# Patient Record
Sex: Male | Born: 1935 | Race: White | Hispanic: No | Marital: Married | Smoking: Never smoker
Health system: Southern US, Community
[De-identification: ages and names within clinical notes are randomized; demographics above are authoritative.]

## PROBLEM LIST (undated history)

## (undated) DIAGNOSIS — I779 Disorder of arteries and arterioles, unspecified: Secondary | ICD-10-CM

## (undated) DIAGNOSIS — I739 Peripheral vascular disease, unspecified: Secondary | ICD-10-CM

## (undated) DIAGNOSIS — R413 Other amnesia: Secondary | ICD-10-CM

## (undated) DIAGNOSIS — G459 Transient cerebral ischemic attack, unspecified: Secondary | ICD-10-CM

## (undated) DIAGNOSIS — M1990 Unspecified osteoarthritis, unspecified site: Secondary | ICD-10-CM

## (undated) DIAGNOSIS — F0390 Unspecified dementia without behavioral disturbance: Secondary | ICD-10-CM

## (undated) DIAGNOSIS — D693 Immune thrombocytopenic purpura: Secondary | ICD-10-CM

## (undated) DIAGNOSIS — I4510 Unspecified right bundle-branch block: Secondary | ICD-10-CM

## (undated) DIAGNOSIS — I10 Essential (primary) hypertension: Secondary | ICD-10-CM

## (undated) DIAGNOSIS — K7460 Unspecified cirrhosis of liver: Secondary | ICD-10-CM

## (undated) DIAGNOSIS — I639 Cerebral infarction, unspecified: Secondary | ICD-10-CM

## (undated) DIAGNOSIS — I219 Acute myocardial infarction, unspecified: Secondary | ICD-10-CM

## (undated) DIAGNOSIS — I2510 Atherosclerotic heart disease of native coronary artery without angina pectoris: Secondary | ICD-10-CM

## (undated) DIAGNOSIS — K219 Gastro-esophageal reflux disease without esophagitis: Secondary | ICD-10-CM

## (undated) DIAGNOSIS — B1920 Unspecified viral hepatitis C without hepatic coma: Secondary | ICD-10-CM

## (undated) DIAGNOSIS — S82201A Unspecified fracture of shaft of right tibia, initial encounter for closed fracture: Secondary | ICD-10-CM

## (undated) DIAGNOSIS — D696 Thrombocytopenia, unspecified: Secondary | ICD-10-CM

## (undated) DIAGNOSIS — E785 Hyperlipidemia, unspecified: Secondary | ICD-10-CM

## (undated) HISTORY — DX: Transient cerebral ischemic attack, unspecified: G45.9

## (undated) HISTORY — PX: JOINT REPLACEMENT: SHX530

## (undated) HISTORY — DX: Thrombocytopenia, unspecified: D69.6

## (undated) HISTORY — DX: Essential (primary) hypertension: I10

## (undated) HISTORY — PX: UPPER GASTROINTESTINAL ENDOSCOPY: SHX188

## (undated) HISTORY — DX: Unspecified right bundle-branch block: I45.10

## (undated) HISTORY — PX: EYE SURGERY: SHX253

## (undated) HISTORY — DX: Atherosclerotic heart disease of native coronary artery without angina pectoris: I25.10

## (undated) HISTORY — DX: Unspecified cirrhosis of liver: K74.60

## (undated) HISTORY — DX: Other amnesia: R41.3

## (undated) HISTORY — DX: Disorder of arteries and arterioles, unspecified: I77.9

## (undated) HISTORY — DX: Gastro-esophageal reflux disease without esophagitis: K21.9

## (undated) HISTORY — DX: Peripheral vascular disease, unspecified: I73.9

## (undated) HISTORY — PX: CORONARY ARTERY BYPASS GRAFT: SHX141

## (undated) HISTORY — DX: Hyperlipidemia, unspecified: E78.5

## (undated) HISTORY — DX: Unspecified viral hepatitis C without hepatic coma: B19.20

## (undated) HISTORY — DX: Immune thrombocytopenic purpura: D69.3

## (undated) HISTORY — DX: Unspecified fracture of shaft of right tibia, initial encounter for closed fracture: S82.201A

---

## 1979-06-12 HISTORY — PX: FRACTURE SURGERY: SHX138

## 1994-06-11 DIAGNOSIS — I219 Acute myocardial infarction, unspecified: Secondary | ICD-10-CM

## 1994-06-11 HISTORY — DX: Acute myocardial infarction, unspecified: I21.9

## 1997-10-18 ENCOUNTER — Encounter: Admission: RE | Admit: 1997-10-18 | Discharge: 1998-01-16 | Payer: Self-pay | Admitting: Radiation Oncology

## 1998-07-29 ENCOUNTER — Encounter: Payer: Self-pay | Admitting: Cardiology

## 1998-07-29 ENCOUNTER — Inpatient Hospital Stay (HOSPITAL_COMMUNITY): Admission: EM | Admit: 1998-07-29 | Discharge: 1998-08-03 | Payer: Self-pay | Admitting: Emergency Medicine

## 1998-08-02 ENCOUNTER — Encounter: Payer: Self-pay | Admitting: Cardiology

## 2001-08-21 ENCOUNTER — Encounter: Admission: RE | Admit: 2001-08-21 | Discharge: 2001-08-21 | Payer: Self-pay | Admitting: Neurosurgery

## 2001-08-21 ENCOUNTER — Encounter: Payer: Self-pay | Admitting: Neurosurgery

## 2001-09-02 ENCOUNTER — Encounter: Payer: Self-pay | Admitting: Neurosurgery

## 2001-09-02 ENCOUNTER — Encounter: Admission: RE | Admit: 2001-09-02 | Discharge: 2001-09-02 | Payer: Self-pay | Admitting: Neurosurgery

## 2001-09-16 ENCOUNTER — Encounter: Admission: RE | Admit: 2001-09-16 | Discharge: 2001-09-16 | Payer: Self-pay | Admitting: Neurosurgery

## 2001-09-16 ENCOUNTER — Encounter: Payer: Self-pay | Admitting: Neurosurgery

## 2002-02-17 ENCOUNTER — Encounter: Payer: Self-pay | Admitting: Emergency Medicine

## 2002-02-17 ENCOUNTER — Inpatient Hospital Stay (HOSPITAL_COMMUNITY): Admission: EM | Admit: 2002-02-17 | Discharge: 2002-02-21 | Payer: Self-pay | Admitting: Emergency Medicine

## 2002-02-20 ENCOUNTER — Encounter: Payer: Self-pay | Admitting: Gastroenterology

## 2002-12-01 ENCOUNTER — Ambulatory Visit (HOSPITAL_COMMUNITY): Admission: RE | Admit: 2002-12-01 | Discharge: 2002-12-01 | Payer: Self-pay | Admitting: Orthopedic Surgery

## 2003-04-30 ENCOUNTER — Other Ambulatory Visit: Payer: Self-pay

## 2004-03-21 ENCOUNTER — Ambulatory Visit: Payer: Self-pay | Admitting: Chiropractor

## 2004-04-10 ENCOUNTER — Ambulatory Visit: Payer: Self-pay | Admitting: Physician Assistant

## 2004-07-04 ENCOUNTER — Inpatient Hospital Stay (HOSPITAL_COMMUNITY): Admission: EM | Admit: 2004-07-04 | Discharge: 2004-07-07 | Payer: Self-pay | Admitting: *Deleted

## 2004-07-04 ENCOUNTER — Ambulatory Visit: Payer: Self-pay | Admitting: Cardiology

## 2004-07-13 ENCOUNTER — Ambulatory Visit: Payer: Self-pay

## 2004-07-20 ENCOUNTER — Ambulatory Visit: Payer: Self-pay | Admitting: Cardiology

## 2004-11-02 ENCOUNTER — Ambulatory Visit: Payer: Self-pay | Admitting: Cardiology

## 2005-04-11 ENCOUNTER — Ambulatory Visit: Payer: Self-pay

## 2005-04-13 ENCOUNTER — Ambulatory Visit: Payer: Self-pay | Admitting: Cardiology

## 2005-05-16 ENCOUNTER — Ambulatory Visit: Payer: Self-pay | Admitting: Cardiology

## 2005-12-10 ENCOUNTER — Ambulatory Visit: Payer: Self-pay | Admitting: Unknown Physician Specialty

## 2006-05-17 ENCOUNTER — Ambulatory Visit: Payer: Self-pay | Admitting: Cardiology

## 2006-05-29 ENCOUNTER — Ambulatory Visit: Payer: Self-pay

## 2006-07-16 ENCOUNTER — Ambulatory Visit: Payer: Self-pay | Admitting: Cardiovascular Disease

## 2006-07-16 ENCOUNTER — Inpatient Hospital Stay (HOSPITAL_COMMUNITY): Admission: EM | Admit: 2006-07-16 | Discharge: 2006-07-19 | Payer: Self-pay | Admitting: Emergency Medicine

## 2006-08-22 ENCOUNTER — Ambulatory Visit: Payer: Self-pay | Admitting: Cardiology

## 2006-11-15 ENCOUNTER — Ambulatory Visit: Payer: Self-pay | Admitting: Cardiology

## 2007-02-26 ENCOUNTER — Ambulatory Visit: Payer: Self-pay | Admitting: Unknown Physician Specialty

## 2007-03-08 ENCOUNTER — Ambulatory Visit: Payer: Self-pay | Admitting: Neurology

## 2007-12-11 ENCOUNTER — Ambulatory Visit: Payer: Self-pay | Admitting: Cardiology

## 2007-12-18 ENCOUNTER — Ambulatory Visit: Payer: Self-pay

## 2008-06-29 ENCOUNTER — Ambulatory Visit: Payer: Self-pay

## 2008-11-09 ENCOUNTER — Ambulatory Visit: Payer: Medicare Other | Admitting: Oncology

## 2008-12-08 ENCOUNTER — Ambulatory Visit: Payer: Medicare Other | Admitting: Oncology

## 2008-12-09 ENCOUNTER — Ambulatory Visit: Payer: Medicare Other | Admitting: Oncology

## 2008-12-26 DIAGNOSIS — E785 Hyperlipidemia, unspecified: Secondary | ICD-10-CM | POA: Insufficient documentation

## 2008-12-26 DIAGNOSIS — I10 Essential (primary) hypertension: Secondary | ICD-10-CM

## 2008-12-26 DIAGNOSIS — I25810 Atherosclerosis of coronary artery bypass graft(s) without angina pectoris: Secondary | ICD-10-CM

## 2008-12-28 ENCOUNTER — Ambulatory Visit: Payer: Self-pay | Admitting: Cardiology

## 2009-01-05 ENCOUNTER — Ambulatory Visit: Payer: Medicare Other | Admitting: Oncology

## 2009-01-09 ENCOUNTER — Ambulatory Visit: Payer: Medicare Other | Admitting: Oncology

## 2009-03-14 ENCOUNTER — Ambulatory Visit: Payer: Self-pay | Admitting: Internal Medicine

## 2009-03-15 ENCOUNTER — Telehealth: Payer: Self-pay | Admitting: Cardiovascular Disease

## 2009-03-18 ENCOUNTER — Ambulatory Visit: Payer: Medicare Other | Admitting: Cardiovascular Disease

## 2009-03-18 ENCOUNTER — Encounter: Payer: Self-pay | Admitting: Cardiology

## 2009-03-18 ENCOUNTER — Ambulatory Visit: Payer: Self-pay | Admitting: Cardiovascular Disease

## 2009-03-18 ENCOUNTER — Telehealth: Payer: Self-pay | Admitting: Internal Medicine

## 2009-03-23 ENCOUNTER — Ambulatory Visit: Payer: Self-pay | Admitting: Cardiology

## 2009-03-23 DIAGNOSIS — R002 Palpitations: Secondary | ICD-10-CM | POA: Insufficient documentation

## 2009-04-04 ENCOUNTER — Ambulatory Visit: Payer: Medicare Other | Admitting: Gastroenterology

## 2009-05-20 ENCOUNTER — Telehealth: Payer: Self-pay | Admitting: Cardiology

## 2009-07-04 ENCOUNTER — Encounter: Payer: Self-pay | Admitting: Cardiology

## 2009-07-04 DIAGNOSIS — I6529 Occlusion and stenosis of unspecified carotid artery: Secondary | ICD-10-CM

## 2009-07-05 ENCOUNTER — Encounter: Payer: Self-pay | Admitting: Cardiology

## 2009-07-05 ENCOUNTER — Ambulatory Visit: Payer: Self-pay

## 2009-07-18 ENCOUNTER — Ambulatory Visit: Payer: Medicare Other | Admitting: Gastroenterology

## 2009-08-08 ENCOUNTER — Encounter: Payer: Self-pay | Admitting: Cardiovascular Disease

## 2009-08-08 LAB — CONVERTED CEMR LAB
ALT: 40 units/L
AST: 40 units/L
BUN: 17 mg/dL
Chloride: 105 meq/L
Cholesterol: 136 mg/dL
Direct LDL: 39.7 mg/dL
GFR calc Af Amer: >60 mL/min
Glucose, Bld: 92 mg/dL
HDL: 39.7 mg/dL
Total Bilirubin: 0.2 mg/dL
Total Protein: 6 g/dL
Triglyceride fasting, serum: 54 mg/dL

## 2009-09-14 ENCOUNTER — Ambulatory Visit: Payer: Self-pay | Admitting: Cardiovascular Disease

## 2009-09-20 ENCOUNTER — Encounter: Payer: Self-pay | Admitting: Cardiovascular Disease

## 2009-12-05 ENCOUNTER — Telehealth: Payer: Self-pay | Admitting: Cardiovascular Disease

## 2010-03-22 ENCOUNTER — Telehealth: Payer: Self-pay | Admitting: Cardiovascular Disease

## 2010-04-10 ENCOUNTER — Ambulatory Visit: Payer: Self-pay | Admitting: Cardiovascular Disease

## 2010-05-19 ENCOUNTER — Telehealth: Payer: Self-pay | Admitting: Cardiovascular Disease

## 2010-07-13 NOTE — Progress Notes (Signed)
Summary: RX   Phone Note Refill Request Call back at Home Phone [phone] Message from:  Patient on May 19, 2010 10:37 AM  Refills Requested: Medication #1:  nitroglycerin .04 mg [facility] ([facility])  Initial call taken by: Harlon Flor,  May 19, 2010 10:39 AM  Follow-up for Phone Call        nitro is not in pt's medication list. is it ok to send prescription. Follow-up by: Lysbeth Galas CMA,  May 19, 2010 10:51 AM  Additional Follow-up for Phone Call Additional follow up Details #1::        ok to prescribe NTG SL     Appended Document: RX pt prescription was faxed in.   Clinical Lists Changes  Medications: Added new medication of NITROSTAT 0.4 MG SUBL (NITROGLYCERIN) take as directed - Signed Rx of NITROSTAT 0.4 MG SUBL (NITROGLYCERIN) take as directed;  #25 x 3;  Signed;  Entered by: Lysbeth Galas CMA;  Authorized by: Dossie Arbour MD;  Method used: Electronically to [facility] [address], [place]  [address], Ph: [phone], Fax: [phone]    Prescriptions: NITROSTAT 0.4 MG SUBL (NITROGLYCERIN) take as directed  #25 x 3   Entered by:   Lysbeth Galas CMA   Authorized by:   Dossie Arbour MD   Signed by:   Lysbeth Galas CMA on 05/22/2010   Method used:   Electronically to        [facility] (mail-order)       [address]       [place], [place]  [address]       Ph: [phone]       Fax: [phone]   RxID:   [id number]

## 2010-07-13 NOTE — Miscellaneous (Signed)
Summary: Orders Update  Clinical Lists Changes  Problems: Added new problem of CAROTID ARTERY DISEASE (ICD-433.10) Orders: Added new Test order of Carotid Duplex (Carotid Duplex) - Signed 

## 2010-07-13 NOTE — Assessment & Plan Note (Signed)
Summary: F6M/AMD  Medications Added ASPIRIN 81 MG TBEC (ASPIRIN) Take 2  tablets  by mouth daily      Allergies Added:   Visit Type:  Follow-up Referring Provider:  wall Primary Provider:  Mariane Masters MD  CC:  Denies chest pain or shortness of breath. Has a lot of bruising and not sure why..  History of Present Illness: Alex [name] is a 77 year old male with coronary artery disease, bypass surgery in 1996 with recent catheterization in October 2010 showing patent grafts with 50% stenosis in the graft to be RCA, normal systolic function who presents for routine followup. he has a history of hepatitis C and liver dysfunction with cirrhosis, low platelets. Also with recent symptoms of mild dementia.  He does have a history of chest fluttering this improved with a higher dose of metoprolol. He does feel more fatigued and has excellent blood pressure at home with occasional heart rates in the low 50s. He has noticed significant bruising over the past several months. It also takes a significant time for his wounds to heal  he does not report any chest pain with exertion or significant shortness of breath.  recent carotid ultrasound study from January 2011 showing stable mild carotid arterial disease, less than 39% bilaterally.  total cholesterol in February of this year was 130, LDL 85, HDL 40  Current Medications (verified): 1)  Lipitor 10 Mg Tabs (Atorvastatin Calcium) .... Take One Tablet By Mouth Daily. 2)  Avapro 75 Mg Tabs (Irbesartan) .... Take One Tablet By Mouth Daily 3)  Fexofenadine Hcl 180 Mg Tabs (Fexofenadine Hcl) .Marland Kitchen.. 1 By Mouth Once Daily 4)  Plavix 75 Mg Tabs (Clopidogrel Bisulfate) .... Take One Tablet By Mouth Daily 5)  Aspirin 81 Mg Tbec (Aspirin) .... Take One Tablet By Mouth Daily 6)  Centrum Silver  Tabs (Multiple Vitamins-Minerals) .Marland Kitchen.. 1 By Mouth Once Daily 7)  Pantoprazole Sodium 40 Mg Tbec (Pantoprazole Sodium) .Marland Kitchen.. 1 By Mouth Once Daily As Needed 8)   Desoximetasone 0.25 % Crea (Desoximetasone) .... As Needed 9)  Fish Oil 1000 Mg Caps (Omega-3 Fatty Acids) .Marland Kitchen.. 1 By Mouth Occasionally 10)  Imodium A-D 2 Mg Tabs (Loperamide Hcl) .Marland Kitchen.. 1 By Mouth Once Daily As Needed 11)  Alprazolam 0.25 Mg Tabs (Alprazolam) .Marland Kitchen.. 1 By Mouth As Needed 12)  Metoprolol Succinate 50 Mg Xr24h-Tab (Metoprolol Succinate) .... Take One Tablet By Mouth Daily 13)  Sertraline Hcl 50 Mg Tabs (Sertraline Hcl) .Marland Kitchen.. 1 By Mouth Once Daily 14)  Flonase 50 Mcg/act Susp (Fluticasone Propionate) .... As Directed  Allergies (verified): 1)  ! Cipro 2)  ! Morphine 3)  ! * Contrast Dye 4)  ! Effexor 5)  ! Aggrenox  Past History:  Past Medical History: Last updated: 03/14/2009 CAD post CABG    --myoview 2009 negative Hyperlipidemia Hypertension RBBB Inactive Hepatitis C Thrombocytopenia GERD Mild carotid disease Memory loss  Past Surgical History: Last updated: 12/26/2008 CABG  Family History: Last updated: 12/26/2008 Negative FH of Diabetes, Hypertension, or Coronary Artery Disease  Social History: Last updated: 12/26/2008 Retired  Married  Tobacco Use - No.  Alcohol Use - yes  Risk Factors: Smoking Status: never (12/26/2008)  Review of Systems  The patient denies fever, weight loss, weight gain, vision loss, decreased hearing, hoarseness, chest pain, syncope, dyspnea on exertion, peripheral edema, prolonged cough, abdominal pain, incontinence, muscle weakness, depression, and enlarged lymph nodes.         brusing, fatigue  Vital Signs:  Patient profile:   76  year old male Height:      68.5 inches Weight:      160.25 pounds BMI:     24.10 Pulse rate:   52 / minute BP sitting:   148 / 85  (left arm) Cuff size:   regular  Vitals Entered By: Alex [name], CMA (April 10, 2010 11:42 AM)  Physical Exam  General:  Well developed, well nourished, in no acute distress. Head:  normocephalic and atraumatic Neck:  Neck supple, no JVD. No masses,  thyromegaly or abnormal cervical nodes. Lungs:  Clear bilaterally to auscultation and percussion. Heart:  Non-displaced PMI, chest non-tender; regular rate and rhythm, S1, S2 without murmurs, rubs or gallops. Carotid upstroke normal, no bruit. Pedals normal pulses. No edema, no varicosities. Abdomen:  Bowel sounds positive; abdomen soft and non-tender without masses Msk:  Back normal, normal gait. Muscle strength and tone normal. Pulses:  pulses normal in all 4 extremities Extremities:  No clubbing or cyanosis. right groin is stable. No bruit Neurologic:  Alert and oriented x 3. Skin:  Intact without lesions or rashes. Psych:  anxious and easily distracted.  anxious and easily distracted.     Impression & Recommendations:  Problem # 1:  CAD, ARTERY BYPASS GRAFT (ICD-414.04) no symptoms of angina. No further workup at this time. We'll continue aggressive medical management. We have suggested that he take 2 baby aspirins and hold his Plavix. He is bruising more, has underlying liver dysfunction. No recent PCI at mandates we continue on Plavix.  The following medications were removed from the medication list:    Plavix 75 Mg Tabs (Clopidogrel bisulfate) .Marland Kitchen... Take one tablet by mouth daily His updated medication list for this problem includes:    Aspirin 81 Mg Tbec (Aspirin) .Marland Kitchen... Take 2  tablets  by mouth daily    Metoprolol Succinate 50 Mg Xr24h-tab (Metoprolol succinate) .Marland Kitchen... Take one tablet by mouth daily  Problem # 2:  HYPERLIPIDEMIA-MIXED (ICD-272.4) Ideally I would like to increase his Lipitor to 20 mg daily and he does have underlying liver dysfunction from his cirrhosis and hepatitis C. I have asked him to discuss this with his hepatologist.  His updated medication list for this problem includes:    Lipitor 10 Mg Tabs (Atorvastatin calcium) .Marland Kitchen... Take one tablet by mouth daily.  Problem # 3:  HYPERTENSION, BENIGN (ICD-401.1) Blood pressure is well controlled on his current  medication regimen. I suggested that if cost is an issue, he could change his Avapro to losartan  His updated medication list for this problem includes:    Avapro 75 Mg Tabs (Irbesartan) .Marland Kitchen... Take one tablet by mouth daily    Aspirin 81 Mg Tbec (Aspirin) .Marland Kitchen... Take 2  tablets  by mouth daily    Metoprolol Succinate 50 Mg Xr24h-tab (Metoprolol succinate) .Marland Kitchen... Take one tablet by mouth daily  Problem # 4:  CAROTID ARTERY DISEASE (ICD-433.10) Mild carotid arterial disease. Continue aggressive lipid management.  The following medications were removed from the medication list:    Plavix 75 Mg Tabs (Clopidogrel bisulfate) .Marland Kitchen... Take one tablet by mouth daily His updated medication list for this problem includes:    Aspirin 81 Mg Tbec (Aspirin) .Marland Kitchen... Take 2  tablets  by mouth daily  Patient Instructions: 1)  Your physician has recommended you make the following change in your medication: STOP Plavix START aspirin 81mg  x2 tabs daily 2)  Your physician wants you to follow-up in: 6 months   You will receive a reminder letter in the mail  two months in advance. If you don't receive a letter, please call our office to schedule the follow-up appointment.  Appended Document: F6M/AMD EKG shows sinus bradycardia with rate 51 beats per minute, right bundle branch block with diffuse T-wave abnormalities

## 2010-07-13 NOTE — Assessment & Plan Note (Signed)
Summary: EC6  Medications Added SERTRALINE HCL 50 MG TABS (SERTRALINE HCL) 1 by mouth once daily FLONASE 50 MCG/ACT SUSP (FLUTICASONE PROPIONATE) as directed      Allergies Added:   Visit Type:  EC6 Referring Provider:  wall Primary Provider:  Mariane Masters MD  CC:  short of breath with exertion, no cp, and some edema in ankles and feet.  History of Present Illness: Alex [name] is a 77 year old male with coronary artery disease, bypass surgery in 1996 with recent catheterization in October 2010 showing patent grafts with 50% stenosis in the graft to be RCA, normal systolic function who presents for routine followup.  Alex [name] states that he used to have fluttering in his chest. His metoprolol was increased from 25-50 mg daily and this improved his fluttering and currently he does not have any symptoms. He does have mild fatigue at baseline though he is very active.  He tells me today that he has been diagnosed with cirrhosis and progression of his underlying liver disease from hepatitis C. He has this followed up at [facility] and is due to follow up with them in the near future for possible treatment.  He also states that he has some mild cognitive deficits though he is doubled to function well and take care of himself. He does report an episode where he was forgetful while driving.  he does not report any chest pain with exertion or significant shortness of breath.  recent carotid ultrasound study from January 2011 showing stable mild carotid arterial disease, less than 39% bilaterally.  Current Problems (verified): 1)  Carotid Artery Disease  (ICD-433.10) 2)  Palpitations  (ICD-785.1) 3)  Chest Tightness-pressure-other  ([id number]) 4)  Hyperlipidemia-mixed  (ICD-272.4) 5)  Hypertension, Benign  (ICD-401.1) 6)  Cad, Artery Bypass Graft  (ICD-414.04)  Current Medications (verified): 1)  Lipitor 10 Mg Tabs (Atorvastatin Calcium) .... Take One Tablet By Mouth Daily. 2)  Avapro 75  Mg Tabs (Irbesartan) .... Take One Tablet By Mouth Daily 3)  Fexofenadine Hcl 180 Mg Tabs (Fexofenadine Hcl) .Marland Kitchen.. 1 By Mouth Once Daily 4)  Plavix 75 Mg Tabs (Clopidogrel Bisulfate) .... Take One Tablet By Mouth Daily 5)  Aspirin 81 Mg Tbec (Aspirin) .... Take One Tablet By Mouth Daily 6)  Centrum Silver  Tabs (Multiple Vitamins-Minerals) .Marland Kitchen.. 1 By Mouth Once Daily 7)  Pantoprazole Sodium 40 Mg Tbec (Pantoprazole Sodium) .Marland Kitchen.. 1 By Mouth Once Daily As Needed 8)  Desoximetasone 0.25 % Crea (Desoximetasone) .... As Needed 9)  Fish Oil 1000 Mg Caps (Omega-3 Fatty Acids) .Marland Kitchen.. 1 By Mouth Occasionally 10)  Imodium A-D 2 Mg Tabs (Loperamide Hcl) .Marland Kitchen.. 1 By Mouth Once Daily As Needed 11)  Alprazolam 0.25 Mg Tabs (Alprazolam) .Marland Kitchen.. 1 By Mouth As Needed 12)  Metoprolol Succinate 50 Mg Xr24h-Tab (Metoprolol Succinate) .... Take One Tablet By Mouth Daily 13)  Sertraline Hcl 50 Mg Tabs (Sertraline Hcl) .Marland Kitchen.. 1 By Mouth Once Daily 14)  Flonase 50 Mcg/act Susp (Fluticasone Propionate) .... As Directed  Allergies (verified): 1)  ! Cipro 2)  ! Morphine 3)  ! * Contrast Dye 4)  ! Effexor 5)  ! Aggrenox  Past History:  Past Medical History: Last updated: 03/14/2009 CAD post CABG    --myoview 2009 negative Hyperlipidemia Hypertension RBBB Inactive Hepatitis C Thrombocytopenia GERD Mild carotid disease Memory loss  Past Surgical History: Last updated: 12/26/2008 CABG  Family History: Last updated: 12/26/2008 Negative FH of Diabetes, Hypertension, or Coronary Artery Disease  Social History: Last updated: 12/26/2008  Retired  Married  Tobacco Use - No.  Alcohol Use - yes  Risk Factors: Smoking Status: never (12/26/2008)  Review of Systems  The patient denies fever, weight loss, weight gain, vision loss, decreased hearing, hoarseness, chest pain, syncope, dyspnea on exertion, peripheral edema, prolonged cough, abdominal pain, incontinence, muscle weakness, depression, and enlarged lymph  nodes.    Vital Signs:  Patient profile:   77 year old male Height:      68.5 inches Weight:      168.50 pounds BMI:     25.34 Pulse rate:   50 / minute Pulse rhythm:   regular BP sitting:   120 / 70  (left arm) Cuff size:   large  Vitals Entered By: Mercer Pod (September 14, 2009 11:31 AM)  Physical Exam  General:  well-appearing elderly male in no apparent distress, HEENT exam is benign, oropharynx is clear, neck is supple with no JVP or carotid bruits, heart sounds are regular with S1-S2 and no murmurs appreciated, lungs are clear to auscultation with no wheezes or rales, abdominal exam is benign, no significant lower extremity edema, neurologic exam is nonfocal, skin is warm and dry. Normal pulses in his upper and lower extremities.    Impression & Recommendations:  Problem # 1:  CAD, ARTERY BYPASS GRAFT (ICD-414.04) history of coronary artery disease, bypass surgery in 1996, recent cardiac catheterization showing moderate disease in one of the vein grafts to the RCA. Medical management recommended.  We have recommended continued aggressive lipid management.  he is not a smoker and currently takes aspirin and Plavix.  The following medications were removed from the medication list:    Metoprolol Succinate 25 Mg Xr24h-tab (Metoprolol succinate) .Marland Kitchen... Take 2 tablet by mouth daily His updated medication list for this problem includes:    Plavix 75 Mg Tabs (Clopidogrel bisulfate) .Marland Kitchen... Take one tablet by mouth daily    Aspirin 81 Mg Tbec (Aspirin) .Marland Kitchen... Take one tablet by mouth daily    Metoprolol Succinate 50 Mg Xr24h-tab (Metoprolol succinate) .Marland Kitchen... Take one tablet by mouth daily  Problem # 2:  HYPERLIPIDEMIA-MIXED (ICD-272.4) Will try to obtain his most recent lipid panel from Dr. Lin Givens Would recommend continuing Lipitor.  His updated medication list for this problem includes:    Lipitor 10 Mg Tabs (Atorvastatin calcium) .Marland Kitchen... Take one tablet by mouth  daily.  Problem # 3:  HYPERTENSION, BENIGN (ICD-401.1) Blood pressure is well controlled on today's visit.  We did to him about his heart rate which is 50. He does have mild fatigue. His beta blocker dose was increased from 25 to 50mg  for palpitations. Have asked him to watch his heart rate at home. If he has worsening bradycardia or symptomatic fatigue, I would decrease his metoprolol to 25 mg daily or possibly 37.5 mg daily.  The following medications were removed from the medication list:    Metoprolol Succinate 25 Mg Xr24h-tab (Metoprolol succinate) .Marland Kitchen... Take 2 tablet by mouth daily His updated medication list for this problem includes:    Avapro 75 Mg Tabs (Irbesartan) .Marland Kitchen... Take one tablet by mouth daily    Aspirin 81 Mg Tbec (Aspirin) .Marland Kitchen... Take one tablet by mouth daily    Metoprolol Succinate 50 Mg Xr24h-tab (Metoprolol succinate) .Marland Kitchen... Take one tablet by mouth daily  Problem # 4:  CHEST TIGHTNESS-PRESSURE-OTHER ([id number]) No recent symptoms of chest tightness with exertion. If he does develop symptoms of angina with exertion, we could try Ranexa.  Patient Instructions: 1)  Your physician recommends  that you schedule a follow-up appointment in: 6 months 2)  Your physician recommends that you continue on your current medications as directed. Please refer to the Current Medication list given to you today.

## 2010-07-13 NOTE — Miscellaneous (Signed)
Summary: lab update  Clinical Lists Changes  Observations: Added new observation of GFRAA: >60 (08/08/2009 13:56) Added new observation of GFR: >60 (08/08/2009 13:56) Added new observation of ALBUMIN: 4.0 g/dL (78/46/9629 52:84) Added new observation of PROTEIN, TOT: 6.0 g/dL (13/24/4010 27:25) Added new observation of CALCIUM: 8.9 mg/dL (36/64/4034 74:25) Added new observation of ALK PHOS: 94 units/L (08/08/2009 13:56) Added new observation of BILI TOTAL: 0.2 mg/dL (95/63/8756 43:32) Added new observation of SGPT (ALT): 40 units/L (08/08/2009 13:56) Added new observation of SGOT (AST): 40 units/L (08/08/2009 13:56) Added new observation of CO2 PLSM/SER: 31.3 meq/L (08/08/2009 13:56) Added new observation of CL SERUM: 105 meq/L (08/08/2009 13:56) Added new observation of K SERUM: 4.6 meq/L (08/08/2009 13:56) Added new observation of NA: 140.9 meq/L (08/08/2009 13:56) Added new observation of CREATININE: 1.1 mg/dL (95/18/8416 60:63) Added new observation of BUN: 17 mg/dL (01/60/1093 23:55) Added new observation of BG RANDOM: 92 mg/dL (73/22/0254 27:06) Added new observation of TSH: 4.755 microintl units/mL (08/08/2009 13:55) Added new observation of TRIGLYCERIDE: 54 mg/dL (23/76/2831 51:76) Added new observation of HDL: 39.7 mg/dL (16/12/3708 62:69) Added new observation of LDL DIR: 39.7 mg/dL (48/54/6270 35:00) Added new observation of LDL: 85.5 mg/dL (93/81/8299 37:16) Added new observation of CHOLESTEROL: 136 mg/dL (96/78/9381 01:75)      -  Date:  08/08/2009    Cholesterol: 136    LDL: 85.5    LDL-direct: 39.7    HDL: 39.7    Triglycerides: 54    TSH: 4.755    BG Random: 92    BUN: 17    Creatinine: 1.1    Sodium: 140.9    Potassium: 4.6    Chloride: 105    CO2 Total: 31.3    SGOT (AST): 40    SGPT (ALT): 40    T. Bilirubin: 0.2    Alk Phos: 94    Calcium: 8.9    Total Protein: 6.0    Albumin: 4.0    GFR(Non African American): >60    GFR(African American)  >60

## 2010-07-13 NOTE — Progress Notes (Signed)
Summary: SAMPLES   Phone Note Call from Patient Call back at Home Phone [phone]   Caller: Patient Call For: [facility] Summary of Call: Needs plavix samples;  Initial call taken by: [place] Carbo,  December 05, 2009 11:10 AM  Follow-up for Phone Call        pt given plavix samples.  Follow-up by: Benedict Needy, RN,  December 05, 2009 2:30 PM     Appended Document: SAMPLES    Clinical Lists Changes  Medications: Changed medication from PLAVIX 75 MG TABS (CLOPIDOGREL BISULFATE) Take one tablet by mouth daily to PLAVIX 75 MG TABS (CLOPIDOGREL BISULFATE) Take one tablet by mouth daily - Signed Rx of PLAVIX 75 MG TABS (CLOPIDOGREL BISULFATE) Take one tablet by mouth daily;  #28 x 0;  Signed;  Entered by: Benedict Needy, RN;  Authorized by: Dossie Arbour MD;  Method used: Samples Given    Prescriptions: PLAVIX 75 MG TABS (CLOPIDOGREL BISULFATE) Take one tablet by mouth daily  #28 x 0   Entered by:   Benedict Needy, RN   Authorized by:   Dossie Arbour MD   Signed by:   Benedict Needy, RN on 12/05/2009   Method used:   Samples Given   RxID:   [id number]

## 2010-07-13 NOTE — Progress Notes (Signed)
Summary: SAMPLES   Phone Note Refill Request Call back at Home Phone [phone] Message from:  Patient on March 22, 2010 3:12 PM  Refills Requested: Medication #1:  PLAVIX 75 MG TABS Take one tablet by mouth daily WOULD LIKE SAMPLES  Initial call taken by: Harlon Flor,  March 22, 2010 3:12 PM  Follow-up for Phone Call        samples available of plavix. patient notified Follow-up by: Bishop Dublin, CMA,  March 22, 2010 4:42 PM

## 2010-08-02 ENCOUNTER — Ambulatory Visit: Payer: Medicare Other | Admitting: Unknown Physician Specialty

## 2010-08-09 ENCOUNTER — Telehealth: Payer: Self-pay | Admitting: Cardiovascular Disease

## 2010-08-17 NOTE — Progress Notes (Signed)
Summary: Avapro and ASA samples  Phone Note Call from Patient   Caller: Patient Call For: Dr Mariah Milling Summary of Call: Pt requesting samples of Avapro 150mg  tablets and ASA 81mg , and Metoprolol.  Advised pt we do not sample Metoprolol.  Left 4 boxes of Avapro 150mg  [id number]/09/12 #28 tablets and 2 boxes of 81mg  ASA at front for pt to pick up.  Pt aware.  Initial call taken by: Cloyde Reams RN,  August 09, 2010 9:14 AM

## 2010-10-02 ENCOUNTER — Telehealth: Payer: Self-pay | Admitting: Cardiovascular Disease

## 2010-10-02 NOTE — Telephone Encounter (Signed)
PLEASE CALL PT REGARDING MEDICATION METOPEROL AND WHAT WAS FILLED. PT SAID THAT [facility] HAS DIFFERENT MED THAN WHAT HE IS USED TO TAKING.

## 2010-10-03 ENCOUNTER — Telehealth: Payer: Self-pay

## 2010-10-03 MED ORDER — METOPROLOL TARTRATE 25 MG PO TABS: 25.0000 mg | ORAL_TABLET | Freq: Two times a day (BID) | ORAL | Status: DC

## 2010-10-03 NOTE — Telephone Encounter (Signed)
I would try the metoprolol tartrate 25 mg po BID, instead of the succinate 50 mg

## 2010-10-03 NOTE — Telephone Encounter (Signed)
Metoprolol Succinate 50 mg one tablet daily is not on his preferred drug list with [facility] but the metoprolol tartrate is.  What do you recommend him take?  Please call back [phone].

## 2010-10-03 NOTE — Telephone Encounter (Signed)
Notified patient Dr. Mariah Milling would like for him to try the Metoprolol tartrate 25 mg take one tablet twice a day. Patient would [facility].

## 2010-10-24 NOTE — Assessment & Plan Note (Signed)
[facility] OFFICE NOTE   NAME:SWAINEdrees, Alex [name]                        MRN:          [id number]  DATE:12/11/2007                            DOB:          1932/07/31    [name] returns today for further management of his cardiac issues.  These include,  1. Coronary artery disease.  He has normal left ventricular systolic      function, patent grafts by cath, July 06, 2004 with a negative      stress Myoview December 2007.  He is currently having no symptoms      of angina, but does have occasional palpitations and dyspnea on      exertion.  2. Hypertension.  3. Hyperlipidemia.  His lipids were at goal except for low HDL of 31.9      by recent checked by Dr. Francia [name], November 14, 2007.   Apparently, she has had lower his metoprolol down to 12.5 b.i.d. from 25  b.i.d. because of slow heart rate and palpitations.  We have talked  about perhaps doing that in his last visit.   He is currently on,  1. Lipitor 10 mg a day.  2. HCTZ 12.5 mg a day.  3. Avapro 75 mg a day.  4. Multivitamin daily.  5. Aspirin 81 mg a day.  6. Protonix.  7. Fexofenadine p.r.n.  8. Plavix 75 mg a day.  9. Metoprolol tartrate 12.5 b.i.d.  10.Citalopram 20 mg daily.  11.Levetiracetam 500 mg p.o. b.i.d.   PHYSICAL EXAMINATION:  Blood pressure today is 114/74, pulse is 53.  HEENT: Normocephalic and atraumatic.  PERRLA.  Extraocular movements  intact.  Sclerae are clear.  Face symmetry is normal.  Carotid upstrokes were equal bilateral without bruits, no JVD.  Thyroid  is not enlarged.  Trachea is midline.  LUNGS: Clear.  HEART: Regular rate and rhythm, slow rate and rhythm.  He has a widely  split S2, soft systolic murmur at the apex.  ABDOMINAL EXAM: Soft with good bowel sounds.  There is no midline bruit.  EXTREMITIES: No cyanosis, clubbing or edema.  Pulses are intact.  SKIN: Some irritation from the electrocardiographic  adhesive patches  over his chest and also over his abdomen.  Apparently, has been  spraining some effective size lately.   EKG shows sinus bradycardia with right bundle with T-wave inversion  across the anterior precordium extending into the lateral leads.  These  may be slightly more prominent in the last EKG.  He has had these before  however.  He weighs 173.   ASSESSMENT/PLAN:  Mr. [name] is doing well.  I am little bit concerned  about the EKG changes being more prominent and his dyspnea as well as  some of the palpitations.  He is due objective assessment of his  coronary disease with a Myoview.  We will arrange this in the next week  or so, off of metoprolol.  Assuming that it is negative for ischemia, I  will see him back again in a year.  I have made no changes  to his  medical program.     Alex Sans. Daleen Squibb, MD, [facility]  Electronically Signed    TCW/MedQ  DD: 12/11/2007  DT: 12/12/2007  Job #: [id number]   cc:   Alex [name], M.D.

## 2010-10-24 NOTE — Assessment & Plan Note (Signed)
[facility] OFFICE NOTE   NAME:Alex [name], Alex [name]                        MRN:          [id number]  DATE:11/15/2006                            DOB:          1933-01-24    Alex [name] returns today for management of the following issues:   1. Coronary artery disease. He has normal left ventricular systolic      function, patent grafts as of cath July 06, 2004, negative      stress Myoview December 2007. He is having some pain in his left      neck that goes up to the back of his head. It gets better with      rubbing it. I assured him this was not cardiac.  2. Hypertension.  3. Hyperlipidemia. Lipids were at goal during his hospitalization      February 2008.   He looks the best I have seen him in some time. His biggest complaint is  he falls asleep easily when he sits down. He seems to be sleeping well  and he seems to be much less depressed and anxious. Dr. Sandria Manly increased  his Keppra and he is wondering if that is the side effect.   Otherwise his medications are unchanged. Please refer to the maintenance  medication list.   PHYSICAL EXAMINATION:  VITAL SIGNS:  His blood pressure is 120/70, his  pulse is 50 and regular. He is in sinus brady of the right bundle which  is stable. He is on low-dose Toprol at 25 mg .  HEENT:  Normocephalic, atraumatic. PERRLA. Extraocular movements intact.  Sclera clear. Facial symmetry is normal. Carotids are full, there is no  bruits. Thyroid is not enlarged, trachea is midline.  LUNGS:  Clear.  HEART:  Reveals a nondisplaced PMI. Normal S1, S2.  ABDOMEN:  Soft with good bowel sounds.  EXTREMITIES:  Reveal no edema. Pulses are intact.  NEUROLOGIC:  Intact.   ASSESSMENT/PLAN:  Alex [name] is doing well from a cardiovascular  standpoint. He will be due a stress Myoview in December. He will need  fasting lipids about that time as well.   His fatigue is probably not from low dose  Toprol. When we stopped this  in the past he has had chest pain and he gets more anxious. I am  reluctant to stop this. If Dr. Sandria Manly feels like his Keppra is not the  problem, we may cut him back further on his Toprol in the future.     Thomas C. Daleen Squibb, MD, [facility]  Electronically Signed   TCW/MedQ  DD: 11/15/2006  DT: 11/15/2006  Job #: [id number]   cc:   Jenkins Rouge, MD

## 2010-10-27 NOTE — H&P (Signed)
NAME:  Alex [name], Alex [name] NO.:  [id number]   MEDICAL RECORD NO.:  [id number]                   PATIENT TYPE:  INP   LOCATION:  1830                                 FACILITY:  [facility]   PHYSICIAN:  Luis Abed, M.D. [facility]           DATE OF BIRTH:  [birth year]   DATE OF ADMISSION:  02/17/2002  DATE OF DISCHARGE:                                HISTORY & PHYSICAL   HISTORY OF PRESENT ILLNESS:  The patient is a pleasant 77 year old gentleman  whose cardiac status is followed by Jesse Sans. Wall, M.D.  The patient has  known coronary disease.  He underwent CABG in 1996 by Alleen Borne, M.D.,  at [facility], receiving a LIMA to the LAD, an SVG to a  marginal (I am not sure if this was a sequential graft or not), SVG to ramus  intermedius, and SVG to the right coronary artery.  The patient was  catheterized last in February of 2000 and at that time the grafts were  patent.   Recently for several weeks he has been having increasing discomfort.  At  times, his left shoulder hurts and this seems to be more compatible with a  musculoskeletal or nerve root pain.  However, he has right subscapular pain  which has now responded to nitroglycerin and the worse episode was this  morning.  Therefore, he is here in the ER.   ALLERGIES:  CIPRO and MORPHINE.   MEDICATIONS:  1. Altace 10 mg.  2. Lipitor 10 mg.  3. Toprol XL 50 mg.  4. Aspirin 81 mg.  5. Protonix 40 mg.  6. Flonase.  7. Foltx.   SOCIAL HISTORY:  [place]  He is a  retired Psychologist, occupational.  He never smoked.  He does not have significant alcohol  intake.   FAMILY HISTORY:  Neither mother or father had early coronary disease.   REVIEW OF SYMPTOMS:  He has no fever or chills.  There are no HEENT  complaints.  There are no skin lesions.  As mentioned, he is having chest  discomfort.  He does have some urinary symptoms.  He has some problems with  anxiety.  There are also some GERD symptoms.  The remainder of his review of  systems is negative.   PHYSICAL EXAMINATION:  VITAL SIGNS:  The temperature is 97.9 degrees, the  heart rate is 60 with respirations of 18, and blood pressure 135/75.  GENERAL APPEARANCE:  He appears in no distress.  HEENT:  No significant abnormalities.  NECK:  No bruits.  CARDIAC:  An S1 with an S2.  He has no significant murmurs.  LUNGS:  Clear.  SKIN:  No rashes.  ABDOMEN:  Soft.  RECTAL:  Deferred.  GENITOURINARY:  Deferred.  EXTREMITIES:  No edema.  NEUROLOGIC:  No focal abnormalities.   LABORATORY DATA:  The EKG  reveals right bundle branch block with some small  inferior Q waves.  The chest x-ray reveals no significant active disease.  The platelet count is low at 63,000 and my understanding is that this is  related to his hepatitis history.  The bilirubin is 1.4.  The creatinine is  0.9.  His first cardiac enzymes are negative.   ALLERGIES:  CIPRO and MORPHINE.   PAST MEDICAL HISTORY:  1. Coronary artery disease, status post CABG in 1996 with grafts patent in     February of 2000.  2. Hyperlipidemia.  3. GERD.  4. Hepatitis C.  5. Arthritis with neck spurs.  6. Some urinary symptoms.  7. Thrombocytopenia that I believe is related to his hepatitis.  8. Right bundle branch block.   The patient will have a urinalysis done for the urinary symptoms.  His  current chest discomfort, as mentioned above, I believe may well be ischemia  in the area of his right subscapular area.  With this in mind, we will be  checking enzymes and we will make plans for cardiac catheterization.                                               Luis Abed, M.D. [facility]    JDK/MEDQ  D:  02/17/2002  T:  02/18/2002  Job:  [id number]   cc:   Bea Graff, M.D.  [facility], [place]

## 2010-10-27 NOTE — Op Note (Signed)
NAME:  Alex [name], Alex [name]                           ACCOUNT NO.:  [id number]   MEDICAL RECORD NO.:  [id number]                   PATIENT TYPE:  AMB   LOCATION:  DAY                                  FACILITY:  [facility]   PHYSICIAN:  Ollen Gross, M.D.                 DATE OF BIRTH:  10-19-1932   DATE OF PROCEDURE:  12/01/2002  DATE OF DISCHARGE:                                 OPERATIVE REPORT   PREOPERATIVE DIAGNOSIS:  Right knee medial meniscal tear.   POSTOPERATIVE DIAGNOSES:  1. Right knee medial meniscal tear.  2. Chondral defect.   PROCEDURE:  Right knee arthroscopy with meniscal debridement and  chondroplasty.   SURGEON:  Gus Rankin. Aluisio, M.D.   ASSISTANT:  None.   ANESTHESIA:  Local with MAC.   ESTIMATED BLOOD LOSS:  Minimal.   DRAIN:  None.   COMPLICATIONS:  None.   CONDITION:  Stable to recovery.   BRIEF CLINICAL NOTE:  Alex [name] is a 77 year old male with progressively  worsening right knee pain and mechanical symptoms.  Exam and history suggest  a meniscal tear confirmed by MRI.  He presents now for arthroscopy and  debridement.   PROCEDURE IN DETAIL:  After the successful administration local with MAC  anesthetic, a tourniquet is placed high on the right thigh, right lower  extremity prepped and draped in the usual sterile fashion. Standard  superior, medial, anterolateral incisions are made with an 11 blade and  inflow cannula passed superomedial and camera passed inferolateral.  Inflow  is initiated and arthroscopic visualization proceeds.  Suprapatellar region  shows no evidence of any synovitis or loose bodies.  The undersurface of the  patella and the trochlea both look completely normal.  Medial and lateral  gutters are visualized, and there is no evidence of any loose bodies or  synovitis there.  Flexion valgus corresponds to the medial knee and the  medial compartment centered.  There was immediately evidence of exposed bone  on the medial femoral  condyle with a chondral flap present.  About half of  this was not covered with any cartilage at all, the other half covered with  the flap.  The entire size was about 2 x 3 cm.  I also visualized the medial  meniscus and a degenerative tear in the body and posterior horn.  A spinal  needle was used to localize the inferomedial portals.  A small incision is  made, dilator placed, and the meniscus is debrided back to a stable base  with a combination of basket and 4.2 shaver.  In addition, we removed the  unstable cartilage from the medial femoral condyle with the shaver and  abraded the surface of the exposed bone.  The intercondylar notch is then  visualized.  ACL appears and probes normally.  Lateral compartment centered,  and that also shows a very tiny focal pinpoint chondral defect, and  I just  debrided the edges of that to a stable cartilaginous base.  At this point,  the arthroscopic equipment is removed from the inferior portals which are  subsequently closed with interrupted 4-0 nylon.  Then 20 mL of 0.25%  Marcaine is injected through the inflow cannula which is subsequently  removed and that portal closed with interrupted 4-0 nylon.  Bulky sterile  dressing is then applied and the patient awakened and transported to  recovery in stable condition.                                               Ollen Gross, M.D.    FA/MEDQ  D:  12/01/2002  T:  12/01/2002  Job:  [id number]

## 2010-10-27 NOTE — Cardiovascular Report (Signed)
NAME:  OZAN, MACLAY NO.:  [id number]   MEDICAL RECORD NO.:  [id number]          PATIENT TYPE:  INP   LOCATION:  2901                         FACILITY:  [facility]   PHYSICIAN:  Salvadore Farber, M.D. LHCDATE OF BIRTH:  1933/04/11   DATE OF PROCEDURE:  10/04/2004  DATE OF DISCHARGE:  07/07/2004                              CARDIAC CATHETERIZATION   PROCEDURE:  Left heart catheterization, left ventriculography, coronary and  bypass graft angiography, Starclose closure of the right common femoral  arteriotomy site.   INDICATIONS:  Mr. Delcarlo is a 77 year old gentleman status post coronary  artery bypass grafting in 1996.  He presented to the emergency room on October 02, 2004 with symptoms concerning for unstable angina.  He ruled out for  myocardial infarction by serial enzymes and electrocardiograms.  Catheterization was delayed while thrombocytopenia was assessed.  It was  eventually felt that it was chronic and related to hepatitis C.  He was then  referred for diagnostic angiography.   PROCEDURAL TECHNIQUE:  Informed consent was obtained.  Under 1% lidocaine  local anesthesia, a 6 French sheath was placed in the right common femoral  artery using the modified Seldinger technique.  Diagnostic angiography and  ventriculography were performed using JL-4, JR-4 catheters for the native  coronaries, JR-4 for the graft, left internal mammary artery for the limb  graft and a pigtail catheter for the left heart catheterization and  ventriculography.  The arteriotomy was then closed using a Starclose device.  Complete hemostasis was obtained.  The patient was then transferred to the  holding room in stable condition having tolerated the procedure well.   COMPLICATIONS:  None.   FINDINGS:  1.  Ejection fraction 65% without regional wall motion abnormality.  There      is no area of stenosis or mitral regurgitation.  2.  Left main:  A 60% stenosis beginning at the ostium  and extending through      the mid portion of the stent.  3.  Left anterior descending:  Moderate sized vessel giving rise to three      relatively small diagonal branches.  The mid vessel has serial 60 and      70% stenoses.  The LIMA to LAD is widely patent.  It was difficult to      see the distal LAD so I cannot definitively exclude disease there as the      culprit for his chest pain.  4.  Circumflex:  Large vessel giving rise to two obtuse marginal's.  The      circumflex is occluded after the take off of the first marginal.  There      is a widely patent graft to the first marginal with excellent distal run      off.  There is also widely patent saphenous vein graft to the second      marginal with excellent distal run off.  5.  Right coronary artery:  Large dominant vessel.  It is occluded in its      mid section after the take off to acute  marginal branches.  There is a      sequential saphenous vein graft to the PDA and PLD which is widely      patent with excellent distal run off.   IMPRESSION/RECOMMENDATIONS:  The patient has widely patent bypass grafts  with excellent distal run off in the circumflex and right coronary artery  territories.  I am unable to see the distal left anterior descending well.  Thus,  I cannot definitively exclude native vessel disease downstream of the graft  as the culprit for his chest pain syndrome.  I therefore recommend that the  patient proceed to exercise Cardiolite test to be performed tomorrow to rule  out any significant ischemia in this territory.      WED/MEDQ  D:  10/05/2004  T:  10/05/2004  Job:  [id number]

## 2010-10-27 NOTE — Consult Note (Signed)
NAME:  HANS, RUSHER NO.:  [id number]   MEDICAL RECORD NO.:  [id number]          PATIENT TYPE:  INP   LOCATION:  2041                         FACILITY:  [facility]   PHYSICIAN:  Melvyn Novas, M.D.  DATE OF BIRTH:  10/22/1932   DATE OF CONSULTATION:  DATE OF DISCHARGE:  07/19/2006                                 CONSULTATION   HISTORY OF PRESENT ILLNESS:  Mr. Rosensteel was seen on 07/16/2006 in an  attempt to finish a neurologic consultation which was unfortunately  interrupted by a code, and was today completed.  This is a 77 year old  Caucasian right-handed gentleman.  Patient is of questionable TIA with  seizures versus panic attacks.  The patient had seen Dr. Sandria Manly in a  single-visit outpatient prior to his admission this time, and he had  presented with repeated spells that consist of dysesthesias that always  affect the left body hemisphere.  He had felt numbness and burning  sensation in his left side of the body, experienced some visual changes,  and during his visit with Dr. Sandria Manly complained about memory loss.  This time his admission took place, since he also experienced chest pain  which did not respond to nitroglycerin.  The patient had been placed  outpatient on Aggrenox, but b.i.d. taking of Aggrenox seemed to have  worsened his symptoms, and Aggrenox was therefore discontinued, and the  patient was now switched to aspirin and Plavix, and admitted for heart  catheterization which returned good results.    Tonight no objective neurologic deficits were seen, and the patient  was started on heparin IV for chest pain and in preparation for the  catheterization.  The heparin has been running continuous.   Review of Dr. Imagene Gurney medical notes from the [facility] office shows a Mini-  Mental test of 27/30, which constitutes mild dementia, migrating and  changing neurologic symptoms that could not be associated with a single  CNS focus.  Today the patient is relieved to hear  that his results of  his catheterization returned with good results, but he is tearful and  anxious, and apparently experienced hallucinations earlier today.    He was concerned about things inside and outside his room, and Dr.  Jeanie Sewer from Psychiatry was involved in the care today.  The patient  appears still to not want his spouse to leave the room to go home.  He  said it gave him an anxiety attack.    He is afraid that he might develop dementia because his mother had  suffered from this neurologic decline, but he also acknowledges that his  mother had multiple other medical problems that might have been  contributing at the time.  He is one of 3 siblings, the only one who has  memory deficits as far as he can say, and he said his mother was one of  5, and none of his uncles had suffered from dementia.  His family has  traits positive for hypertension.  His father died in a car accident.   SOCIAL HISTORY:  The patient is retired, remarried in  a second marriage  for 20 years.  Nonsmoker, nondrinker.   PHYSICAL EXAMINATION:  VITAL SIGNS:  The patient has stable vital signs.  Blood pressure is 135/80.  Pulse rate is 62 and regular.  Temperature  was 98 degrees Fahrenheit.  Respirations 12-14 per minute.  GENERAL:  There are no rales, no rhonchi.  No peripheral edema,  clubbing, cyanosis.  No reddening over the part of the extremity that he  complains of the burning sensation.  HEENT:  Head is normocephalic, atraumatic.  Anicteric.  Eyes show normal extraocular movements, bilaterally equal pupils,  reactive to light.  No facial asymmetry.  Tongue and uvula midline.  No  dysarthria or dysphagia.  No carotid bruits.  No thyromegaly.  MOTOR EXAMINATION:  Shows 5/5 strength in all 4 extremities, with good  grip strength bilaterally.  Full dorsiflexion and plantar flexion of the  feet.  Downgoing toes upon stimulation.  Symmetric deep tendon reflexes.  EXTREMITIES:  Again, the patient  complains of a stinging sensation on  the left antebrachium.  These body dysesthesias are consistent with the  findings he had prior to his admission.  He also stated that they  started 30 minutes after he took his Effexor, which had been  discontinued prior to his admission.  He shows a mild tremor on finger-  to-nose test.  Gait was deferred.   ASSESSMENT:  I do not believe this patient suffers from TIA.  He might  have panic attacks or anxiety attacks, and there seems to be a  personality change, given that his wife noted that he is now very  anxious, restless, sometimes tearful.  He might suffer some major  depression.  I do suggest, however, to stop the Effexor.  I would allow him to  continue the Xanax as an emergency medication.  Since the patient has  hepatitis C, I would not recommend Depakote or phenobarbital as a mood  stabilizing agent, but would like to suggest to use Seroquel which is,  when regularly checked  not hepatotoxic.  At night would like to start a single dose of Keppra, also known as  levetiracetam, and to see if his spells become less frequent.  I told  the patient that I think that the TIA diagnosis is an unlikely one, but  that he might have focal sensory seizures.  I also encouraged a psychiatric followup.      Melvyn Novas, M.D.  Electronically Signed     CD/MEDQ  D:  07/18/2006  T:  07/19/2006  Job:  [id number]   cc:   Genene Churn. Love, M.D.  Antonietta Breach, M.D.

## 2010-10-27 NOTE — Discharge Summary (Signed)
NAME:  Alex [name], Alex [name] NO.:  [id number]   MEDICAL RECORD NO.:  [id number]          PATIENT TYPE:  INP   LOCATION:  2901                         FACILITY:  [facility]   PHYSICIAN:  Rollene Rotunda, M.D.   DATE OF BIRTH:  1933-05-26   DATE OF ADMISSION:  07/04/2004  DATE OF DISCHARGE:  07/07/2004                                 DISCHARGE SUMMARY   DISCHARGE DIAGNOSIS:  1.  Chest pain, etiology unclear.  2.  Coronary artery disease.      1.  Status post coronary artery bypass graft in 1996.      2.  Patent grafts by cardiac catheterization this admission, EF 65%.      3.  Adenosine Myoview this admission negative for ischemia or          infarction.  3.  Transient in hospital confusion - probable sun downing.  4.  Hypertension.  5.  Hyperlipidemia.  6.  Mildly abnormal TSH.  7.  History of inactive hepatitis C.  8.  Mildly elevated liver function tests - question chronic secondary to      history of hepatitis C.  9.  Thrombocytopenia - question chronic.  10. History of right bundle branch block.  11. Osteoarthritis.  12. Gastroesophageal reflux disease.   PROCEDURE PERFORMED:  Cardiac catheterization by Dr. Randa Evens, July 06, 2004, revealing patent LIMA to LAD, vein graft to obtuse marginal, vein  graft to the circumflex, and vein graft to the distal right coronary artery.   HOSPITAL COURSE:  Please see the dictated admission history and physical for  complete details.  Briefly, this 77 year old male patient presented to [facility] emergency room  on July 04, 2004, with symptoms worrisome  for unstable angina pectoris.  His EKG showed no acute changes.  His right  bundle branch block was persistent and he was in sinus rhythm.  He was noted  to have low platelet count at 85,000 and mildly elevated LFTs.  Please see  the lab results below.  On further questioning, the patient notes a history  of chronic hepatitis C that is inactive.   Apparently, he has had elevated  LFTs in the past.  Also, upon questioning, the patient thinks that his  platelets are chronically low.  No further workup was initiated this  admission.  The patient ruled out for myocardial infarction by enzymes.  He  had continued episodes of chest discomfort and on the night of admission,  received Dilaudid for pain.  The patient went for cardiac catheterization on  January 26.  The results are as noted above.  Dr. Samule Ohm was unable to see  the distal LAD so he could not exclude native vessel disease as the culprit  for his pain.  Therefore, the patient was set up for Adenosine Myoview.  This was done on January 27.  Images returned revealing no ischemia or scar  and EF 68%.  From a cardiac standpoint, it was felt the patient was stable  enough for discharge to home.  His rhythm was stable.   Upon discharge,  the patient noted some symptoms of confusion that occurred  on the second morning of admission.  As noted previously, he received  several pain medicines including Dilaudid.  On the morning of January 25, he  noted having some confusion.  He was hallucinating about things inside and  outside his room.  He also noted some difficulty with speaking, however,  upon questioning his wife, his words did make since but it was difficult to  get them out.  The patient felt weak in his upper extremities but there were  no focal deficits.  On exam, the patient had no carotid bruits noted.  His  neurological exam was nonfocal.  It was decided that the patient's symptoms  were probably most consistent with sun downing.  The patient was concerned  about TIAs, but these symptoms were not consistent with this.  We did decide  to go ahead and send the patient home and set  him up with outpatient  carotid Dopplers.  We have asked him to increase his aspirin to 325 mg  daily.   The patient will go home on his usual medications.  His Toprol was increased  to 75 mg a  day for better blood pressure and heart rate control.   LABORATORY DATA:  On January 27, white count 10,600, hemoglobin 13.7,  hematocrit 40.1, platelet count 82,000.  D-dimer less than 0.22.  INR 1.  Sodium 135, potassium 4.3, chloride 103, CO2 28, glucose 131, BUN 23,  creatinine 1.2, total bilirubin 1.6, alkaline phos 92, AST January 24 51,  January 25 42, ALT on January 24 54, January 25 47.  Total protein 6.2,  albumin 3.8, calcium 8.7.  Cardiac enzymes negative as noted above.  Total  cholesterol 132, triglycerides 70, HDL 42, LDL 76.  TSH 5.922.  Admission  chest x-ray showed no acute disease.   DISCHARGE MEDICATIONS:  1.  Lipitor 10 mg daily.  2.  Toprol XL 25 mg 1 1/2 tablets daily.  3.  Avapro 75 mg daily.  4.  Aspirin 325 mg daily.  5.  HCTZ 12.5 mg daily.  6.  Zyrtec.  7.  Protonix 40 mg daily.  8.  Nitroglycerin p.r.n. chest pain.  9.  Flonase.  10. For pain management, Tylenol as needed.   DISCHARGE INSTRUCTIONS:  Activities:  No strenuous activities or driving for  two days, no heavy lifting for one week.  Diet low fat, low sodium.  Wound  care:  The patient should call the office for any groin swelling, bleeding,  or bruising.   FOLLOW UP:  The patient is set up for carotid Dopplers on Thursday, July 13, 2004, at 8:30 a.m. at [facility].  He will see the physicians  assistant on February 9 at 11 a.m.  The patient has been asked to follow up  with Dr. Lin Givens in [facility] for follow up on his elevated LFTs and  thrombocytopenia.  Also, as noted above, his TSH was mildly abnormal.  He  has been asked to follow up with Dr. Lin Givens for this, as well.      SW/MEDQ  D:  07/07/2004  T:  07/07/2004  Job:  [id number]   cc:   Thomas C. Wall, M.D.   Sherryl Barters, M.D.

## 2010-10-27 NOTE — H&P (Signed)
NAME:  Alex [name], Alex [name] NO.:  [id number]   MEDICAL RECORD NO.:  [id number]          PATIENT TYPE:  INP   LOCATION:  1823                         FACILITY:  [facility]   PHYSICIAN:  Noralyn Pick. Eden Emms, MD, FACCDATE OF BIRTH:  11-02-1932   DATE OF ADMISSION:  07/16/2006  DATE OF DISCHARGE:                              HISTORY & PHYSICAL   CARDIOLOGIST:  Dr. Valera Castle.   PRIMARY CARE PHYSICIAN:  Dr. Francia Greaves at the [facility] in  [place].   CHIEF COMPLAINT:  Chest pain.   HISTORY OF PRESENT ILLNESS:  Alex [name] is a 77 year old male patient  followed by Dr. Daleen Squibb with a history of coronary artery disease status  post CABG who was recently evaluated by Neurology for spells that were  suspicious for transient ischemic attacks versus seizure disorder.  Dr.  Sandria Manly saw the patient and placed him on Aggrenox.  After starting the  Aggrenox, the patient developed a burning and stinging sensation that  seemed to coincide with taking the medication that would radiate from  his stomach all the way up to his chest and face and into his arms.  The  patient was supposed to take Aggrenox daily for a couple of weeks and  then go up to twice daily dosing.  He presented to his primary care  physician with these complaints who placed him on Effexor.  He took the  Effexor times 1 dose and had a worsening of his symptoms.  He stopped  the Effexor after this.  He increased his Aggrenox to twice daily  dosing.  His symptoms got worse, and he called Dr. Sandria Manly.  Dr. Sandria Manly told  him to go back to once daily dosing.  In addition to this stinging and  burning sensation, he began to develop chest tightness over the last  couple of weeks.  This would be present at rest.  Exertion would not  make it any worse or bring it on.  He denied any associated shortness of  breath.  It has progressively gotten worse over the last couple of  weeks.  It woke him from sleep today around 6:00 a.m.  It  was the worst  it had been.  He noted some heart fluttering last evening as well.  He  has had this off and on for years now.  He has had about 3 episode in  the last several weeks.  He did note associated nausea and diaphoresis  this morning.  He took nitroglycerin x1.  It gave him some relief for  about 30 minutes and then his pain returned.  He called his primary care  physician who told him to come to the Emergency Room.  Shortly after  arriving in the Emergency Room, his tightness subsided.  He is currently  pain free.   PAST MEDICAL HISTORY:  1. Coronary artery disease status post CABG in 1996.  Cardiac      catheterization in January 2006 revealed an EF of 65% with a patent      LIMA to the LAD, patent vein graft to the  first marginal, patent      vein graft to the second marginal, patent vein graft to the PDA and      PLD.  Medical therapy was continued.  The patient also underwent a      Myoview scan in our office in December 2007.  He says that this was      normal per his report.  2. Good LV function with an EF of 65%.  3. Hypertension.  4. Hyperlipidemia.  5. History of inactive hepatitis C.  6. History of elevated LFTs.  7. History of thrombocytopenia.  8. Right bundle branch block, chronic.  9. Osteoarthritis.  10.Gastroesophageal reflux disease.  11.Carotid Dopplers January 2006 revealing 0 to 39% bilateral ICA      stenosis.  12.Status post right femoral fracture status post ORIF.  13.Status post right knee arthroscopy.   MEDICATIONS:  1. Avapro 75 mg daily.  2. HCTZ 25 mg half a tablet daily.  3. Lipitor 10 mg q.h.s.  4. Toprol XL 25 mg daily.  5. Protonix 40 mg daily.  6. Aggrenox 25/200 mg daily.  7. Multivitamin daily.  8. Fexofenadine 180 mg daily.  9. Aspirin 81 mg daily.   ALLERGIES:  IV DYE, CIPRO, MORPHINE.   SOCIAL HISTORY:  The patient lives in [place] with his wife.  He is a  retired Engineer, production.  He denies any tobacco or alcohol abuse.    FAMILY HISTORY:  Insignificant for coronary artery disease.   REVIEW OF SYSTEMS:  Please see HPI.  Denies any fevers, chills, melena,  hematochezia, hematuria, dysuria.  He denies any cough or hemoptysis.  He has had increased belching as well as waterbrash symptoms since  started on the Aggrenox.  He denies any vomiting or diarrhea.  He does  note an episode of transient right arm pain at bedtime that was  positionally related.  The rest of review of systems are negative.   PHYSICAL EXAMINATION:  GENERAL:  He is a well-nourished, well-developed  male.  VITAL SIGNS:  Blood pressure 143/85, pulse 60, temperature 98.7,  respirations 12.  HEENT:  Head normocephalic atraumatic.  Eyes PERRLA.  EOMI.  Sclerae are  clear.  NECK:  Without JVD or lymphadenopathy.  ENDOCRINE:  Without thyromegaly.  VASCULAR:  Without carotid bruits bilaterally.  Dorsalis pedis and  posterior tibialis pulses 2+ bilaterally.  CARDIAC:  Normal S1, S2.  Regular rate and rhythm without murmurs.  LUNGS:  Clear to auscultation bilaterally without wheezing, rhonchi, or  rales.  SKIN:  Warm and dry.  ABDOMEN:  Soft and nontender with normoactive bowel sounds.  No  organomegaly.  No rebound.  No guarding.  EXTREMITIES:  Without clubbing, cyanosis, or edema.  MUSCULOSKELETAL:  Without joint deformity.  NEUROLOGIC:  He is alert and oriented x3.  Cranial nerves II-XII grossly  intact.   Chest x-ray is currently pending.  EKG reveals sinus rhythm with a heart  rate of 65.  Normal axis.  Right bundle branch block.  T-wave inversions  in 1 and AVL which are new since his previous tracing.  T wave inversion  in V1 through V6 which are more prominent since his previous tracings.   LABORATORY DATA:  White count 6500, hemoglobin 16.1, hematocrit 46.1,  platelet count 88,000, INR 1.1, CK-MB 2.1, troponin I 0.02, sodium 135,  potassium 3.9, glucose 86, BUN 14, creatinine 1, total protein 5.7, albumin 3.7, AST 43, ALT 59.    IMPRESSION:  1. Chest pain.  2. Coronary artery disease,  status post coronary artery bypass      grafting.      a.     Patent grafts January 2006 by catheterization.      b.     Negative Myoview scan December 2007 per patient report.  3. Good left ventricular function.  4. History of recent spells which were concerning for transient      ischemic attacks versus seizures.  Aggrenox started 2-3 weeks ago.  5. Paresthesias.  Question if these are side effects from the      Aggrenox.  6. Hypertension.  7. Hyperlipidemia.  8. Thrombocytopenia.  9. History of elevated liver function tests.  10.Inactive hepatitis C.  11.Osteoarthritis.  12.IV dye allergy.   PLAN:  The patient was also interviewed and examined by Dr. Eden Emms.  Plan to admit him to the hospital.  We will place him on heparin and  nitroglycerin.  We will continue him on his aspirin.  We will hold his  Aggrenox given the suspected side effects as noted above.  We will place  him on Plavix.  We will check serial enzymes.  We have recommended  cardiac catheterization to further define his symptoms.  Risks and  benefits have been discussed with the patient, and he agrees to proceed.  We will proceed with IV dye allergy prophylaxis this evening and  tomorrow morning and proceed with cardiac catheterization tomorrow.  We  will also get Neurology to see the patient regarding possible side  effects from Aggrenox and further recommendations.      Tereso Newcomer, PA-C      Peter C. Eden Emms, MD, [facility]  Electronically Signed    SW/MEDQ  D:  07/16/2006  T:  07/16/2006  Job:  [id number]   cc:   Francia Greaves, M.D.

## 2010-10-27 NOTE — Consult Note (Signed)
NAME:  Alex [name], FODOR NO.:  [id number]   MEDICAL RECORD NO.:  [id number]          PATIENT TYPE:  INP   LOCATION:  2041                         FACILITY:  [facility]   PHYSICIAN:  Antonietta Breach, M.D.  DATE OF BIRTH:  February 02, 1933   DATE OF CONSULTATION:  07/19/2006  DATE OF DISCHARGE:  07/19/2006                                 CONSULTATION   CONTINUATION:   REFERRING PHYSICIAN:  Noralyn Pick. Eden Emms, MD, [facility]   The patient understands the information and would like to proceed with  Celexa with the goal of eliminating the need for Xanax.   The undersigned also informed the patient of the optimal synergism of  combining psychotherapy with medication.  However, he declined  psychotherapy at this time. The undersigned did introduce him to the  common sense nature of cognitive behavioral therapy.   RECOMMENDATIONS:  1. Would start Celexa at 5 mg q.a.m. for 4 days and then would      increase by 5 mg every 4 days as tolerated to 20 mg q.a.m. If the      patient experiences any sedation from the Celexa, would move it to      the night time.  2. Xanax 0.25-0.5 mg b.i.d. p.r.n. acute anxiety. No driving if      drowsy. The goal will be to eliminate the need of Xanax as the      Celexa takes effect.  Of note, maximum benefit from SSRIs will      typically require 75% of the maximum daily dosage, sometimes 100%      of the maximum daily dosage, for optimal anxiety treatment      benefits.  3. If the patient changes his mind about psychotherapy and seeing a      psychiatrist, would call one of the intake departments at one of      the local hospital clinics: [facility], [facility],      or [facility] to enroll the patient in the outpatient psychiatric clinic.   At this point, the patient wants his primary care physician to manage  his psychotropic medications.      Antonietta Breach, M.D.  Electronically Signed     JW/MEDQ  D:  07/20/2006  T:  07/20/2006  Job:   [id number]

## 2010-10-27 NOTE — Consult Note (Signed)
NAME:  DUY, LEMMING NO.:  [id number]   MEDICAL RECORD NO.:  [id number]          PATIENT TYPE:  INP   LOCATION:  2041                         FACILITY:  [facility]   PHYSICIAN:  Antonietta Breach, M.D.  DATE OF BIRTH:  [birth year]   DATE OF CONSULTATION:  07/19/2006  DATE OF DISCHARGE:  07/19/2006                                 CONSULTATION   REQUESTING PHYSICIAN:  Theron Arista C. Eden Emms, MD, [facility].   REASON FOR CONSULTATION:  Severe anxiety.   HISTORY OF PRESENT ILLNESS:  Mr. Bring is a 77 year old male admitted to  the [facility] on July 16, 2006, due to chest pain.   Mr. Humphrey has been having several years of excessive worry, the  associated muscle tension, feeling on edge, and difficulty  concentrating, have worsened in recent weeks.  He has not had any  hallucinations or delusions.  He does maintain a significant level of  interest in activities including church activities.  However, the  anxiety has gotten in the way of him being able to fulfill those  interests. Although he has had some figures of speech, he denies any  suicidal ideation.  He denies any thoughts of harming others.  He has a  normal appetite, when he is not too much on edge.   Over the past several weeks, he has had a number of episodes where he  has become exceptionally anxious and has experienced  derealization.  He  has not experienced any classic full-blown panic attacks.  However, at  times he does feel closed in, as if he needs to get out into an open  space.  Efforts to reinforce his common sense and reason have not helped  him avoid the severe anxiety symptoms.  He has benefited from Xanax  p.r.n. in reducing some of the anxiety symptoms to a tolerable level,  allowing him to engage in some of his interests.   PAST PSYCHIATRIC HISTORY:  No history of suicide attempts, no history of  major depression, no history of mania.  The patient did have some slight  confusion last year  during a general medical admission, which was  assessed as delirium.   The patient describes anxiety symptoms over his adult life span,  including feeling on edge, excessive worry, muscle tension, insomnia,  difficulty concentrating.   The patient recalls a period when he was 77 years old and was working a  very stressful day at work.  He experienced some  derealization and had  to go home, lie down, in order to make it go away.  He states that he  has never had any psychotherapy.   FAMILY PSYCHIATRIC HISTORY:  NONE KNOWN.   SOCIAL HISTORY:  Mr. Staffieri is married to a very supportive wife.  Mr.  Gertsch resides in [place], occupation retired.  This is his second  marriage.  They have had a very supportive marriage for 20 years.  Mr.  Priola does not drink.  He does not smoke.  He does not do any illegal  drugs.   The patient has two siblings.  GENERAL MEDICAL PROBLEMS:  Coronary artery disease, hypertension,  hyperlipidemia, history of inactive Hepatitis C, right bundle branch  block, thrombocytopenia, elevated liver function tests, gastroesophageal  reflux disease, osteoarthritis.  The patient has a history of a right  knee arthroscopy, right femoral fracture, open reduction internal  fixation.   MEDICATIONS:  The MAR is reviewed.  The patient is not on any current  psychotropic medication.   ALLERGIES:  CIPROFLOXACIN, MORPHINE SULFATE, CONTRAST MEDIA.   LABORATORY DATA:  White blood cell count 12.0, hemoglobin 13.4, platelet  count is 81.  His platelets have remained in the 80s, on three complete  blood count over the past week.   INR was 1.1.  Complete metabolic panel shows the SGOT to be 43, SGPT 59,  BUN 14, creatinine 1.0.  His amylase was 55, calcium 9.3, lipase 24,  thyroid stimulating hormone 3.2.   Head CT without contrast on February 5 showed probable small right  external capsule lacunar infarct, mild atrophy, no acute abnormality.   REVIEW OF SYSTEMS:   CONSTITUTIONAL:  Afebrile.  HEAD:  No trauma.  EYES:  No visual changes.  EARS:  No hearing impairment.  NOSE:  No rhinorrhea.  MOUTH/THROAT:  No sore throat.  NEUROLOGIC:  As above.  PSYCHIATRIC:  As  above.  CARDIOVASCULAR:  No chest pain, palpitations or edema.  RESPIRATORY:  No coughing or wheezing.  GASTROINTESTINAL:  No nausea,  vomiting, diarrhea.  GENITOURINARY:  No dysuria.  SKIN:  Unremarkable.  HEMATOLOGIC/LYMPHATIC:  Thrombocytopenia as described above.  Endocrine  metabolic unremarkable.  MUSCULOSKELETAL:  As above.   EXAMINATION:  VITAL SIGNS:  Temperature temperature 98.2, pulse 59,  respirations 18, blood pressure 117/71, O2 saturation on room air 96%.   MENTAL STATUS EXAM:  Mr. Bergen is an elderly male, appearing his  chronologic age, sitting up in his hospital bed in a partially reclined  supine position.  He is oriented completely to all spheres.  His  psychomotor tone is within normal limits.  His affect is mildly anxious  at baseline with a broad appropriate response.  His mood is mildly  anxious.  His memory is intact to immediate recent and remote grossly.  He names 3/3 objects immediately and recalls 2/3 at 5 minutes without  prompting.  His speech involves normal rate and prosody.   REVIEW OF SYSTEMS:  His fund of knowledge and intelligence are within  normal limits.  Thought process logical, coherent, goal-directed.  No  looseness of associations.  Thought content:  No thoughts of harming  himself, no thoughts of harming others, no delusions, no hallucinations.  His concentration is within normal limits.  His insight is good.  His  judgment is intact.  He is well groomed and socially appropriate.   ASSESSMENT:  Axis I:  293.84, anxiety disorder not otherwise specified.  Generalized anxiety disorder.  Axis II:  Deferred.  Axis III:  See general medical problems.  Axis IV:  General medical. Axis V:  55.   Mr. Pellow is not at risk to harm himself or others.   He agrees to call  emergency services immediately for any emergency psychiatric symptoms.   The indications, alternatives and adverse effects of Celexa and Xanax  were discussed with the patient, including the risk of Xanax dependence  and driving while drowsy.  The patient understands the above information  and would like to start Celexa, with the goal of eliminating the need  for Xanax as his Celexa   Dictation ended at  this point.      Antonietta Breach, M.D.  Electronically Signed     JW/MEDQ  D:  07/20/2006  T:  07/20/2006  Job:  [id number]

## 2010-10-27 NOTE — Cardiovascular Report (Signed)
   NAME:  Alex [name], Alex [name]                           ACCOUNT NO.:  [id number]   MEDICAL RECORD NO.:  [id number]                   PATIENT TYPE:  INP   LOCATION:  2041                                 FACILITY:  [facility]   PHYSICIAN:  Noralyn Pick. Eden Emms, M.D. [facility]           DATE OF BIRTH:  Feb 25, 1933   DATE OF PROCEDURE:  DATE OF DISCHARGE:                              CARDIAC CATHETERIZATION   PROCEDURE:  Coronary arteriography.   INDICATION:  CABG in 1996 with recurrent chest pain.   DESCRIPTION OF PROCEDURE:  Standard catheterization was done from the right  femoral artery.  The patient's platelet count was 59,000 at the time of this  study.   RESULTS:  Left main coronary artery had an 80% eccentric stenosis.   Left anterior descending artery was 100% occluded proximally.  The first  diagonal branch had a 50% discrete lesion.   The circumflex coronary artery was 100% occluded proximally.   The right coronary artery was 100% occluded in the mid vessel.   The saphenous vein graft to obtuse marginal branch #1 and 2 is widely  patent.   Saphenous vein graft to the intermediate was widely patent.   Saphenous vein graft to the right coronary artery had a 40% tubular stenosis  proximally.   Left internal mammary artery was not selectively engaged. This is previously  described by Dr. Antoine Poche  in his catheterization in 2000.   However, we did three aortic root injections with a blood pressure cuff  inflated in the left arm. There was clear visualization of the takeoff of  the internal mammary artery down to the mid and distal LAD with good flow  and wide patency.   IMPRESSION:  The patient's chest pain would appear to be noncardiac in  etiology.   PLAN:  He may have an in-hospital upper endoscopy and EGD since he has  symptoms of GERD. He would appear to be at low risk for further cardiac  events.                                                Noralyn Pick. Eden Emms, M.D. [facility]    PCN/MEDQ  D:  02/19/2002  T:  02/20/2002  Job:  [id number]

## 2010-10-27 NOTE — Discharge Summary (Signed)
NAME:  Alex, [name] NO.:  [id number]   MEDICAL RECORD NO.:  [id number]          PATIENT TYPE:  INP   LOCATION:  2041                         FACILITY:  [facility]   PHYSICIAN:  Alex Sans. Wall, MD, FACCDATE OF BIRTH:  April 19, 1933   DATE OF ADMISSION:  07/16/2006  DATE OF DISCHARGE:  07/19/2006                               DISCHARGE SUMMARY   PRIMARY CARDIOLOGIST:  Dr. Valera [name].   PRIMARY CARE PHYSICIAN:  Dr. Francia [name] at the [facility] in  [place].   PRINCIPAL DIAGNOSIS:  Chest pain.   SECONDARY DIAGNOSES:  1. Coronary artery disease status post coronary artery bypass grafting      in 1996.  2. Hypertension.  3. Hyperlipidemia.  4. Anxiety and depression with questionable suicidal ideation.  5. History of inactive hepatitis C.  6. History of elevated liver function tests.  7. History of thrombocytopenia.  8. Chronic right bundle branch block.  9. Osteoarthritis.  10.Gastroesophageal reflux disease.  11.Minimal bilateral internal carotid artery stenosis.  12.Status post right femoral fracture with open reduction internal      fixation.  13.Status post right knee arthroscopy.   ALLERGIES:  IV DYE, CIPRO, AND MORPHINE.   PROCEDURES:  None.   HISTORY OF PRESENT ILLNESS:  A 77 year old male with history of CAD  status post CABG with recent evaluation by neurology for TIAs versus  seizures by Dr. Sandria [name].  He was subsequently placed on Aggrenox about two  weeks ago and shortly thereafter began to experience burning and  sensation extending through his abdomen, chest, and face that worsened  when his Aggrenox dosing was increased to b.i.d.  After discussing it  with Dr. Sandria [name], his dose was then decreased back to daily.  On the day of  admission, the patient woke up with chest pain that was worse than usual  and partially relieved with sublingual nitroglycerin after about 30  minutes.  This prompted him to present to the [facility] ED on  February  5th where he ruled out for MI, but was noted to have new T-wave  inversions in 1 and AVL.  He was admitted for further evaluation.   HOSPITAL COURSE:  The patient was ruled out for MI; however, given ECG  changes consideration was given towards catheterization.  His chest pain  was felt to be noncardiac in nature, however, and we opted not to  perform catheterization.  Shortly after admission, the patient  complained of paresthesias and a head CT was performed showing no acute  abnormalities with a probable small right external capsule lacunar  infarct with mild atrophy.  Neurology was consulted and recommended  discontinuation of Aggrenox in favor of aspirin and Plavix given his  symptoms that he had as an outpatient.  They also recommended the use of  Keppra for possible seizure like activity and this was started.  On  further questioning of the patient and his wife, it was found that he  was having some depressive episodes and possibly even suicidal ideation.  For that reason, psychiatry was consulted, and Dr. Jeanie [name] recommended  initiation  and up titration of Celexa to 20 mg daily after approximately  12 days of titration.  He also recommended p.r.n. Xanax which apparently  has helped the patient with these burning and tingling sensations in the  past.  Alex [name] did not want any additional assistance through Dr.  Jeanie [name], and instead preferred that his outpatient primary care  Alex [name] managed his psychiatric medication.  The patient was therefore  cleared for discharge by psychiatry and will be discharged home today.   DISCHARGE LABORATORIES:  Hemoglobin 13.4, hematocrit 38.1, WBC 12,  platelets 81, MCV 94.9.  Sodium 135, potassium 3.9, chloride 100, CO2  27, BUN 14, creatinine 1, glucose 86.  PT 14.3, INR 1.1, PTT 31.  Total  bilirubin 1.1, alkaline phosphatase 78, AST 43, ALT 59, amylase 55,  lipase 24, albumin 3.7.  Cardiac enzymes negative x3.  Total  cholesterol  124, triglycerides 37, HDL 42, LDL 75.  Calcium 9.3.  D-dimer less than  0.22.  TSH 3.23.   DISPOSITION:  The patient is being discharged home today in good  condition.   FOLLOWUP PLANS AND APPOINTMENTS:  He was asked to follow up with Dr.  Sandria [name] as previously scheduled.  He is asked to follow up with Dr.  Lin [name] as previously scheduled.  He is to follow up with Dr. Valera [name] in our [facility] on March 13th at 10 a.m.   DISCHARGE MEDICATIONS:  Toprol XL 25 mg daily, Avapro 75 mg daily,  aspirin 81 mg daily, Lipitor 10 mg daily, HCTZ 12.5 mg daily, Protonix  40 mg daily, Allegra 180 mg daily, Keppra 250 mg q. p.m., Plavix 75 mg  daily, Celexa 5 mg daily x4 days and 10 mg x4 days then 15 mg x4 days  and then 20 mg as tolerated.  Xanax 0.25 mg b.i.d. p.r.n. anxiety.   OUTSTANDING LABS AND STUDIES:  None.   DURATION OF DISCHARGE ENCOUNTER:  60 minutes including physician time.      Alex [name], ANP      Alex Sans. Daleen Squibb, MD, [facility]  Electronically Signed    CB/MEDQ  D:  07/19/2006  T:  07/20/2006  Job:  [id number]   cc:   Alex Greaves, MD  Genene Churn. Love, M.D.

## 2010-10-27 NOTE — Assessment & Plan Note (Signed)
[facility] OFFICE NOTE   NAME:SWAINRosalie, Gelpi                        MRN:          [id number]  DATE:08/22/2006                            DOB:          10-16-1932    Mr. Rieth returns after being discharged from the hospital on July 19, 2006.  He presented with chest pain and ruled out for MI.  He had  some minor EKG changes.  However, it became apparent that most of his  symptoms were due to depression and anxiety.  He had had a recent  Myoview May 29, 2006 that showed good exercise tolerance, EF of  70%, no sign of scar or ischemia.   He was seen by neurology.  CT of the head showed a probable small right  external capsule lacunar infarct, but no acute stroke.  His Aggrenox was  switched to Plavix and aspirin 325 a day.   He has subsequently been put on Celexa.  He is progressively improving.  He is due a followup with neurology in a couple of weeks.  Hopefully, he  can come off of his Keppra.   1. He is on Lipitor 10 mg with lipids that were at goal during his      hospitalization.  2. Hydrochlorothiazide 12.5 daily.  3. Avapro 75 mg a day.  4. Multivitamin daily.  5. Aspirin 81 mg a day.  6. Protonix 40 mg a day.  7. Fexofenadine 180 mg a day.  8. Toprol XL 25 mg a day.  9. Plavix 75 mg a day.  10.Celexa unknown dose.   He looks remarkably improved.  He is bright and cheerful.  He is alert  and oriented x3.  Blood pressure 122/78, pulse 60 and regular.  His weight is 168.  HEENT:  Normocephalic, atraumatic.  PERRLA.  Extraocular muscles are  intact.  Sclerae clear.  Facial symmetry is normal.  Carotid upstrokes are equal bilaterally without bruits.  There is no  JVD.  Thyroid is not enlarged.  Trachea midline.  LUNGS:  Clear.  HEART:  Regular rate and rhythm without murmur, rub, or gallop.  ABDOMEN:  Soft.  Good bowel sounds.  EXTREMITIES:  No edema.  Pulses were intact.   ASSESSMENT AND  PLAN:  Mr. Esco is doing remarkably better.  I think  most of this was depression.  I will see him back in June.  He will  follow along with neurology as outlined above.     Thomas C. Daleen Squibb, MD, [facility]  Electronically Signed    TCW/MedQ  DD: 08/22/2006  DT: 08/23/2006  Job #: [id number]   cc:   Dr. Francia Greaves

## 2010-10-27 NOTE — Assessment & Plan Note (Signed)
[facility]                            CARDIOLOGY OFFICE NOTE   NAME:SWAINMilbert, Bixler                        MRN:          [id number]  DATE:05/17/2006                            DOB:          March 10, 1933    Mr. Boule comes in today for further management of coronary artery  disease.   He is having no angina or ischemic symptoms.  It has been nearly two  years since his catheterization, which showed patent grafts.   His biggest complaint is that of some visual disturbances in his right  eye.  He also relates an event of getting dizzy and light-headed and  numb on his right side back in April of this year.  He had an evaluation  with a carotid Doppler and CT scan of the head at [facility].  This was apparently negative.  Because of his dialysis, they could not  give him contrast.   The rest of his problems are listed on April 13, 2005.   His medications are unchanged.  Please refer to the medication list.   His blood pressure today is 126/84, his pulse 58 and regular, his weight  is 167.  He is in no acute distress.  SKIN:  Skin is ruddy and warm and dry.  HEENT:  Normocephalic, atraumatic.  PERRLA.  Extraocular movements  intact.  Sclerae are clear.  He wears glasses.  Dentition is  satisfactory.  Facial symmetry is normal.  NECK:  Carotid upstrokes were equal bilaterally, with very soft systolic  sounds.  Thyroid is not enlarged.  Trachea is midline.  LUNGS:  Clear.  HEART:  Reveals a regular rate and rhythm, nondisplaced PMI.  There is a  soft systolic murmur along the left sternal border.  ABDOMINAL EXAM:  Soft, good bowel sounds.  EXTREMITIES:  No cyanosis, clubbing or edema.  Pulses are intact.   EKG is essentially normal, except for his right bundle, which is old,  and lateral T-wave changes, which are old.   I had a long talk with Mr. Quillin and his wife.  I have strongly  recommended a neurology consultation and consideration of  some sort of  enhanced study to look at his arterial cerebral circulation.   I will arrange for him to see Dr. Melbourne Abts, per his request, here in  [place].  In addition, we will do an exercise rest stress Myoview,  off of beta blockers.   He also relates getting problems with dizziness and light-headedness  after he takes his Cialis.  His blood pressure has been low after this.  I have asked him to hold his Avapro on those days.     Thomas C. Daleen Squibb, MD, [facility]  Electronically Signed    TCW/MedQ  DD: 05/17/2006  DT: 05/17/2006  Job #: [id number]   cc:   [place] Francia Greaves, [facility],

## 2010-10-27 NOTE — H&P (Signed)
NAME:  Alex [name], Alex [name] NO.:  [id number]   MEDICAL RECORD NO.:  [id number]          PATIENT TYPE:  INP   LOCATION:  1829                         FACILITY:  [facility]   PHYSICIAN:  Rollene Rotunda, M.D.   DATE OF BIRTH:  1932/11/22   DATE OF ADMISSION:  07/04/2004  DATE OF DISCHARGE:                                HISTORY & PHYSICAL   PRIMARY CARE PHYSICIAN:  Dr. Francia Greaves, [facility].   REASON FOR PRESENTATION:  Evaluate patient with chest pain.   HISTORY OF PRESENT ILLNESS:  The patient is a 77 year old gentleman with a  history of coronary artery disease, status post CABG. His last  catheterization was approximately three years ago. He reports a Cardiolite  in November 2004. He does not report any further cardiovascular testing or  symptoms. He remains relatively active, though in the last few months he has  been limited by orthopedic problem with back pain and status post an  arthroscopic knee surgery. He has reported some fatigue over the last three  months. For the last one month he has had chest tightness. This has been  sporadic. It comes on at rest. He is not sure whether it is similar to  previous angina. On Sunday the patient had a strange feeling in his chest.  It may have been palpitations. It lasted for three to four minutes. It  occurred in Sunday school. He has also noticed over the last few days more  shortness of breath while walking up stairs. He has recently had a cough and  was treated with a Z-Pak. This morning he woke up because of chest  discomfort. This was in his left arm, elbow, and neck. It was approximately  2:30 a.m. He took sublingual nitroglycerin with some improvement. He  presented to the emergency room with 2/10 chest discomfort. He described it  as an aching pressure. He had some discomfort in his left arm. He also had  into his left shoulder. He has not had nausea, vomiting, or diaphoresis. In  the emergency  room the pain has now been relieved. There were diagnostic EKG  changes.   PAST MEDICAL HISTORY:  Coronary artery disease, status post CABG in 1996  (last catheterization in 2003; the left main had 80% stenosis, the LAD was  occluded proximally, the first diagonal had 50% discrete stenosis, the  circumflex was occluded proximally, the right coronary artery was occluded  in the mid vessel, saphenous vein graft to the obtuse marginal sequential to  #1 and #2 was widely patent, saphenous vein graft to the intermediate was  widely patent, saphenous vein graft to the right coronary artery had 40%  tubular stenosis proximally.  The left internal mammary artery was not  selectively engaged, though root injection demonstrated patent flow),  hypertension, hyperlipidemia, right bundle branch block, hepatitis C  inactive, osteoarthritis, and gastroesophageal reflux disease.   PAST SURGICAL HISTORY:  Right femoral bone surgery, right knee surgery.   ALLERGIES:  IV CONTRAST DYE, CIPRO, SULFA, MORPHINE.   MEDICATIONS:  Aspirin 81 mg daily, toprol XL 50 mg daily,  Lipitor 10 mg  daily, Avapro 75 mg daily, Protonix 40 mg daily, hydrochlorothiazide 12.5 mg  daily.   SOCIAL HISTORY:  The patient lives in [place] with his wife. He is a  retired Psychologist, occupational. He never smoked cigarettes. He does not drink alcohol.   FAMILY HISTORY:  Noncontributory for early coronary artery disease.   REVIEW OF SYSTEMS:  As stated in the HPI and positive for upper respiratory  tract infection, back pain, joint pain, reflux, cold intolerance. Negative  for other systems.   PHYSICAL EXAMINATION:  GENERAL: The patient is in no acute distress.  VITAL SIGNS: Blood pressure is 147/85, heart rate 66 and regular, afebrile.  HEENT: Eyes unremarkable. Pupils equal, round, and reactive to light. Fundi  not visualized.  NECK: No jugular venous distention. Waveform within normal limits. Carotid  upstrokes briskly and symmetrical. No  bruits or thyromegaly.  LYMPHATICS: No adenopathy.  LUNGS: Clear to auscultation bilaterally.  BACK: No costovertebral angle tenderness.  CHEST: A well-healed sternotomy scar.  HEART: PMI not displaced or sustained. S1 and S2 within normal limits. No  S3, S4, or murmurs.  ABDOMEN: Flat, positive bowel sounds normal in frequency and pitch. No  bruits, rebound, guarding. No midline pulsatile masses. No organomegaly.  SKIN: No rashes, no nodules.  EXTREMITIES: 2+ pulses. No edema. No cyanosis or clubbing.  NEUROLOGIC: Oriented to person, place, and time. Cranial nerves II-XII  grossly intact. Motor grossly intact.   EKG shows sinus rhythm, rate 70, axis within normal limits, intervals within  normal limits, right bundle branch block.   ASSESSMENT/PLAN:  1.  Chest pain. The patient's chest pain is very worrisome for unstable      angina. He is currently pain free. We will treat him with heparin,      nitrites, aspirin, and continue his beta blocker. He needs cardiac      catheterization. The risks and benefits have been described. I will do      this electively and will pre-treat him for his contrast allergy.  2.  Risk reduction. Will check a lipid profile. Will continue his statins.  3.  Hypertension. Blood pressure will be controlled and the management of      his coronary artery disease.      JH/MEDQ  D:  07/04/2004  T:  07/04/2004  Job:  [id number]   cc:   Francia Greaves, M.D.  [facility]

## 2010-10-27 NOTE — Discharge Summary (Signed)
NAME:  Alex [name], Alex NO.:  [id number]   MEDICAL RECORD NO.:  [id number]                   PATIENT TYPE:  INP   LOCATION:  2041                                 FACILITY:  [facility]   PHYSICIAN:  Alex [name], M.D. [facility]           DATE OF BIRTH:  1932/07/18   DATE OF ADMISSION:  02/17/2002  DATE OF DISCHARGE:  02/21/2002                           DISCHARGE SUMMARY - REFERRING   HISTORY OF PRESENT ILLNESS:  This is a 77 year old male followed by Alex [name]. [name], M.D., for a history of coronary artery disease.  The patient  underwent coronary artery bypass graft surgery in 1996 performed by Alex [name], M.D., at which time he had a LIMA to the LAD, saphenous vein graft  to the marginal, saphenous vein graft to the ramus intermedius, and  saphenous vein graft to the right coronary artery.  The patient underwent  cardiac catheterization in February of 2000, at which time his grafts were  patent.   He presented to [employer] [facility] for evaluation of chest  pain.  He had also been having some left shoulder pain, which Alex [name], M.D., felt was consistent with musculoskeletal or nerve root pain at  the time of admission, although he was having some right subscapular pain  which did respond to nitroglycerin.  He presented to the emergency room at  [facility] and was admitted.   PAST MEDICAL HISTORY:  1. Please see the cardiac history as noted above.  2. History of elevated lipids.  3. History of gastrointestinal reflux disease.  4. History of hepatitis C.  5. History of arthritis.  6. History of renal calculi.  7. History of prostatitis.  8. History of a right femur rod placement.  9. History of normal LV function with an ejection fraction of 60% in the     past.   ALLERGIES:  CIPRO and MORPHINE.   SOCIAL HISTORY:  [place]  He is a  retired Psychologist, occupational.  He never smoked.   He does not have any significant alcohol  intake.  He is married.   HOSPITAL COURSE:  As noted, this patient was admitted to [facility] through the emergency room on February 17, 2002, with  known coronary artery disease and recurrent chest and shoulder pain.  Alex [name], M.D., also felt that the patient was having some symptoms  of anxiety as well.   The patient was noted to have a low platelet count at the time of admission.  His platelet count was 60,000.  Apparently this is an ongoing problem.   The patient underwent cardiac catheterization on February 19, 2002,  performed by Alex [name], M.D.  The patient had an 80% left main  stenosis, 100% LAD, 50% first diagonal, 100% circumflex, and 100% RCA.  The  saphenous vein grafts were all patent, except for the saphenous vein graft  to the RCA which had a 40% proximal lesion.  Please see grafts as noted  above.   A GI consult was obtained secondary to the patient's ongoing symptoms.  The  patient underwent upper endoscopy performed on February 20, 2002, which was  interpreted as a normal procedure.   During his stay, he also developed some testicular pain.  He does have a  history of prostatitis.  He was treated with Septra DS as he is allergic to  Cipro.  He also noted some abdominal fullness and had not had a good bowel  movement in five or six days.  He was given a laxative prior to discharge.  His course was further complicated by hypokalemia, which was supplemented  and improved.  Arrangements were made to discharge the patient on February 21, 2002, in improved condition.   LABORATORY DATA:  On February 20, 2002, a CBC revealed hemoglobin 15.1,  hematocrit 42.8, WBC 5900, and platelets 63,000.  The chemistry profile on  the day of discharge revealed a BUN of 14, creatinine 1.1, potassium 3.6,  and glucose 118.  The patient had mildly elevated liver enzymes.  The AST  was 54, ALT 60, and ALP  normal at 97.  Total bilirubin mildly elevated at  1.4.  Cardiac enzymes were negative x 2.  A lipid profile revealed  cholesterol 141, triglycerides 165, HDL 38, and LDL 70.  An abdominal  ultrasound was unremarkable.  A chest x-ray showed no acute process.  An EKG  showed sinus bradycardia, rate 58 beats per minute with a right bundle  branch block and an old inferior infarct.   DISCHARGE MEDICATIONS:  The patient was discharged on the same medications  he was on prior to admission, which included:  1. Altace 10 mg daily.  2. Lipitor 10 mg daily.  3. Toprol XL 50 mg daily.  4. Aspirin 81 mg daily.  5. Protonix 40 mg daily.  6. Flonase as previously taken.  7. Foltx as previously taken  8. He was also given a prescription for Septra DS one b.i.d. x 14 days.  9. He was told to take Tylenol as needed for pain.   ACTIVITY:  The patient was told to avoid any strenuous activity or driving  for two days.   DIET:  He was to begin a low-salt, low-salt diet.   WOUND CARE:  He was told to call the office if he had any increased pain,  swelling, or bleeding from his groin.   FOLLOW UP:  He was to follow up with Alex [name], M.D., in approximately  two weeks.  He was told to follow up with his primary care physician, Alex [name], M.D., within one week.  Alex [name]. Alex [name], M.D., the  gastroenterologist, would see him on a p.r.n. basis.   PROBLEM LIST AT THE TIME OF DISCHARGE:  1. Chest pain with negative cardiac enzymes.  2. Cardiac catheterization performed on February 19, 2002, revealing     significant native disease, but patent grafts, except for a 40% occlusion     of the saphenous vein graft to the right coronary artery.  3. History of coronary artery bypass graft surgery performed in 1996.  4. History of hyperlipidemia.  5. Gastrointestinal reflux disease.  6. Hepatitis C.  7. Arthritis with neck spurs.  8. History of prostatitis. 9. Testicular pain and possible prostatitis  this admission, treated with  Septra.  10.      Thrombocytopenia.  11.     History of right bundle branch block.  12.      Upper endoscopy performed this admission, found to be normal.  13.      Hypokalemia, supplemented and improved.     Delton See, P.A. [facility]                  Alex [name], M.D. [facility]    DR/MEDQ  D:  02/21/2002  T:  02/23/2002  Job:  [id number]   cc:   Alex [name], M.D.  [facility], [place]

## 2010-11-24 ENCOUNTER — Encounter: Payer: Self-pay | Admitting: Cardiovascular Disease

## 2010-12-15 ENCOUNTER — Ambulatory Visit (INDEPENDENT_AMBULATORY_CARE_PROVIDER_SITE_OTHER): Payer: Medicare Other | Admitting: Cardiovascular Disease

## 2010-12-15 ENCOUNTER — Encounter: Payer: Self-pay | Admitting: Cardiovascular Disease

## 2010-12-15 DIAGNOSIS — I10 Essential (primary) hypertension: Secondary | ICD-10-CM

## 2010-12-15 DIAGNOSIS — I25810 Atherosclerosis of coronary artery bypass graft(s) without angina pectoris: Secondary | ICD-10-CM

## 2010-12-15 DIAGNOSIS — E785 Hyperlipidemia, unspecified: Secondary | ICD-10-CM

## 2010-12-15 DIAGNOSIS — R42 Dizziness and giddiness: Secondary | ICD-10-CM

## 2010-12-15 DIAGNOSIS — I6529 Occlusion and stenosis of unspecified carotid artery: Secondary | ICD-10-CM

## 2010-12-15 DIAGNOSIS — R002 Palpitations: Secondary | ICD-10-CM

## 2010-12-15 MED ORDER — METOPROLOL TARTRATE 25 MG PO TABS: 25.0000 mg | ORAL_TABLET | Freq: Two times a day (BID) | ORAL | Status: DC

## 2010-12-15 NOTE — Assessment & Plan Note (Signed)
Currently with no symptoms of angina. No further workup at this time. Continue current medication regimen. 

## 2010-12-15 NOTE — Assessment & Plan Note (Signed)
Mild carotid arterial disease seen previously. No further ultrasound dated

## 2010-12-15 NOTE — Assessment & Plan Note (Signed)
We will change his Toprol to metoprolol tartrate. We will start 12.5 mg in the evening. We will increase this to 25 mg in the p.m. If he has palpitations.

## 2010-12-15 NOTE — Progress Notes (Signed)
   Patient ID: Alex [name], male    DOB: 10-Jun-1933, 77 y.o.   MRN: [id number]  HPI Comments: Mr Barbier is a 77 year old male with coronary artery disease, bypass surgery in 1996 with recent catheterization in October 2010 showing patent grafts with 50% stenosis in the graft to be RCA, normal systolic function who presents for routine followup. he has a history of hepatitis C and liver dysfunction with cirrhosis, low platelets. Also with recent symptoms of mild dementia. He presents for routine followup   He denies any recent chest fluttering. He was on metoprolol for palpitations that typically occurred at nighttime. On his last clinic visit, we have prescribed metoprolol tartrate. He has still not started this medication one year later. His wife reports that he is more sleepy, he reports having episodes of dizziness. Is not unusual for him to have heart rates in the 40s on his current dose of Toprol.   he does not report any chest pain with exertion or significant shortness of breath.   carotid ultrasound study from January 2011 showing stable mild carotid arterial disease, less than 39% bilaterally.   total cholesterol in February was 130, LDL 85, HDL 40  EKG shows sinus bradycardia with rate 47 beats per minute, right bundle branch block, marked T-wave abnormality in V3 to V6, I and AVL     Review of Systems  Constitutional: Positive for fatigue.  HENT: Negative.   Eyes: Negative.   Respiratory: Negative.   Cardiovascular: Negative.   Gastrointestinal: Negative.   Musculoskeletal: Negative.   Skin: Negative.   Neurological: Negative.   Hematological: Negative.   Psychiatric/Behavioral: Negative.   All other systems reviewed and are negative.     BP 159/84  Pulse 47  Ht 5\' 8"  (1.727 m)  Wt 160 lb 12.8 oz (72.938 kg)  BMI 24.45 kg/m2 Repeat blood pressure was still elevated with systolic pressures in the 150s Physical Exam  Nursing note and vitals reviewed. Constitutional:  He is oriented to person, place, and time. He appears well-developed and well-nourished.  HENT:  Head: Normocephalic.  Nose: Nose normal.  Mouth/Throat: Oropharynx is clear and moist.  Eyes: Conjunctivae are normal. Pupils are equal, round, and reactive to light.  Neck: Normal range of motion. Neck supple. No JVD present.  Cardiovascular: Normal rate, regular rhythm, S1 normal, S2 normal, normal heart sounds and intact distal pulses.  Exam reveals no gallop and no friction rub.   No murmur heard. Pulmonary/Chest: Effort normal and breath sounds normal. No respiratory distress. He has no wheezes. He has no rales. He exhibits no tenderness.  Abdominal: Soft. Bowel sounds are normal. He exhibits no distension. There is no tenderness.  Musculoskeletal: Normal range of motion. He exhibits no edema and no tenderness.  Lymphadenopathy:    He has no cervical adenopathy.  Neurological: He is alert and oriented to person, place, and time. Coordination normal.  Skin: Skin is warm and dry. No rash noted. No erythema.  Psychiatric: He has a normal mood and affect. His behavior is normal. Judgment and thought content normal.           Assessment and Plan

## 2010-12-15 NOTE — Assessment & Plan Note (Signed)
As suggested he stay on his current cholesterol medication

## 2010-12-15 NOTE — Assessment & Plan Note (Signed)
Etiology of his episodes of dizziness is concerning for bradycardia. We will change his metoprolol dose as detailed above. Rest and to monitor his heart rate and blood pressure at home.

## 2010-12-15 NOTE — Assessment & Plan Note (Signed)
Blood pressure continues to be elevated. We've asked him to monitor his blood pressure at home. He reports his blood pressure is typically very well controlled with systolics in the 120 to 130 . We have asked him to either purchased a new blood pressure cuff will bring his own blood pressure cuff to the office for [place] to check this. I've asked him to contact me in the office if his blood pressure continues to be elevated

## 2010-12-15 NOTE — Patient Instructions (Signed)
You are doing well. Please start metoprolol tartrate 1/2 a pill at dinner time If you start to have fluttering, take a full pill. Please call us if you have new issues that need to be addressed before your next appt.  We will call you for a follow up Appt. In 6 months

## 2011-02-16 ENCOUNTER — Telehealth: Payer: Self-pay

## 2011-02-16 NOTE — Telephone Encounter (Signed)
Patient requested samples of avapro 75 mg & aspirin 81 mg.  Gave the patient avapro 150 mg told to cut in half to get the 75 mg tablet. Also gave him samples of aspirin 81 mg tablets.

## 2011-05-01 ENCOUNTER — Telehealth: Payer: Self-pay

## 2011-05-01 MED ORDER — IRBESARTAN 75 MG PO TABS: 75.0000 mg | ORAL_TABLET | Freq: Every day | ORAL | Status: DC

## 2011-05-01 NOTE — Telephone Encounter (Signed)
Refill sent for avapro 75 mg take one tablet daily for a 90 day supply sent to [facility].

## 2011-06-22 ENCOUNTER — Ambulatory Visit: Payer: PRIVATE HEALTH INSURANCE | Admitting: Cardiovascular Disease

## 2011-06-27 ENCOUNTER — Encounter: Payer: Self-pay | Admitting: Cardiovascular Disease

## 2011-06-27 ENCOUNTER — Ambulatory Visit (INDEPENDENT_AMBULATORY_CARE_PROVIDER_SITE_OTHER): Payer: Medicare Other | Admitting: Cardiovascular Disease

## 2011-06-27 DIAGNOSIS — R42 Dizziness and giddiness: Secondary | ICD-10-CM

## 2011-06-27 DIAGNOSIS — I10 Essential (primary) hypertension: Secondary | ICD-10-CM

## 2011-06-27 DIAGNOSIS — I25810 Atherosclerosis of coronary artery bypass graft(s) without angina pectoris: Secondary | ICD-10-CM

## 2011-06-27 DIAGNOSIS — E785 Hyperlipidemia, unspecified: Secondary | ICD-10-CM

## 2011-06-27 DIAGNOSIS — I6529 Occlusion and stenosis of unspecified carotid artery: Secondary | ICD-10-CM

## 2011-06-27 NOTE — Assessment & Plan Note (Signed)
If his cholesterol is not at goal on simvastatin 10 mg daily, we have suggested he could add zetia 10 mg daily.

## 2011-06-27 NOTE — Assessment & Plan Note (Signed)
Currently with no symptoms of angina. No further workup at this time. Continue current medication regimen. Left arm pain is likely atypical in nature. We have suggested we monitor this for now.

## 2011-06-27 NOTE — Patient Instructions (Addendum)
You are doing well. No medication changes were made.  For high blood pressure, double the avapro For dizziness, decrease the metoprolol to 1/2 twice day  Please call us if you have new issues that need to be addressed before your next appt.  Your physician wants you to follow-up in: 6 months.  You will receive a reminder letter in the mail two months in advance. If you don't receive a letter, please call our office to schedule the follow-up appointment.  Consider ZETIA if cholesterol is still too high Goal cholesterol is less than 150

## 2011-06-27 NOTE — Assessment & Plan Note (Addendum)
Blood pressure borderline elevated. We have suggested he monitor his blood pressure at home. No medication changes made. If his blood pressure continues to be elevated, we have suggested he double his irbesartan.

## 2011-06-27 NOTE — Progress Notes (Signed)
Patient ID: Alex [name], male    DOB: 05-18-1933, 78 y.o.   MRN: [id number]  HPI Comments: Alex [name] is a 78 year old male with coronary artery disease, bypass surgery in 1996 with recent catheterization in October 2010 showing patent grafts with 50% stenosis in the graft to the RCA, normal systolic function who presents for routine followup.  history of hepatitis C and liver dysfunction with cirrhosis, low platelets. Also with recent symptoms of mild dementia. He presents for routine followup   He reports rare episodes of dizziness. He continues on metoprolol tartrate 0.5 mg in the morning and 12.5 mg in the evening. He has had episodes of left arm discomfort, typically at rest. He is uncertain if he lifted something heavy, exerted himself or what the reason is for his discomfort. Also with some numbness radiating from the shoulder. Recently it has felt better.   He is also had pain in his right temporalArea that lasts for several minutes at a time, sharp pain.  Lipitor 10 mg was recently changed to simvastatin 10 mg. He was concerned about the liver effects of Lipitor he does not report any chest pain with exertion or significant shortness of breath.   carotid ultrasound study from January 2011 showing stable mild carotid arterial disease, less than 39% bilaterally.   total cholesterol in February was 130, LDL 85, HDL 40 on lipitor 10  EKG shows sinus bradycardia with rate 50 beats per minute, right bundle branch block, marked T-wave abnormality in V3 to V6, I and AVL   Outpatient Encounter Prescriptions as of 06/27/2011  Medication Sig Dispense Refill  . ALPRAZolam (XANAX) 0.25 MG tablet Take 0.25 mg by mouth as needed.        Marland Kitchen aspirin 81 MG EC tablet Take 162 mg by mouth daily.       . fexofenadine (ALLEGRA) 180 MG tablet Take 180 mg by mouth daily.        . fish oil-omega-3 fatty acids 1000 MG capsule Take 1 capsule by mouth.        . fluticasone (FLONASE) 50 MCG/ACT nasal spray  Place 2 sprays into the nose as directed.        . irbesartan (AVAPRO) 75 MG tablet Take 1 tablet (75 mg total) by mouth daily.  90 tablet  3  . memantine (NAMENDA) 10 MG tablet Take 10 mg by mouth 2 (two) times daily.        . metoprolol tartrate (LOPRESSOR) 25 MG tablet Take 25 mg tablet in the am with 12.5 mg in the pm.      . Multiple Vitamins-Minerals (CENTRUM SILVER) tablet Take 1 tablet by mouth daily.        . nitroGLYCERIN (NITROSTAT) 0.4 MG SL tablet Place 0.4 mg under the tongue as directed.        . pantoprazole (PROTONIX) 40 MG tablet Take 40 mg by mouth daily as needed.        . Probiotic Product (ALIGN PO) Take by mouth daily.        . sertraline (ZOLOFT) 50 MG tablet Take 50 mg by mouth daily.        . simvastatin (ZOCOR) 20 MG tablet Take 20 mg by mouth every evening.         Review of Systems  Constitutional: Positive for fatigue.       Rare episodes of pain in his right temporal region  HENT: Negative.   Eyes: Negative.   Respiratory: Negative.  Cardiovascular: Negative.   Gastrointestinal: Negative.   Musculoskeletal: Positive for myalgias.  Skin: Negative.   Neurological: Positive for dizziness.  Hematological: Negative.   Psychiatric/Behavioral: Negative.   All other systems reviewed and are negative.     BP 144/74  Pulse 50  Ht 5\' 9"  (1.753 m)  Wt 162 lb 8 oz (73.71 kg)  BMI 24.00 kg/m2  Physical Exam  Nursing note and vitals reviewed. Constitutional: He is oriented to person, place, and time. He appears well-developed and well-nourished.  HENT:  Head: Normocephalic.  Nose: Nose normal.  Mouth/Throat: Oropharynx is clear and moist.  Eyes: Conjunctivae are normal. Pupils are equal, round, and reactive to light.  Neck: Normal range of motion. Neck supple. No JVD present.  Cardiovascular: Normal rate, regular rhythm, S1 normal, S2 normal, normal heart sounds and intact distal pulses.  Exam reveals no gallop and no friction rub.   No murmur  heard. Pulmonary/Chest: Effort normal and breath sounds normal. No respiratory distress. He has no wheezes. He has no rales. He exhibits no tenderness.  Abdominal: Soft. Bowel sounds are normal. He exhibits no distension. There is no tenderness.  Musculoskeletal: Normal range of motion. He exhibits no edema and no tenderness.  Lymphadenopathy:    He has no cervical adenopathy.  Neurological: He is alert and oriented to person, place, and time. Coordination normal.  Skin: Skin is warm and dry. No rash noted. No erythema.  Psychiatric: He has a normal mood and affect. His behavior is normal. Judgment and thought content normal.           Assessment and Plan

## 2011-06-27 NOTE — Assessment & Plan Note (Signed)
Rare episodes of dizziness. We have suggested he decrease his metoprolol to 12.5 mg b.i.d.

## 2011-07-23 ENCOUNTER — Other Ambulatory Visit: Payer: Self-pay | Admitting: Cardiovascular Disease

## 2011-07-23 MED ORDER — IRBESARTAN 150 MG PO TABS: 150.0000 mg | ORAL_TABLET | Freq: Every day | ORAL | Status: DC

## 2011-07-23 NOTE — Telephone Encounter (Signed)
Pt needs script for 150 mg of the [facility] sent into [facility].

## 2011-07-23 NOTE — Telephone Encounter (Signed)
Refill sent for avapro.

## 2011-07-24 ENCOUNTER — Other Ambulatory Visit: Payer: Self-pay | Admitting: Cardiology

## 2011-07-24 DIAGNOSIS — I6529 Occlusion and stenosis of unspecified carotid artery: Secondary | ICD-10-CM

## 2011-07-26 ENCOUNTER — Encounter (INDEPENDENT_AMBULATORY_CARE_PROVIDER_SITE_OTHER): Payer: Medicare Other | Admitting: *Deleted

## 2011-07-26 DIAGNOSIS — I6529 Occlusion and stenosis of unspecified carotid artery: Secondary | ICD-10-CM

## 2011-07-26 DIAGNOSIS — R42 Dizziness and giddiness: Secondary | ICD-10-CM

## 2011-08-27 ENCOUNTER — Observation Stay: Payer: Self-pay | Admitting: Internal Medicine

## 2011-08-27 DIAGNOSIS — I369 Nonrheumatic tricuspid valve disorder, unspecified: Secondary | ICD-10-CM

## 2011-08-27 LAB — COMPREHENSIVE METABOLIC PANEL
Albumin: 3.8 g/dL (ref 3.4–5.0)
Alkaline Phosphatase: 114 U/L (ref 50–136)
BUN: 16 mg/dL (ref 7–18)
Bilirubin,Total: 0.7 mg/dL (ref 0.2–1.0)
Creatinine: 0.97 mg/dL (ref 0.60–1.30)
EGFR (Non-African Amer.): >60
Glucose: 109 mg/dL — ABNORMAL HIGH (ref 65–99)
SGPT (ALT): 53 U/L
Total Protein: 6.7 g/dL (ref 6.4–8.2)

## 2011-08-27 LAB — CBC
HCT: 43.8 % (ref 40.0–52.0)
MCH: 33.1 pg (ref 26.0–34.0)
Platelet: 61 10*3/uL — ABNORMAL LOW (ref 150–440)
RDW: 13.5 % (ref 11.5–14.5)
WBC: 4.4 10*3/uL (ref 3.8–10.6)

## 2011-08-27 LAB — URINALYSIS, COMPLETE
Bacteria: NONE SEEN
Blood: NEGATIVE
Ketone: NEGATIVE
Nitrite: NEGATIVE
Ph: 7 (ref 4.5–8.0)
RBC,UR: NONE SEEN /HPF (ref 0–5)
Specific Gravity: 1.008 (ref 1.003–1.030)
Squamous Epithelial: NONE SEEN
WBC UR: <1 /HPF (ref 0–5)

## 2011-08-27 LAB — CK TOTAL AND CKMB (NOT AT ARMC)
CK, Total: 82 U/L (ref 35–232)
CK, Total: 71 U/L (ref 35–232)

## 2011-08-27 LAB — TROPONIN I: Troponin-I: <0.02 ng/mL

## 2011-08-28 DIAGNOSIS — R079 Chest pain, unspecified: Secondary | ICD-10-CM

## 2011-08-28 LAB — CBC WITH DIFFERENTIAL/PLATELET
Basophil %: 0.4 %
Eosinophil #: 0.4 10*3/uL (ref 0.0–0.7)
Eosinophil %: 6.5 %
HCT: 40.1 % (ref 40.0–52.0)
Lymphocyte #: 1.6 10*3/uL (ref 1.0–3.6)
Lymphocyte %: 28.8 %
MCH: 33.1 pg (ref 26.0–34.0)
MCHC: 33.9 g/dL (ref 32.0–36.0)
MCV: 98 fL (ref 80–100)
Monocyte #: 0.4 10*3/uL (ref 0.0–0.7)
Monocyte %: 7.7 %
Neutrophil #: 3.2 10*3/uL (ref 1.4–6.5)
Neutrophil %: 56.6 %
Platelet: 59 10*3/uL — ABNORMAL LOW (ref 150–440)
RBC: 4.1 10*6/uL — ABNORMAL LOW (ref 4.40–5.90)
WBC: 5.6 10*3/uL (ref 3.8–10.6)

## 2011-08-28 LAB — BASIC METABOLIC PANEL
Anion Gap: 12 (ref 7–16)
BUN: 18 mg/dL (ref 7–18)
Calcium, Total: 8.5 mg/dL (ref 8.5–10.1)
Chloride: 105 mmol/L (ref 98–107)
EGFR (African American): >60
Glucose: 102 mg/dL — ABNORMAL HIGH (ref 65–99)
Osmolality: 291 (ref 275–301)

## 2011-08-28 LAB — MAGNESIUM: Magnesium: 1.7 mg/dL — ABNORMAL LOW

## 2011-10-03 ENCOUNTER — Emergency Department: Payer: Self-pay | Admitting: Emergency Medicine

## 2011-10-06 ENCOUNTER — Emergency Department: Payer: Self-pay | Admitting: *Deleted

## 2011-10-15 ENCOUNTER — Emergency Department: Payer: Self-pay | Admitting: *Deleted

## 2012-01-07 ENCOUNTER — Encounter: Payer: Self-pay | Admitting: Cardiovascular Disease

## 2012-01-07 ENCOUNTER — Ambulatory Visit (INDEPENDENT_AMBULATORY_CARE_PROVIDER_SITE_OTHER): Payer: Medicare Other | Admitting: Cardiovascular Disease

## 2012-01-07 VITALS — BP 140/80 | HR 47 | Ht 66.0 in | Wt 169.8 lb

## 2012-01-07 DIAGNOSIS — R5381 Other malaise: Secondary | ICD-10-CM

## 2012-01-07 DIAGNOSIS — I10 Essential (primary) hypertension: Secondary | ICD-10-CM

## 2012-01-07 DIAGNOSIS — E785 Hyperlipidemia, unspecified: Secondary | ICD-10-CM

## 2012-01-07 DIAGNOSIS — I25810 Atherosclerosis of coronary artery bypass graft(s) without angina pectoris: Secondary | ICD-10-CM

## 2012-01-07 DIAGNOSIS — R42 Dizziness and giddiness: Secondary | ICD-10-CM

## 2012-01-07 NOTE — Assessment & Plan Note (Signed)
We will hold his beta blocker given his bradycardia to ensure this is not contributing to his symptoms of dizziness, blurry vision, "fogginess".

## 2012-01-07 NOTE — Progress Notes (Signed)
Patient ID: Alex [name], male    DOB: 09/29/1932, 78 y.o.   MRN: [id number]  HPI Comments: Alex [name] is a 78 year old male with coronary artery disease, bypass surgery in 1996 with recent catheterization in October 2010 showing patent grafts with 50% stenosis in the graft to the RCA, normal systolic function who presents for routine followup.  history of hepatitis C and liver dysfunction with cirrhosis, low platelets. Also with recent symptoms of mild dementia. He presents for routine followup   He reports that he has been very reactive but continues to have problems with occasional dizziness, blurry vision, mild shortness of breath with exertion when he pushes himself hard. His wife reports that he has been working very hard in the heat, and she tries to keep him hydrated. His sleep is good.  He reports recent EGD showing mild proximal esophageal varices. Also a diagnosis of cirrhosis from years ago He was started on a medication for hepatic encephalopathy given recent confusion and "fogginess",.  He continues to have no lower edema. Heart rate continues to run very low.  tolerating simvastatin 10 mg. Continued problems with memory.  he does not report any chest pain with exertion    carotid ultrasound study from January 2011 showing stable mild carotid arterial disease, less than 39% bilaterally.   total cholesterol in February was 130, LDL 85, HDL 40 on lipitor 10  EKG shows sinus bradycardia with rate 47 beats per minute, right bundle branch block, marked T-wave abnormality in V3 to V6, I and AVL, II and AVF  Outpatient Encounter Prescriptions as of 01/07/2012  Medication Sig Dispense Refill  . aspirin 81 MG EC tablet Take 162 mg by mouth daily.       . irbesartan (AVAPRO) 150 MG tablet Take 1 tablet (150 mg total) by mouth daily.  90 tablet  3  . isosorbide mononitrate (IMDUR) 120 MG 24 hr tablet Take 120 mg by mouth daily. He is taking 90 mg pill (1 1/2 daily)      . memantine  (NAMENDA) 10 MG tablet Take 10 mg by mouth 2 (two) times daily.        . metoprolol tartrate (LOPRESSOR) 25 MG tablet 12.5 mg 2 (two) times daily.       . Multiple Vitamins-Minerals (CENTRUM SILVER) tablet Take 1 tablet by mouth daily.        . nitroGLYCERIN (NITROSTAT) 0.4 MG SL tablet Place 0.4 mg under the tongue as directed.        . pantoprazole (PROTONIX) 40 MG tablet Take 40 mg by mouth daily.       . rifaximin (XIFAXAN) 550 MG TABS Take 550 mg by mouth 2 (two) times daily.      . sertraline (ZOLOFT) 25 MG tablet Take 25 mg by mouth daily.      . simvastatin (ZOCOR) 10 MG tablet Take 10 mg by mouth daily.        Review of Systems  Constitutional: Positive for fatigue.       Rare episodes of pain in his right temporal region  HENT: Negative.   Eyes: Negative.   Respiratory: Negative.   Cardiovascular: Negative.   Gastrointestinal: Negative.   Musculoskeletal: Positive for myalgias.  Skin: Negative.   Neurological: Positive for dizziness.  Hematological: Negative.   Psychiatric/Behavioral: Positive for confusion and decreased concentration.        Poor memory  All other systems reviewed and are negative.     BP 140/80  Pulse  47  Ht 5\' 6"  (1.676 m)  Wt 169 lb 12 oz (76.998 kg)  BMI 27.40 kg/m2  Physical Exam  Nursing note and vitals reviewed. Constitutional: He is oriented to person, place, and time. He appears well-developed and well-nourished.  HENT:  Head: Normocephalic.  Nose: Nose normal.  Mouth/Throat: Oropharynx is clear and moist.  Eyes: Conjunctivae are normal. Pupils are equal, round, and reactive to light.  Neck: Normal range of motion. Neck supple. No JVD present.  Cardiovascular: Normal rate, regular rhythm, S1 normal, S2 normal, normal heart sounds and intact distal pulses.  Exam reveals no gallop and no friction rub.   No murmur heard. Pulmonary/Chest: Effort normal and breath sounds normal. No respiratory distress. He has no wheezes. He has no rales.  He exhibits no tenderness.  Abdominal: Soft. Bowel sounds are normal. He exhibits no distension. There is no tenderness.  Musculoskeletal: Normal range of motion. He exhibits no edema and no tenderness.  Lymphadenopathy:    He has no cervical adenopathy.  Neurological: He is alert and oriented to person, place, and time. Coordination normal.  Skin: Skin is warm and dry. No rash noted. No erythema.  Psychiatric: He has a normal mood and affect. His behavior is normal. Judgment and thought content normal.           Assessment and Plan

## 2012-01-07 NOTE — Assessment & Plan Note (Signed)
Currently with no symptoms of angina. No further workup at this time. Continue current medication regimen. 

## 2012-01-07 NOTE — Assessment & Plan Note (Signed)
He has suggested he closely monitor his blood pressure when holding metoprolol. If he feels better without metoprolol, alternate medication could be used if needed for systolic blood pressure greater than 140.

## 2012-01-07 NOTE — Addendum Note (Signed)
Addended by: Festus Aloe on: 01/07/2012 03:55 PM   Modules accepted: Orders

## 2012-01-07 NOTE — Assessment & Plan Note (Signed)
Cholesterol is at goal on the current lipid regimen. No changes to the medications were made.  

## 2012-01-07 NOTE — Assessment & Plan Note (Signed)
Will hold metoprolol for a few week trial period.

## 2012-01-07 NOTE — Patient Instructions (Addendum)
You are doing well. Please hold the metoprolol for now for slow heart rate  If no improvement in symptoms, hold simvastatin for two weeks to see if symptoms improve  Please call us if you have new issues that need to be addressed before your next appt.  Your physician wants you to follow-up in: 3 months.  You will receive a reminder letter in the mail two months in advance. If you don't receive a letter, please call our office to schedule the follow-up appointment.

## 2012-01-24 ENCOUNTER — Telehealth: Payer: Self-pay | Admitting: Cardiovascular Disease

## 2012-01-24 NOTE — Telephone Encounter (Signed)
Pt dropped of bp readings and was wondering if there needed to be any changes to [employer]

## 2012-01-25 NOTE — Telephone Encounter (Signed)
Notified patient's wife per Dr. Mariah Milling the blood pressure numbers look adequate for now and to continue on current medications with managing blood pressure for two weeks and bring back the readings to view again.

## 2012-01-25 NOTE — Telephone Encounter (Signed)
Patient was told to bring in blood pressure readings. The blood pressure range from 135/87 HR 47 to 152/83 HR 83 in the am with pm blood pressure range from 145/55 HR 64, 166/62 HR of 63. Blood pressure readings in Dr. Windell Hummingbird folder to review. Please advise.

## 2012-01-25 NOTE — Telephone Encounter (Signed)
Numbers look adequate for now Would continue his current medications Continue to monitor blood pressure They are in a reasonable range

## 2012-04-11 ENCOUNTER — Ambulatory Visit: Payer: Medicare Other | Admitting: Cardiovascular Disease

## 2012-04-14 ENCOUNTER — Encounter: Payer: Self-pay | Admitting: Cardiovascular Disease

## 2012-04-14 ENCOUNTER — Ambulatory Visit (INDEPENDENT_AMBULATORY_CARE_PROVIDER_SITE_OTHER): Payer: Medicare Other | Admitting: Cardiovascular Disease

## 2012-04-14 VITALS — BP 142/70 | HR 60 | Ht 69.0 in | Wt 159.5 lb

## 2012-04-14 DIAGNOSIS — R42 Dizziness and giddiness: Secondary | ICD-10-CM

## 2012-04-14 DIAGNOSIS — I25810 Atherosclerosis of coronary artery bypass graft(s) without angina pectoris: Secondary | ICD-10-CM

## 2012-04-14 DIAGNOSIS — E785 Hyperlipidemia, unspecified: Secondary | ICD-10-CM

## 2012-04-14 DIAGNOSIS — I4902 Ventricular flutter: Secondary | ICD-10-CM

## 2012-04-14 DIAGNOSIS — I10 Essential (primary) hypertension: Secondary | ICD-10-CM

## 2012-04-14 DIAGNOSIS — I498 Other specified cardiac arrhythmias: Secondary | ICD-10-CM

## 2012-04-14 NOTE — Patient Instructions (Addendum)
You are doing well. No medication changes were made.  Please call us if you have new issues that need to be addressed before your next appt.  Your physician wants you to follow-up in: 12 months.  You will receive a reminder letter in the mail two months in advance. If you don't receive a letter, please call our office to schedule the follow-up appointment. 

## 2012-04-14 NOTE — Progress Notes (Signed)
Patient ID: Alex [name], male    DOB: [birth year], 78 y.o.   MRN: [id number]  HPI Comments: [name] is a 78 year old male with coronary artery disease, bypass surgery in 1996 with recent catheterization in October 2010 showing patent grafts with 50% stenosis in the graft to the RCA, normal systolic function,  history of hepatitis C and liver dysfunction with cirrhosis, low platelets. Also with recent symptoms of mild dementia. He presents for routine followup   He reports that he has been very active but continues to have problems with occasional dizziness, blurry vision.  His sleep is good. He is most concerned about his memory. He does not do regular exercise, does not to mental stimulation games.he does not report any chest pain with exertion. He does have occasional low blood pressure, not recently. In the exam room today, he reports being dizzy but blood pressures in the 130 range systolic. He no longer takes metoprolol. Heart rate in the 60-80 range.  Previous EGD showing mild proximal esophageal varices. Also a diagnosis of cirrhosis from years ago He was started on a medication for hepatic encephalopathy given recent confusion and "fogginess",.  He continues to have no lower edema.   tolerating simvastatin 10 mg.  carotid ultrasound study from January 2011 showing stable mild carotid arterial disease, less than 39% bilaterally.   EKG shows sinus bradycardia with rate 60 beats per minute, right bundle branch block, marked T-wave abnormality in V1 to V6, I and AVL  Outpatient Encounter Prescriptions as of 04/14/2012  Medication Sig Dispense Refill  . aspirin 81 MG EC tablet Take 162 mg by mouth daily.       . irbesartan (AVAPRO) 150 MG tablet Take 1 tablet (150 mg total) by mouth daily.  90 tablet  3  . memantine (NAMENDA) 10 MG tablet Take 10 mg by mouth 2 (two) times daily.        . Multiple Vitamins-Minerals (CENTRUM SILVER) tablet Take 1 tablet by mouth daily.        . nitroGLYCERIN  (NITROSTAT) 0.4 MG SL tablet Place 0.4 mg under the tongue as directed.        . pantoprazole (PROTONIX) 40 MG tablet Take 40 mg by mouth daily.       . rifaximin (XIFAXAN) 550 MG TABS Take 550 mg by mouth 2 (two) times daily.      . sertraline (ZOLOFT) 25 MG tablet Take 25 mg by mouth daily.      . simvastatin (ZOCOR) 10 MG tablet Take 10 mg by mouth daily.      . [DISCONTINUED] metoprolol tartrate (LOPRESSOR) 25 MG tablet 12.5 mg 2 (two) times daily.          Review of Systems  Constitutional:       Rare episodes of pain in his right temporal region  HENT: Negative.   Eyes: Negative.   Respiratory: Negative.   Cardiovascular: Negative.   Gastrointestinal: Negative.   Musculoskeletal: Positive for myalgias.  Skin: Negative.   Neurological: Positive for dizziness.  Hematological: Negative.   Psychiatric/Behavioral: Positive for decreased concentration.        Poor memory  All other systems reviewed and are negative.     BP 142/70  Pulse 60  Ht 5\' 9"  (1.753 m)  Wt 159 lb 8 oz (72.349 kg)  BMI 23.55 kg/m2  Physical Exam  Nursing note and vitals reviewed. Constitutional: He is oriented to person, place, and time. He appears well-developed and well-nourished.  HENT:  Head: Normocephalic.  Nose: Nose normal.  Mouth/Throat: Oropharynx is clear and moist.  Eyes: Conjunctivae normal are normal. Pupils are equal, round, and reactive to light.  Neck: Normal range of motion. Neck supple. No JVD present.  Cardiovascular: Normal rate, regular rhythm, S1 normal, S2 normal, normal heart sounds and intact distal pulses.  Exam reveals no gallop and no friction rub.   No murmur heard. Pulmonary/Chest: Effort normal and breath sounds normal. No respiratory distress. He has no wheezes. He has no rales. He exhibits no tenderness.  Abdominal: Soft. Bowel sounds are normal. He exhibits no distension. There is no tenderness.  Musculoskeletal: Normal range of motion. He exhibits no edema and no  tenderness.  Lymphadenopathy:    He has no cervical adenopathy.  Neurological: He is alert and oriented to person, place, and time. Coordination normal.  Skin: Skin is warm and dry. No rash noted. No erythema.  Psychiatric: He has a normal mood and affect. His behavior is normal. Judgment and thought content normal.           Assessment and Plan

## 2012-04-14 NOTE — Assessment & Plan Note (Addendum)
Blood pressure is well controlled on today's visit. No changes made to the medications. Rare low blood pressure. I've asked him to monitor closely when he does get these low numbers. Do Not think his dizziness episodes is from low blood pressure.

## 2012-04-14 NOTE — Assessment & Plan Note (Signed)
Currently with no symptoms of angina. No further workup at this time. Continue current medication regimen. 

## 2012-04-14 NOTE — Assessment & Plan Note (Signed)
LDL slightly above goal. Otherwise total cholesterol has a good level.

## 2012-04-14 NOTE — Assessment & Plan Note (Signed)
Likely unrelated to cardiac issues. Possibly secondary to neurologic.

## 2012-06-11 DIAGNOSIS — S82201A Unspecified fracture of shaft of right tibia, initial encounter for closed fracture: Secondary | ICD-10-CM

## 2012-06-11 HISTORY — DX: Unspecified fracture of shaft of right tibia, initial encounter for closed fracture: S82.201A

## 2012-06-11 HISTORY — PX: TIBIA FRACTURE SURGERY: SHX806

## 2012-06-11 HISTORY — PX: LEG SURGERY: SHX1003

## 2012-08-17 ENCOUNTER — Observation Stay: Payer: Self-pay | Admitting: Internal Medicine

## 2012-08-17 LAB — CBC WITH DIFFERENTIAL/PLATELET
Basophil #: 0 10*3/uL (ref 0.0–0.1)
Basophil %: 0.5 %
Eosinophil %: 4.8 %
Lymphocyte #: 1.5 10*3/uL (ref 1.0–3.6)
Lymphocyte %: 26.1 %
MCH: 34.4 pg — ABNORMAL HIGH (ref 26.0–34.0)
MCHC: 36.8 g/dL — ABNORMAL HIGH (ref 32.0–36.0)
MCV: 93 fL (ref 80–100)
Monocyte #: 0.4 x10 3/mm (ref 0.2–1.0)
Neutrophil %: 61 %
Platelet: 70 10*3/uL — ABNORMAL LOW (ref 150–440)
WBC: 5.6 10*3/uL (ref 3.8–10.6)

## 2012-08-17 LAB — COMPREHENSIVE METABOLIC PANEL
Alkaline Phosphatase: 148 U/L — ABNORMAL HIGH (ref 50–136)
Bilirubin,Total: 0.9 mg/dL (ref 0.2–1.0)
Co2: 27 mmol/L (ref 21–32)
EGFR (African American): >60
Osmolality: 269 (ref 275–301)
Potassium: 3.8 mmol/L (ref 3.5–5.1)
Total Protein: 7.3 g/dL (ref 6.4–8.2)

## 2012-08-17 LAB — TROPONIN I: Troponin-I: 0.04 ng/mL

## 2012-08-18 DIAGNOSIS — R072 Precordial pain: Secondary | ICD-10-CM

## 2012-08-18 LAB — TROPONIN I: Troponin-I: 0.03 ng/mL

## 2013-04-04 ENCOUNTER — Inpatient Hospital Stay: Payer: Self-pay | Admitting: Specialist

## 2013-04-04 LAB — BASIC METABOLIC PANEL
Calcium, Total: 8.7 mg/dL (ref 8.5–10.1)
Co2: 30 mmol/L (ref 21–32)
Creatinine: 1.1 mg/dL (ref 0.60–1.30)
Glucose: 111 mg/dL — ABNORMAL HIGH (ref 65–99)
Osmolality: 274 (ref 275–301)
Potassium: 4 mmol/L (ref 3.5–5.1)

## 2013-04-04 LAB — CBC WITH DIFFERENTIAL/PLATELET
Basophil #: 0 10*3/uL (ref 0.0–0.1)
Basophil %: 0.3 %
HCT: 39 % — ABNORMAL LOW (ref 40.0–52.0)
Lymphocyte #: 0.7 10*3/uL — ABNORMAL LOW (ref 1.0–3.6)
Lymphocyte %: 10.9 %
MCH: 32 pg (ref 26.0–34.0)
MCHC: 35.4 g/dL (ref 32.0–36.0)
Neutrophil #: 5.4 10*3/uL (ref 1.4–6.5)
RBC: 4.32 10*6/uL — ABNORMAL LOW (ref 4.40–5.90)
RDW: 15.1 % — ABNORMAL HIGH (ref 11.5–14.5)
WBC: 6.8 10*3/uL (ref 3.8–10.6)

## 2013-04-04 LAB — URINALYSIS, COMPLETE
Bacteria: NONE SEEN
Blood: NEGATIVE
Ketone: NEGATIVE
Nitrite: NEGATIVE
Ph: 8 (ref 4.5–8.0)
Protein: NEGATIVE
Specific Gravity: 1.008 (ref 1.003–1.030)
Squamous Epithelial: NONE SEEN

## 2013-04-04 LAB — PROTIME-INR
INR: 1.1
Prothrombin Time: 14.5 secs (ref 11.5–14.7)

## 2013-04-05 LAB — HEMOGLOBIN: HGB: 10.9 g/dL — ABNORMAL LOW (ref 13.0–18.0)

## 2013-04-06 LAB — HEMOGLOBIN: HGB: 10 g/dL — ABNORMAL LOW (ref 13.0–18.0)

## 2013-04-07 LAB — CBC WITH DIFFERENTIAL/PLATELET
Basophil %: 0.3 %
HGB: 10.7 g/dL — ABNORMAL LOW (ref 13.0–18.0)
Lymphocyte #: 0.9 10*3/uL — ABNORMAL LOW (ref 1.0–3.6)
MCH: 32.7 pg (ref 26.0–34.0)
MCHC: 35.6 g/dL (ref 32.0–36.0)
Monocyte %: 8.6 %
Neutrophil #: 5.1 10*3/uL (ref 1.4–6.5)
Neutrophil %: 70.8 %
Platelet: 118 10*3/uL — ABNORMAL LOW (ref 150–440)
WBC: 7.2 10*3/uL (ref 3.8–10.6)

## 2013-04-07 LAB — BASIC METABOLIC PANEL
BUN: 13 mg/dL (ref 7–18)
Calcium, Total: 8.5 mg/dL (ref 8.5–10.1)
Creatinine: 1.07 mg/dL (ref 0.60–1.30)
Glucose: 118 mg/dL — ABNORMAL HIGH (ref 65–99)
Potassium: 4.2 mmol/L (ref 3.5–5.1)

## 2013-04-10 DIAGNOSIS — I2510 Atherosclerotic heart disease of native coronary artery without angina pectoris: Secondary | ICD-10-CM

## 2013-04-10 DIAGNOSIS — S82209A Unspecified fracture of shaft of unspecified tibia, initial encounter for closed fracture: Secondary | ICD-10-CM

## 2013-04-10 DIAGNOSIS — K7290 Hepatic failure, unspecified without coma: Secondary | ICD-10-CM

## 2013-04-10 DIAGNOSIS — F0390 Unspecified dementia without behavioral disturbance: Secondary | ICD-10-CM

## 2013-04-10 DIAGNOSIS — F22 Delusional disorders: Secondary | ICD-10-CM

## 2013-04-15 ENCOUNTER — Ambulatory Visit: Payer: Medicare Other | Admitting: Cardiovascular Disease

## 2013-04-21 DIAGNOSIS — N3090 Cystitis, unspecified without hematuria: Secondary | ICD-10-CM

## 2013-04-21 DIAGNOSIS — F0390 Unspecified dementia without behavioral disturbance: Secondary | ICD-10-CM

## 2013-04-21 DIAGNOSIS — I2510 Atherosclerotic heart disease of native coronary artery without angina pectoris: Secondary | ICD-10-CM

## 2013-04-21 DIAGNOSIS — F0392 Unspecified dementia, unspecified severity, with psychotic disturbance: Secondary | ICD-10-CM

## 2013-04-21 DIAGNOSIS — F22 Delusional disorders: Secondary | ICD-10-CM

## 2013-04-21 DIAGNOSIS — S82209A Unspecified fracture of shaft of unspecified tibia, initial encounter for closed fracture: Secondary | ICD-10-CM

## 2013-04-25 ENCOUNTER — Observation Stay: Payer: Self-pay | Admitting: Internal Medicine

## 2013-04-25 DIAGNOSIS — I2510 Atherosclerotic heart disease of native coronary artery without angina pectoris: Secondary | ICD-10-CM

## 2013-04-25 DIAGNOSIS — E785 Hyperlipidemia, unspecified: Secondary | ICD-10-CM

## 2013-04-25 DIAGNOSIS — I10 Essential (primary) hypertension: Secondary | ICD-10-CM

## 2013-04-25 LAB — TROPONIN I
Troponin-I: <0.02 ng/mL
Troponin-I: <0.02 ng/mL
Troponin-I: <0.02 ng/mL

## 2013-04-25 LAB — BASIC METABOLIC PANEL
Chloride: 103 mmol/L (ref 98–107)
Co2: 31 mmol/L (ref 21–32)
EGFR (African American): >60
Potassium: 4.1 mmol/L (ref 3.5–5.1)
Sodium: 136 mmol/L (ref 136–145)

## 2013-04-25 LAB — CBC
HGB: 12.8 g/dL — ABNORMAL LOW (ref 13.0–18.0)
MCH: 32.3 pg (ref 26.0–34.0)
MCHC: 34.6 g/dL (ref 32.0–36.0)
MCV: 93 fL (ref 80–100)
Platelet: 119 10*3/uL — ABNORMAL LOW (ref 150–440)
RBC: 3.97 10*6/uL — ABNORMAL LOW (ref 4.40–5.90)
WBC: 5.2 10*3/uL (ref 3.8–10.6)

## 2013-04-25 LAB — PROTIME-INR
INR: 1
Prothrombin Time: 13.3 secs (ref 11.5–14.7)

## 2013-04-25 LAB — APTT: Activated PTT: 33.5 secs (ref 23.6–35.9)

## 2013-04-25 LAB — CK TOTAL AND CKMB (NOT AT ARMC)
CK, Total: 36 U/L (ref 35–232)
CK, Total: 35 U/L (ref 35–232)
CK-MB: 1 ng/mL (ref 0.5–3.6)
CK-MB: 1 ng/mL (ref 0.5–3.6)

## 2013-04-26 LAB — BASIC METABOLIC PANEL
Anion Gap: 4 — ABNORMAL LOW (ref 7–16)
Calcium, Total: 8.9 mg/dL (ref 8.5–10.1)
Chloride: 105 mmol/L (ref 98–107)
Co2: 29 mmol/L (ref 21–32)
EGFR (African American): >60
EGFR (Non-African Amer.): >60
Osmolality: 277 (ref 275–301)
Potassium: 3.7 mmol/L (ref 3.5–5.1)

## 2013-04-26 LAB — CBC WITH DIFFERENTIAL/PLATELET
Basophil #: 0 10*3/uL (ref 0.0–0.1)
Eosinophil #: 0.5 10*3/uL (ref 0.0–0.7)
Eosinophil %: 8 %
HGB: 12.6 g/dL — ABNORMAL LOW (ref 13.0–18.0)
Lymphocyte #: 1.3 10*3/uL (ref 1.0–3.6)
Lymphocyte %: 23 %
MCH: 32.6 pg (ref 26.0–34.0)
MCHC: 35 g/dL (ref 32.0–36.0)
MCV: 93 fL (ref 80–100)
Monocyte #: 0.4 x10 3/mm (ref 0.2–1.0)
Monocyte %: 7.1 %
Neutrophil #: 3.5 10*3/uL (ref 1.4–6.5)
Neutrophil %: 61.3 %
RBC: 3.88 10*6/uL — ABNORMAL LOW (ref 4.40–5.90)
RDW: 16.4 % — ABNORMAL HIGH (ref 11.5–14.5)

## 2013-04-26 LAB — TSH: Thyroid Stimulating Horm: 5.68 u[IU]/mL — ABNORMAL HIGH

## 2013-04-26 LAB — LIPID PANEL
Cholesterol: 153 mg/dL
HDL Cholesterol: 36 mg/dL — ABNORMAL LOW
Ldl Cholesterol, Calc: 96 mg/dL
Triglycerides: 104 mg/dL
VLDL Cholesterol, Calc: 21 mg/dL

## 2013-04-27 ENCOUNTER — Telehealth: Payer: Self-pay | Admitting: Family Medicine

## 2013-04-27 DIAGNOSIS — I10 Essential (primary) hypertension: Secondary | ICD-10-CM

## 2013-04-27 DIAGNOSIS — R079 Chest pain, unspecified: Secondary | ICD-10-CM

## 2013-04-27 DIAGNOSIS — I2510 Atherosclerotic heart disease of native coronary artery without angina pectoris: Secondary | ICD-10-CM

## 2013-04-27 DIAGNOSIS — E785 Hyperlipidemia, unspecified: Secondary | ICD-10-CM

## 2013-04-27 LAB — TSH: Thyroid Stimulating Horm: 8.04 u[IU]/mL — ABNORMAL HIGH

## 2013-04-27 LAB — CBC WITH DIFFERENTIAL/PLATELET
Basophil #: 0.1 10*3/uL (ref 0.0–0.1)
Eosinophil %: 10.4 %
HCT: 42.5 % (ref 40.0–52.0)
HGB: 14.6 g/dL (ref 13.0–18.0)
Lymphocyte #: 1 10*3/uL (ref 1.0–3.6)
MCV: 94 fL (ref 80–100)
WBC: 6.5 10*3/uL (ref 3.8–10.6)

## 2013-04-27 LAB — T4, FREE: Free Thyroxine: 0.79 ng/dL (ref 0.76–1.46)
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
## 2013-04-27 NOTE — Telephone Encounter (Signed)
Was admitted Will await more info from [facility]

## 2013-04-29 DIAGNOSIS — S82209A Unspecified fracture of shaft of unspecified tibia, initial encounter for closed fracture: Secondary | ICD-10-CM

## 2013-04-29 DIAGNOSIS — IMO0002 Reserved for concepts with insufficient information to code with codable children: Secondary | ICD-10-CM

## 2013-05-04 ENCOUNTER — Telehealth: Payer: Self-pay

## 2013-05-04 NOTE — Telephone Encounter (Signed)
Attempted to contact pt regarding discharge from [facility] on 04/27/13.  Patient has follow up with provider Dr. Mariah Milling  on 05/14/13 at 11:15 at [facility].  Left message for pt to call back.

## 2013-05-05 NOTE — Telephone Encounter (Signed)
Patient contacted regarding discharge from [facility] on 04/27/13.  Patient understands to follow up with Dr. Mariah Milling on 05/14/13 at 11:15 at [facility]. Patient understands discharge instructions? yes Patient understands medications and regiment? yes Patient understands to bring all medications to this visit? yes

## 2013-05-14 ENCOUNTER — Ambulatory Visit (INDEPENDENT_AMBULATORY_CARE_PROVIDER_SITE_OTHER): Payer: Medicare Other | Admitting: Cardiovascular Disease

## 2013-05-14 ENCOUNTER — Encounter: Payer: Self-pay | Admitting: Cardiovascular Disease

## 2013-05-14 VITALS — BP 130/80 | HR 48 | Ht 69.0 in | Wt 155.0 lb

## 2013-05-14 DIAGNOSIS — I25810 Atherosclerosis of coronary artery bypass graft(s) without angina pectoris: Secondary | ICD-10-CM

## 2013-05-14 DIAGNOSIS — R0602 Shortness of breath: Secondary | ICD-10-CM

## 2013-05-14 DIAGNOSIS — I10 Essential (primary) hypertension: Secondary | ICD-10-CM

## 2013-05-14 DIAGNOSIS — R079 Chest pain, unspecified: Secondary | ICD-10-CM

## 2013-05-14 DIAGNOSIS — E785 Hyperlipidemia, unspecified: Secondary | ICD-10-CM

## 2013-05-14 NOTE — Assessment & Plan Note (Signed)
No recent lipids available. Suggested he continue his statin

## 2013-05-14 NOTE — Patient Instructions (Signed)
Heart rate is low, HR 48 today  Please hold the nadolol. Please  Have [facility] record the blood pressure and heart rate in a personal log book   Please call us if you have new issues that need to be addressed before your next appt.  Your physician wants you to follow-up in: 6 months.  You will receive a reminder letter in the mail two months in advance. If you don't receive a letter, please call our office to schedule the follow-up appointment.
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
## 2013-05-14 NOTE — Assessment & Plan Note (Signed)
Blood pressure well controlled. Heart rate is very low and he has symptoms of fatigue. We'll hold his nadolol.  Suggested he closely monitor her blood pressure and heart rate. If blood pressure climbs, and his heart rate tolerates, could potentially restart lower dose nadolol

## 2013-05-14 NOTE — Assessment & Plan Note (Signed)
Currently with no symptoms of angina. No further workup at this time. Continue current medication regimen. 

## 2013-05-29 DIAGNOSIS — H65199 Other acute nonsuppurative otitis media, unspecified ear: Secondary | ICD-10-CM

## 2013-05-29 DIAGNOSIS — N052 Unspecified nephritic syndrome with diffuse membranous glomerulonephritis: Secondary | ICD-10-CM

## 2013-05-29 DIAGNOSIS — S82899B Other fracture of unspecified lower leg, initial encounter for open fracture type I or II: Secondary | ICD-10-CM

## 2013-05-29 DIAGNOSIS — F329 Major depressive disorder, single episode, unspecified: Secondary | ICD-10-CM

## 2013-05-29 DIAGNOSIS — I2510 Atherosclerotic heart disease of native coronary artery without angina pectoris: Secondary | ICD-10-CM

## 2013-06-05 ENCOUNTER — Encounter: Payer: Self-pay | Admitting: *Deleted

## 2013-06-12 ENCOUNTER — Telehealth: Payer: Self-pay

## 2013-06-12 NOTE — Telephone Encounter (Signed)
Spoke w/ pt regarding BP log that he had dropped off for 12/7-12/15 since holding nadolol. Per Dr. Mariah MillingGollan, " Good BP & HR #s.  Stay on same meds.'" Pt is aware of results and agreeable to plan.

## 2013-06-17 ENCOUNTER — Ambulatory Visit: Payer: Medicare Other | Admitting: Internal Medicine

## 2013-08-17 ENCOUNTER — Telehealth: Payer: Self-pay | Admitting: *Deleted

## 2013-08-17 NOTE — Telephone Encounter (Signed)
Unable to leave a message no voicemail. Time to schedule a carotid doppler (2 yr f/u).

## 2013-08-20 ENCOUNTER — Other Ambulatory Visit (HOSPITAL_COMMUNITY): Payer: Self-pay | Admitting: *Deleted

## 2013-08-20 DIAGNOSIS — I6529 Occlusion and stenosis of unspecified carotid artery: Secondary | ICD-10-CM

## 2013-09-03 ENCOUNTER — Encounter (INDEPENDENT_AMBULATORY_CARE_PROVIDER_SITE_OTHER): Payer: Medicare Other

## 2013-09-03 DIAGNOSIS — I6529 Occlusion and stenosis of unspecified carotid artery: Secondary | ICD-10-CM

## 2013-11-09 DIAGNOSIS — B182 Chronic viral hepatitis C: Secondary | ICD-10-CM | POA: Insufficient documentation

## 2013-11-11 ENCOUNTER — Encounter: Payer: Self-pay | Admitting: Cardiovascular Disease

## 2013-11-11 ENCOUNTER — Ambulatory Visit (INDEPENDENT_AMBULATORY_CARE_PROVIDER_SITE_OTHER): Payer: Medicare Other | Admitting: Cardiovascular Disease

## 2013-11-11 VITALS — BP 110/72 | HR 56 | Ht 66.0 in | Wt 170.5 lb

## 2013-11-11 DIAGNOSIS — E785 Hyperlipidemia, unspecified: Secondary | ICD-10-CM

## 2013-11-11 DIAGNOSIS — I10 Essential (primary) hypertension: Secondary | ICD-10-CM

## 2013-11-11 DIAGNOSIS — I25810 Atherosclerosis of coronary artery bypass graft(s) without angina pectoris: Secondary | ICD-10-CM

## 2013-11-11 NOTE — Assessment & Plan Note (Signed)
Currently with no symptoms of angina. No further workup at this time. Continue current medication regimen. We have suggested if his left shoulder and neck pain gets worse, to contact our office. Stress test to be performed particularly if symptoms are with exertion

## 2013-11-11 NOTE — Assessment & Plan Note (Signed)
We will try to obtain his most recent lipid panel from Dr. Hyacinth Meeker. Goal LDL less than 70
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
## 2013-11-11 NOTE — Patient Instructions (Signed)
You are doing well. No medication changes were made.  Please call us if you have new issues that need to be addressed before your next appt.  Your physician wants you to follow-up in: 6 months.  You will receive a reminder letter in the mail two months in advance. If you don't receive a letter, please call our office to schedule the follow-up appointment.   

## 2013-11-11 NOTE — Assessment & Plan Note (Signed)
Blood pressure is well controlled on today's visit. No changes made to the medications. 

## 2013-11-12 ENCOUNTER — Encounter: Payer: Self-pay | Admitting: Cardiovascular Disease

## 2013-11-22 ENCOUNTER — Other Ambulatory Visit: Payer: Self-pay | Admitting: Cardiovascular Disease

## 2013-11-22 MED ORDER — SIMVASTATIN 20 MG PO TABS: 20.0000 mg | ORAL_TABLET | Freq: Every day | ORAL | Status: DC

## 2013-12-09 NOTE — Telephone Encounter (Signed)
This encounter was created in error - please disregard.

## 2014-01-25 ENCOUNTER — Ambulatory Visit: Payer: Self-pay | Admitting: Gastroenterology

## 2014-01-25 LAB — CBC WITH DIFFERENTIAL/PLATELET
Basophil #: 0 10*3/uL (ref 0.0–0.1)
Basophil %: 0.4 %
EOS ABS: 0.6 10*3/uL (ref 0.0–0.7)
EOS PCT: 10.5 %
HCT: 44.1 % (ref 40.0–52.0)
HGB: 15 g/dL (ref 13.0–18.0)
LYMPHS PCT: 17.8 %
Lymphocyte #: 1 10*3/uL (ref 1.0–3.6)
MCH: 32 pg (ref 26.0–34.0)
MCHC: 33.9 g/dL (ref 32.0–36.0)
MCV: 94 fL (ref 80–100)
MONOS PCT: 5.9 %
Monocyte #: 0.3 x10 3/mm (ref 0.2–1.0)
NEUTROS ABS: 3.6 10*3/uL (ref 1.4–6.5)
NEUTROS PCT: 65.4 %
Platelet: 93 10*3/uL — ABNORMAL LOW (ref 150–440)
RBC: 4.68 10*6/uL (ref 4.40–5.90)
RDW: 15.2 % — ABNORMAL HIGH (ref 11.5–14.5)
WBC: 5.5 10*3/uL (ref 3.8–10.6)

## 2014-01-25 LAB — PROTIME-INR
INR: 1.1
PROTHROMBIN TIME: 13.6 s (ref 11.5–14.7)

## 2014-01-27 LAB — PATHOLOGY REPORT

## 2014-04-20 ENCOUNTER — Emergency Department: Payer: Self-pay | Admitting: Emergency Medicine

## 2014-05-10 ENCOUNTER — Ambulatory Visit (INDEPENDENT_AMBULATORY_CARE_PROVIDER_SITE_OTHER): Payer: Medicare Other | Admitting: Cardiovascular Disease

## 2014-05-10 ENCOUNTER — Encounter: Payer: Self-pay | Admitting: Cardiovascular Disease

## 2014-05-10 VITALS — BP 148/82 | HR 59 | Ht 68.0 in | Wt 162.8 lb

## 2014-05-10 DIAGNOSIS — Z9181 History of falling: Secondary | ICD-10-CM

## 2014-05-10 DIAGNOSIS — I6523 Occlusion and stenosis of bilateral carotid arteries: Secondary | ICD-10-CM

## 2014-05-10 DIAGNOSIS — I25709 Atherosclerosis of coronary artery bypass graft(s), unspecified, with unspecified angina pectoris: Secondary | ICD-10-CM

## 2014-05-10 DIAGNOSIS — E785 Hyperlipidemia, unspecified: Secondary | ICD-10-CM

## 2014-05-10 DIAGNOSIS — S022XXA Fracture of nasal bones, initial encounter for closed fracture: Secondary | ICD-10-CM | POA: Insufficient documentation

## 2014-05-10 DIAGNOSIS — S022XXD Fracture of nasal bones, subsequent encounter for fracture with routine healing: Secondary | ICD-10-CM

## 2014-05-10 DIAGNOSIS — I25810 Atherosclerosis of coronary artery bypass graft(s) without angina pectoris: Secondary | ICD-10-CM

## 2014-05-10 DIAGNOSIS — I10 Essential (primary) hypertension: Secondary | ICD-10-CM

## 2014-05-10 DIAGNOSIS — R42 Dizziness and giddiness: Secondary | ICD-10-CM

## 2014-05-10 NOTE — Assessment & Plan Note (Signed)
Less than 39% stenosis bilaterally. Recheck in 2 years time

## 2014-05-10 NOTE — Assessment & Plan Note (Signed)
The events of his fall and broken nose were discussed with him. He is currently off his aspirin. As it has been 3 weeks, no nosebleeds, could probably restart low-dose aspirin.

## 2014-05-10 NOTE — Assessment & Plan Note (Signed)
Currently with no symptoms of angina. No further workup at this time. Continue current medication regimen. 

## 2014-05-10 NOTE — Patient Instructions (Signed)
You are doing well. No medication changes were made.  Please call us if you have new issues that need to be addressed before your next appt.  Your physician wants you to follow-up in: 6 months.  You will receive a reminder letter in the mail two months in advance. If you don't receive a letter, please call our office to schedule the follow-up appointment.   

## 2014-05-10 NOTE — Assessment & Plan Note (Signed)
We will try to obtain the most recent lipid panel from Dr. Hyacinth MeekerMiller, goal LDL less than 70
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
## 2014-05-10 NOTE — Assessment & Plan Note (Signed)
Rare episodes of orthostasis. In general this has not been a major issue

## 2014-05-10 NOTE — Assessment & Plan Note (Signed)
Long discussion today concerning the benefits of daily exercise. We have discussed a regular exercise program with him and his wife. Recent fall likely from leg/gait instability

## 2014-05-10 NOTE — Assessment & Plan Note (Signed)
Blood pressure mildly elevated on today's visit. We've recommended he closely monitor his blood pressure at home and call our office if this continues to run high

## 2014-07-12 DIAGNOSIS — I639 Cerebral infarction, unspecified: Secondary | ICD-10-CM

## 2014-07-12 HISTORY — DX: Cerebral infarction, unspecified: I63.9

## 2014-08-02 ENCOUNTER — Inpatient Hospital Stay: Payer: Self-pay | Admitting: Internal Medicine

## 2014-08-02 DIAGNOSIS — I340 Nonrheumatic mitral (valve) insufficiency: Secondary | ICD-10-CM

## 2014-08-05 ENCOUNTER — Encounter: Payer: Self-pay | Admitting: Cardiovascular Disease

## 2014-08-05 ENCOUNTER — Encounter: Payer: Self-pay | Admitting: Internal Medicine

## 2014-08-10 ENCOUNTER — Encounter
Admit: 2014-08-10 | Disposition: A | Discharge status: Home / Self Care | Payer: Self-pay | Attending: Internal Medicine | Admitting: Internal Medicine

## 2014-08-25 ENCOUNTER — Emergency Department: Payer: Self-pay | Admitting: Emergency Medicine

## 2014-09-07 ENCOUNTER — Telehealth: Payer: Self-pay

## 2014-09-07 DIAGNOSIS — I498 Other specified cardiac arrhythmias: Secondary | ICD-10-CM

## 2014-09-07 NOTE — Telephone Encounter (Signed)
This encounter was created in error - please disregard.

## 2014-09-07 NOTE — Telephone Encounter (Signed)
Spoke w/ pt's wife.  She states that she had a MyChart conversation w/ Dr. Mariah MillingGollan and would like to proceed w/ 30 day monitor.  Advised her that I will submit info to [employer] and have monitor sent to pt's home.

## 2014-09-10 ENCOUNTER — Encounter
Admit: 2014-09-10 | Disposition: A | Discharge status: Home / Self Care | Payer: Self-pay | Attending: Internal Medicine | Admitting: Internal Medicine

## 2014-09-10 DIAGNOSIS — I499 Cardiac arrhythmia, unspecified: Secondary | ICD-10-CM | POA: Diagnosis not present

## 2014-10-01 NOTE — H&P (Signed)
PATIENT NAME:  Alex [name], Alex [name] MR#:  [id number] DATE OF BIRTH:  1932-09-12  DATE OF ADMISSION:  08/17/2012  PRIMARY CARE PHYSICIAN: [name].   REFERRING PHYSICIAN: Dorothea GlassmanPaul Malinda, M.D.   CHIEF COMPLAINT: Chest discomfort.   HISTORY OF PRESENT ILLNESS: The patient is a 81 year old Caucasian male with history of hypertension, depression and anxiety. He is also forgetful and a poor historian. The patient had to take a pause and think about his symptoms and then he will contemplate and then he would repeat the story in a different way. Anyway, the basic story is that for the last 2 days he feels kind of uncomfortable feeling in his chest and some mild left shoulder pain. This is intermittent and in the last 24 hours, he had some little dizziness feeling and uneasy feeling in his stomach,  kind of nausea. He could not have lunch with his wife. He decided to come to the Emergency Department for evaluation. Right now, he is chest pain-free. He has no specific symptoms right now. The Emergency Department physician was concerned about his EKG findings of left T waves, which looks slightly deeper than before. These are minor findings. Hospitalist was consulted to admit the patient for observation and follow up on cardiac enzymes.   REVIEW OF SYSTEMS:   CONSTITUTIONAL: Denies having any fever. No chills. No fatigue.  EYES: No blurring of vision. No double vision.  ENT: No hearing impairment. No sore throat. No dysphagia.  CARDIOVASCULAR: No chest pain, but feeling uneasy feeling in his chest. No shortness of breath. No edema. No syncope, but has mild dizziness.  RESPIRATORY: No shortness of breath. No hemoptysis. No cough.  GASTROINTESTINAL: No abdominal pain, but had some nausea. No vomiting and no diarrhea. No melena. No hematochezia.  GENITOURINARY: No dysuria. No frequency of urination.  MUSCULOSKELETAL: No joint pain or swelling. No muscular pain or swelling.  INTEGUMENTARY: No skin rash. No  ulcers.  NEUROLOGY: No focal weakness. No seizure activity. No headache.  PSYCHIATRY: He has some anxiety and history of depression.  ENDOCRINE: No polyuria or polydipsia. No heat or cold intolerance.   PAST MEDICAL HISTORY: The patient was admitted exactly a year ago on 03/18 with atypical chest pain that was noncardiac in origin. He had normal stress Myoview test. Systemic hypertension, gastroesophageal reflux disease, history of hepatitis C, degenerative joint disease, idiopathic thrombocytopenic purpura, coronary artery disease, status post coronary artery bypass graft, kidney stones, hyperlipidemia, transient ischemic attack, depression and mild dementia.   PAST SURGICAL HISTORY: Coronary artery bypass graft in 1996, right femur surgical repair in 1981, right knee arthroscopy, bilateral cataract surgery, colonoscopy was in 2010 showing internal hemorrhoids and diverticulosis, EGD was in 2010 showing hiatal hernia, erosive gastritis and duodenitis, last cardiac catheter was in 2010 showing 50% SVG to distal RCA and again he had stress Myoview test 08/2011 and it was normal.   FAMILY HISTORY: His mother suffered from Alzheimer dementia. His father died from a motor vehicle accident.   SOCIAL HABITS: Nonsmoker. No history of alcohol or drug abuse.   SOCIAL HISTORY: He is married, living with his wife here. 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   ADMISSION MEDICATIONS: Alprazolam 0.5 mg twice a day p.r.n. for anxiety, aspirin 81 mg 2 tablets a day in the morning, Galantamine 4 mg twice a day, Namenda 10 mg twice a day, sertraline 50 mg once a day, Irbesartan 150 mg once a day, multivitamin once a day, nitroglycerin  sublingual 0.4 mg p.r.n., pantoprazole 40 mg once a day, simvastatin 10 mg in the evening, Xifaxim 550 mg twice a day, Olysio 150 mg once a day in the evening along with  Solvaldi 400 mg in the evening.   ALLERGIES: AGGRENOX, CIPRO, CONTRAST DYE, EFFEXOR AND  MORPHINE. Types of reactions are not very clear.   PHYSICAL EXAMINATION:  VITAL SIGNS: His blood pressure 152/90, respiratory rate 16, pulse 68, temperature 98.1, oxygen saturation 97%.  GENERAL APPEARANCE: Elderly male sitting on the stretcher in no acute distress, healthy looking, he looks comfortable.  HEAD AND NECK: No pallor. No icterus. No cyanosis. Ears: Normal hearing. No discharge. No lesions. No ulcers. Nasal mucosa was normal without ulcers, no discharge, no bleeding. Oropharyngeal area was normal without ulcers, no oral thrush. Eyes: Revealed normal eyelids and conjunctivae. Pupils about 5 mm, round, equal and reactive to light.  NECK: Supple. Trachea at midline. No thyromegaly. No cervical lymphadenopathy. No masses.  HEART: Normal S1, S2. No S3, S4. No murmur. No gallop. No carotid bruits.  RESPIRATORY: Normal breathing pattern without use of accessory muscles. No rales. No wheezing.  ABDOMEN: Soft without tenderness. No hepatosplenomegaly. No masses. No hernias.  SKIN: No ulcers. No subcutaneous nodules.  MUSCULOSKELETAL: No joint swelling. No clubbing.  NEUROLOGIC: Cranial nerves II through XII are intact. No focal motor deficit.  PSYCHIATRIC: The patient is alert, oriented to place and people. The patient is forgetful. He has to take his time to think before he answers questions. Mood and affect were normal.   LABORATORY FINDINGS AND RADIOLOGIC DATA: His EKG showed normal sinus rhythm at rate of 80 per minute. Left axis deviation. Right bundle branch block. T wave inversion in the anterolateral leads. Chest x-ray showed no acute cardiopulmonary abnormalities. Serum glucose 80, BUN 19, creatinine 0.9, sodium 134, potassium 3.8. Normal liver function tests and transaminases except for slight elevation of alkaline phosphatase at 148. Troponin 0.04. CBC showed white count of 5000, hemoglobin 16, hematocrit 44, platelet count 70.   ASSESSMENT:  1.  Atypical chest discomfort associated  with mild dizziness and a little nausea.  2.  Shrot runs of SVTs at 150 /min,  noted over monitor 3.  History of coronary artery disease, status post coronary artery bypass graft.  4.  Systemic hypertension.  5.  Idiopathic thrombocytopenia purpura. 6.  Gastroesophageal reflux disease.  7.  Hyperlipidemia.  8.  Mild dementia. 9.  Anxiety and depression.  10.  Hepatitis C.   PLAN: We will admit the patient for observation. I will follow up on his cardiac enzymes. He does not have a strong story for angina. However given the tachyarrhythmias I will consult cardiology - I could not give betablocker due to bradycardia as heart rate went to 54 / min post SVT.  I will continue his home medications as listed above. I will hold two of his medications, which is non-formulary. These are Olysio and the Solvaldi. These can be resumed upon discharge from the hospital.   CODE STATUS: Full code.   Time spent in evaluating this patient and reviewing medical records took more than 55 minutes.    ____________________________ Carney Corners. Rudene Re, MD amd:aw D: 08/18/2012 01:02:35 ET T: 08/18/2012 09:10:15 ET JOB#: [id number]  cc: Carney Corners. Rudene Re, MD, <Dictator> Karolee Ohs Dala Dock MD ELECTRONICALLY SIGNED 08/19/2012 3:25

## 2014-10-01 NOTE — Consult Note (Signed)
Admit Diagnosis:   FRACTURE OF SHAFT OF TIBIA: Onset Date: 05-Apr-2013, Status: Active, Description: FRACTURE OF SHAFT OF TIBIA   Cipro: Unknown  Morphine: Unknown  Contrast dye: Unknown  Effexor: Unknown  Aggrenox: Unknown   Impression 81 yo M patient of Dr. Bethann PunchesMark Alex [name] admitted yesterday s/p rt tib-fib fx. IM consulted for medical management. Pt now in OR. Not seen today. Plan to have Dr. Hyacinth MeekerMiller pick him up tomorrow. Please page if questions or concerns.   Electronic Signatures: Raj JanusSolum, Edmar Blankenburg M (MD)  (Signed 26-Oct-14 08:50)  Authored: Health Issues, Allergies, Impression/Plan   Last Updated: 26-Oct-14 08:50 by Raj JanusSolum, Kathrene Sinopoli M (MD)

## 2014-10-01 NOTE — Consult Note (Signed)
Patient is a 81 yo who we are asked to see regarding CP  The patient has a history of CAD  He is s/p CABG in 1996  Last cath  was in 2010   It showed sever native CAD  and patent grafts with 50% stenosis in graft to RCA  Normal LV function.  He has been followed by [facility] in Cardiology clinic Last there November 2013.patient broke his R leg at the end of October  He was slow to progress in rehab and was sent to a SNFhe complained of some L sided CP and neck pain  Brought to [facility] admit he has had intermittent chest tightness talking to the patient (he is a somewhat difficult historian) he notes over the past year episodes of chest pain and tightness.  Not always associated with activity.have had some diaphoresis once.  He has usually let episodes go away on own, not told anyone.  Took NTG once, thinks it may have helped.  Otherwise he admits to not taking NTGHe has noticed a decrease in his energy level over the past year. describing pain prior to CABG he says it was like this discomfort but significantly worse. he is without discomfort  No SOB  Denies PND    He also describes a L neck pain.  Comes and goes.  Does have some radiation to L temple  Not associated with CP MEDICAL HISTORY: CAD  s/p CABG 1996  Osteoarthritis. Gastroesophageal reflux disease. Depression.  Hypertension. Anxiety.  Seen by psych in past. Hepatitis C with varices  Thrombocytopenia.  Left lower extremity tibial fibular fracture status post surgery.   ALLERGIES:  1.  CIPRO.  MORPHINE.  CONTRAST DYE.  EFFEXOR. AGGRENOX.  MEDICATIONS:   Simvastatin 10 mg once a day.  Sertraline 50 mg once daily. Protonix 40 mg once a day. Nitrostat 0.4 mg sublingual every 5 minutes. Namenda XR 21 mg once a day.  Multivitamin 1 tablet once a day.  Irbesartan 150 mg daily once a day. Galantamine 200 mg 2 times a day.  Ferrous sulfate 325 mg 3 times a day. Calcium 600, with vitamin D oral 2 times a day.  Aspirin 162 mg once a day. Alprazolam 0.5 mg 3  times a day.  HISTORY: The patient states smoked remotely. Denies drinking alcohol or using illicit drugs. Married; usually lives with his wife. Now in a nursing home. Retired from a working as a Freight forwarder of a Careers adviser.  HISTORY: Mother suffered from Alzheimer dementia. Father died from motor vehicle accident.  OF SYSTEMS:Has been experiencing generalized weakness. all systems reivewed  Neg to above problem except as noted above EXAMINATION:This is well-built, well-nourished, age-appropriate male, lying down in the bed, not in distress. SIGNS: Temperature 98.1, pulse 60, blood pressure 129/73, respiratory rate of 18, oxygen saturation 96% on room air. Head normocephalic, atraumatic. There is no sclerae icterus. Conjunctivae normal. Supple. No lymphadenopathy. No JVD. No carotid bruit. L neck is tender  Nontender  Bilaterally clear to auscultation. S1, S2 regular. No murmurs are heard. No S3 or S4Bowel sounds present. Soft, nontender, nondistended. Could not appreciate any hepatosplenomegaly. Right lower extremity is in a boot. Left leg: No pedal edema. Pulses 2+. No rash or lesions. Moving 3/4 extremities  Patient is alert and oriented x 3.    SR 62.  RBBB  LVH with repolarization abnormality  Unchanged from prior EKGs    Routine Chem:  14-Nov-14 23:21   Glucose, Serum  103  BUN 17  Creatinine (comp) 0.97  Sodium, Serum 136  Potassium, Serum 4.1  Chloride, Serum 103  CO2, Serum 31  Calcium (Total), Serum 9.1  Anion Gap  2  Osmolality (calc) 274  eGFR (African American) >60  eGFR (Non-African American) >60 (eGFR values <46m/min/1.73 m2 may be an indication of chronicdisease (CKD).eGFR is useful in patients with stable renal function.eGFR calculation will not be reliable in acutely ill patientsserum creatinine is changing rapidly. It is not useful in on dialysis. The eGFR calculation may not be applicablepatients at the low and high extremes of body sizes, pregnantand vegetarians.)  Cardiac:   14-Nov-14 23:21   CK, Total 47  CPK-MB, Serum 1.0 (Result(s) reported on 25 Apr 2013 at 12:06AM.)  Troponin I < 0.02 (0.00-0.05ng/mL or less: NEGATIVE Repeat testing in 3-6 hrs if clinically indicated.ng/mL: POTENTIAL MYOCARDIAL INJURY. Repeat testing in 3-6 hrs if clinically indicated.An increase or decrease of 30% or more on serial testing suggests a clinically important change)  15-Nov-14 06:45   CK, Total 36  CPK-MB, Serum 1.0 (Result(s) reported on 25 Apr 2013 at 07:13AM.)  Troponin I < 0.02 (0.00-0.05ng/mL or less: NEGATIVE Repeat testing in 3-6 hrs if clinically indicated.ng/mL: POTENTIAL MYOCARDIAL INJURY. Repeat testing in 3-6 hrs if clinically indicated.An increase or decrease of 30% or more on serial testing suggests a clinically important change)  Routine Coag:  14-Nov-14 23:21   Prothrombin 13.3  INR 1.0 (INR reference interval applies to patients on anticoagulant therapy.single INR therapeutic range for coumarins is not optimal for allhowever, the suggested range for most indications is- 3.0.to the INR Reference Range may include: Prosthetic heartacute myocardial infarction, prevention of myocardialand combinations of aspirin and anticoagulant. The needa higher or lower target INR must be assessed individually.The Pharmacology and Management of the Vitamin K the seventh ACCP Conference on Antithrombotic andTherapy. CJHERD.4081Sept:126 (3suppl): [id number] value >55% may artifactually increase the PT.  In one study, increase was an average of 25%. "Effect on Routine and Special Coagulation Testing ValuesCitrate Anticoagulant Adjustment in Patients with High HCT Values."Journal of Clinical Pathology 24481;856:314-970)  Activated PTT (APTT) 33.5 (A HCT value >55% may artifactually increase the APTT. In one study,increase was an average of 19%."Effect on Routine and Special Coagulation Testing ValuesCitrate Anticoagulant Adjustment in Patients with High HCT Values."Journal  of Clinical Pathology 22637;858:850-277)  D-Dimer, Quantitative  2.05 ("If the D-dimer test is being used to assist in the exclusion of DVTPE, note the following:  In various studies concerning themethodology (STA Liatest) in use by this laboratory, it hasreported that with a cut-off value of 0.50 ug/mL FEU, the predictive value regarding the exclusion of thrombosis isthe 95-100% range."  In patients with high pre-test probabilityDVT/PE the results of the D-dimer test should be correlated withdiagnostic and clinical assessment modalities.[place], 2005.)  Routine Hem:  14-Nov-14 23:21   WBC (CBC) 5.2  RBC (CBC)  3.97  Hemoglobin (CBC)  12.8  Hematocrit (CBC)  37.1  Platelet Count (CBC)  119 (Result(s) reported on 25 Apr 2013 at 12:01AM.)  MCV 93  MCH 32.3  MCHC 34.6  RDW  17.0   Radiology Results: XRay:    14-Nov-14 23:46, Chest PA and Lateral  Chest PA and Lateral   REASON FOR EXAM:    Chest Pain  COMMENTS:       PROCEDURE: DXR - DXR CHEST PA (OR AP) AND LATERAL  - Apr 24 2013 11:46PM     CLINICAL DATA:  Chest pain    EXAM:  CHEST  2 VIEW  COMPARISON:  08/17/2012    FINDINGS:  Sternotomy wires overlie normal cardiac silhouette. No effusion,  infiltrate, or pneumothorax.   IMPRESSION:  No acute cardiopulmonary process.      Electronically Signed    By: Suzy Bouchard M.D.    On: 04/25/2013 00:27         Verified By: Rennis Golden, M.D.,  [place]:    15-Nov-14 05:35, [place] Color Flow Doppler Low Extrem Bilat (Legs)  [place] Color Flow Doppler Low Extrem Bilat (Legs)   REASON FOR EXAM:    recent ortho surgery, now with chest pain  COMMENTS:       PROCEDURE: [place]  - US DOPPLER LOW EXTR BILATERAL  - Apr 25 2013  5:35AM     CLINICAL DATA:  Recent orthopedic surgery; chest pain. Concern for  deep venous thrombosis.    EXAM:  VENOUS DOPPLER ULTRASOUND OF BILATERAL LOWER EXTREMITIES    TECHNIQUE:  Gray-scale sonography with graded compression, as well as  color  Doppler and duplex ultrasound, were performed to evaluate the deep  venous system from the level of the common femoral vein through the  popliteal and proximal calf veins. Spectral Doppler was utilized to  evaluate flow at rest and with distal augmentation maneuvers.    COMPARISON:  None.    FINDINGS:  Thrombus within deep veins:  None visualized.    Compressibility of deep veins:  Normal.    Duplex waveform respiratory phasicity:  Normal.    Duplex waveform response to augmentation:  Normal.    Venous reflux:  None visualized.  Other findings:  None visualized.     IMPRESSION:  No evidence of deep venous thrombosis; normal venous ultrasound.      Electronically Signed    By: Garald Balding M.D.    On: 04/25/2013 05:42         Verified By: JEFFREY . Radene Knee, M.D.,   ASSESSMENT/PLAN:  Assessment/Plan:    1.  Chest pain.  Patinet with known severe CAD  S/P CABG 1996 He has intermitt CP that does not appear to be an acute problem  Symptoms began over the past year.  On and off.  Somewhat atypical in that they canstart with rest, With activity do not get worse.  He does note some increased fatigue over past year  and some increased SOB with activity Diff from last year There is no pleuritic component   I think with elevated D Dimer it is not unreasonable to pursue PE though I think probably  low yield I would recomm at the least a noninvasive study to evaluate for coronary ischemia.  Would recomm a Lexiscan myoview.  Unfort V/Q scan woould interfere with this.  Would premed for CTA of lungs instead. If there is any change in stress test from test in 2010 then I would recomm L heart cath to define coronay anatomy and bypass grafts.    2.  HTN  Follow  Was labile at SNF     3.  HL  Keep on statin  4.  Hx cirrhosis with varices.  Keep on PPI.\  5.  Hx anxiety  Follow  Needs prn meds.    Electronic Signatures: Dorris Carnes (MD)  (Signed on 15-Nov-14  13:55)  Authored  Last Updated: 15-Nov-14 13:55 by Dorris Carnes (MD)

## 2014-10-01 NOTE — Op Note (Signed)
PATIENT NAME:  Alex [name], Ector H MR#:  [id number] DATE OF BIRTH:  Sep 18, 1932  DATE OF PROCEDURE:  04/05/2013  PREOPERATIVE DIAGNOSIS: Comminuted fracture shaft of the right tibia.   POSTOPERATIVE DIAGNOSIS:  Comminuted fracture shaft of the right tibia.   OPERATION:  Open reduction and internal fixation with a Biomet intramedullary tibial nail, 9 mm x 375 mm nail, locking screws proximally and distally.   SURGEON: Valinda HoarHoward E. Randell Detter, M.D.   ANESTHESIA: General endotracheal.   COMPLICATIONS: None.   DRAINS: None.   ESTIMATED BLOOD LOSS: 100 mL.   REPLACEMENT: None.   DESCRIPTION OF PROCEDURE: The patient was brought to the Operating Room where he underwent satisfactory general endotracheal anesthesia in the supine position. He was given 3 units of platelets preoperatively due to his thrombocytopenia and his use of aspirin and Plavix. The risks and benefits of surgery including infection and bleeding problems were discussed with the patient and his wife and daughter preoperatively. The right leg was prepped and draped in a sterile fashion and it was placed on the tibial distractor device. The foot was fixed to the device with Coban. X-ray showed good alignment and positioning.   A longitudinal incision was made over the proximal tibia just to the medial side of midline and dissection carried out sharply through the subcutaneous tissue and the fascia. Electrocautery was used for hemostasis. He did not have any excessive bleeding. His patellar tendon was retracted laterally and a guidepin inserted and was seen to be in good position on x-ray. A larger reamer was used to create an opening for the nail in the proximal tibia. The curved guide was inserted and a ball-tipped guide pin passed down the shaft of the femur easily all the way to the distal epiphyseal plate. The guide was removed. The guidewire measured about and 38.5 mm in length. Therefore, a 37.5 cm nail was chosen. The canal was reamed to 10.5  mm. A 9 mm x 375 mm nail was then inserted over the guide, which was then removed. The nail was seated proximally and distally and seemed to be in excellent position. The fracture was well aligned. The anteromedial oblique screw was drilled and filled with a 60 mm locked screw. A stab wound was made distally and medially over the tibia and a hole drilled and filled with a 40 mm cortical screw. Fluoroscopy showed that the screws were in good position.   The wounds were then irrigated and closed with 0 Vicryl on the deep fascia. Then 2-0 Vicryl was used on subcutaneous tissue. Staples were used on the skin. Dry sterile dressings with a well-padded posterior splint were applied. A tourniquet was not used. The patient was awakened and transferred to his hospital bed and taken to recovery in good condition.    ____________________________ Valinda HoarHoward E. Secundino Ellithorpe, MD hem:cs D: 04/05/2013 11:10:11 ET T: 04/05/2013 20:51:43 ET JOB#: [id number]  cc: Valinda HoarHoward E. Raymona Boss, MD, <Dictator> Valinda HoarHOWARD E Lloyd Ayo MD ELECTRONICALLY SIGNED 04/05/2013 23:56
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
## 2014-10-01 NOTE — Consult Note (Signed)
General Aspect Mr. Lanz is a 81yo gentleman with PMHx s/f CAD (s/p CABG in 1996, cath 03/2009 with nonobstructive dz), history of hepatitis C with liver cirrhosis and chronic thrombocytopenia (ITP), HTN, HLD, GERD, mild dementia and anxiety/depression who was admitted to [facility] this morning with atypical chest pain.   Present Illness Last cath 03/2009- severe native 3 v CAD s/p CABG with continued patency of all bypass grafts, nonobstructive 50% stenosis in prox SVG-dis RCA, normal LV function with estimated LVEF 60% TTE 08/2011-   LVEF >55%, impaired LV relaxation, normal RV systolic function, mild-mod TR, elevated RV systolic pressure at 23-53 mmHg.   MV 08/2011- EF 55%, no ischemia, low risk  He last saw Dr. Rockey Situ 04/2012 he had noted some dizziness and memory for which he was started on a medication for hepatic encephalopathy. Stopped metoprolol previously. HR and BP in the office were WNL. He was deemed stable overall from a cardiac perspective.  He reports experiencing intermittent anterior chest tightness x 1 month. These episodes occasionally occur with activity, but may occur at rest. 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 Over the past 3-4 months, he has noticed increased DOE at times, but again notes that this has not affected his activity level. More recently, the episodes of discomfort have become more frequent with radiation to his arms and face. No association with meals, position changes, deep inspiration or cough. No fevers or chills. No active bleeding. He and his wife went out to eat yesterday, he experienced a more severe episode and presented to the ED. NTG paste was applied and relieved his pain. He does note left facial "drawing," R arm numbness and imblance recently.   In the ED, EKG reveals NSR, RBBB with more pronounced anterolateral TWIs. There are ST changes at times, but the patient has LVH. Initial trop-I WNL x 2. CMET  unremarkable. CBC confirms chronic thrombocytopenia at 90K. Portable CXR with stable cardiomegaly, no active process. He was given a full-dose ASA and started on NTG paste. He has been admitted by the medicine service and continued on home medications including ASA 162, ARB, statin and NTG SL PRN. Telemetry review does indicate a self-terminating, transient episode of SVT. VSS.   Physical Exam:  GEN well developed, no acute distress   HEENT PERRL, hearing intact to voice   NECK supple  No masses  trachea midline   RESP normal resp effort  no use of accessory muscles  No wheezes, rales or rhonchi   CARD Regular rate and rhythm  Normal, S1, S2  III/VI systolic murmur at apex radiating to axilla   ABD denies tenderness  denies Flank Tenderness  normal BS  no Abdominal Bruits   EXTR negative cyanosis/clubbing, negative edema, left DP 1+, right RP 2+   SKIN normal to palpation, No rashes, No ulcers   NEURO R side weakness, L>RUE, L>RLE strength to bilatal hand squeeze, resisted elbow flexion/extension, resistent ankle flexion/extention, no facial droop or slurred speech   PSYCH A+O to time, place, person, slow in response to questions   Review of Systems:  Subjective/Chief Complaint chest pain   General: Fatigue   Skin: No Complaints   ENT: headache   Eyes: No Complaints   Neck: No Complaints   Respiratory: dyspnea on exertion   Cardiovascular: Chest pain or discomfort  Tightness  Palpitations   Gastrointestinal: No Complaints   Vascular: No Complaints   Musculoskeletal: No Complaints   Neurologic:  Dizzness  Headache     cirrhosis:    Arthritis:    GERD - Esophageal Reflux:    Depression:    Hypertension:    Anxiety:    Hepatitis C:    Thrombocytopenia:     Aspirin Chewable, 324 mg Oral once  - Indication: Pain/Fever/Thromboembolic Disorders/Post MI/Prophylaxis MI, 17-Aug-2012, Completed, Standard   Nitroglycerin 2% ointment, 1 inch(es) Topical  bid  -Indication:Angina/ Hypertension  Instructions:  Apply daily at 6 am, 12 noon and remove at 6 pm to begin 12 hour nitrate free period., 17-Aug-2012, Active, Standard   Nitroglycerin Ointment/Patch - REMOVAL, 6pm, 17-Aug-2012, Active, Standard   Ondansetron injection, ( Zofran injection )  4 mg, IV push, q4h PRN for Nausea/Vomiting  Indication: Nausea/ Vomiting, 18-Aug-2012, Active, Standard   Aspirin Enteric Coated tablet, ( Ecotrin)  162 mg Oral daily  - Indication: Pain/Fever/Thromboembolic Disorders/Post MI/Prophylaxis MI, 18-Aug-2012, Active, Standard   Irbesartan tablet, 150 mg Oral qam  - Indication: Hypertension/ Neuropathy (Type 2 Diabetes)/ Volume and Salt-Depleted Patients, 18-Aug-2012, Active, Standard   Sertraline tablet, ( Zoloft)  50 mg Oral qam  - Indication: Depression/ OCD/ PTSD/ Panic Disorder, 18-Aug-2012, Active, Standard   Simvastatin tablet, ( Zocor)  10 mg Oral w/supper  - Indication: Hypercholesterolemia, 18-Aug-2012, Active, Standard   ALPRAZolam tablet, 0.25 mg Oral bid PRN for anxiety  - Indication: Anxiety/ Depression/ Panic, 18-Aug-2012, Active, Standard   Galantamine tablet, ( Razadyne)  4 mg Oral bid/wm, 18-Aug-2012, Active, Standard   Memantine tablet, ( Namenda)  10 mg Oral bid  - Indication: Dementia of the Alzheimer's type, 18-Aug-2012, Active, Standard   Rifaximin tablet, ( Xifaxan)  550 mg Oral bid  - Indication: traveler's diarrhea, 18-Aug-2012, Active, Standard   Multivitamin tablet, ( Vitamin - Multiple)  1 tablet(s) Oral daily  - Indication: Prevention and Treatment of Vitamin Deficiencies, 18-Aug-2012, Active, Standard   Pantoprazole susp, ( Protonix Suspension )  40 mg Oral q6am  FOR ORAL ADMINISTRATION: Sprinkle intact granules on applesauce (swallow within 10 minutes) OR Empty granules in 87m Apple Juice, stir 5 seconds and swallow immediately.  Rinse container once or twice with apple juice and swallow immediately.,  18-Aug-2012, Active, Standard   Acetaminophen * tablet,  ( Tylenol (325 mg) tablet)  650 mg Oral once  - Indication: Pain/Fever, 18-Aug-2012, Completed, Standard  Home Medications: Medication Instructions Status  solvaldi 407mtablet 1 tab(s) orally once a day (in the evening) x 12 weeks. Active  Olysio 150 mg oral capsule 1 cap(s) orally once a day (in the evening) x 12 weeks Active  Namenda 10 mg oral tablet 1 tab(s) orally 2 times a day Active  galantamine 4 mg oral tablet 1 tab(s) orally 2 times a day Active  simvastatin 10 mg oral tablet 1 tab(s) orally once a day (in the evening) Active  irbesartan 150 mg oral tablet 1 tab(s) orally once a day (in the morning) Active  pantoprazole 40 mg oral delayed release tablet 1 tab(s) orally once a day (in the morning) for acid reflux. Active  aspirin 81 mg oral tablet 2 tab(s) orally once a day (in the morning) Active  sertraline 50 mg oral tablet 1 tab(s) orally once a day (in the morning) for depression. Active  multivitamin 1 tab(s) orally once a day (in the morning) Active  Nitrostat 0.4 mg sublingual tablet 1 tab(s) sublingual every 5 minutes up to 3 doses as needed for chest pain.  Active  alprazolam 0.25 mg oral tablet 1 tab(s) orally 2 times a  day as needed for anxiety, nervousness.  Active  Xifaxan 550 mg oral tablet 1 tab(s) orally 2 times a day Active   Lab Results:  Hepatic:  09-Mar-14 20:31   Bilirubin, Total 0.9  Alkaline Phosphatase  148  SGPT (ALT) 38  SGOT (AST) 37  Total Protein, Serum 7.3  Albumin, Serum 4.2  Routine Chem:  09-Mar-14 20:31   Glucose, Serum 80  BUN  19  Creatinine (comp) 0.99  Sodium, Serum  134  Potassium, Serum 3.8  Chloride, Serum 100  CO2, Serum 27  Calcium (Total), Serum 8.8  Osmolality (calc) 269  eGFR (African American) >60  eGFR (Non-African American) >60 (eGFR values <23m/min/1.73 m2 may be an indication of chronic kidney disease (CKD). Calculated eGFR is useful in patients with  stable renal function. The eGFR calculation will not be reliable in acutely ill patients when serum creatinine is changing rapidly. It is not useful in  patients on dialysis. The eGFR calculation may not be applicable to patients at the low and high extremes of body sizes, pregnant women, and vegetarians.)  Anion Gap 7  Cardiac:  09-Mar-14 20:31   Troponin I 0.04 (0.00-0.05 0.05 ng/mL or less: NEGATIVE  Repeat testing in 3-6 hrs  if clinically indicated. >0.05 ng/mL: POTENTIAL  MYOCARDIAL INJURY. Repeat  testing in 3-6 hrs if  clinically indicated. NOTE: An increase or decrease  of 30% or more on serial  testing suggests a  clinically important change)  10-Mar-14 04:58   Troponin I 0.03 (0.00-0.05 0.05 ng/mL or less: NEGATIVE  Repeat testing in 3-6 hrs  if clinically indicated. >0.05 ng/mL: POTENTIAL  MYOCARDIAL INJURY. Repeat  testing in 3-6 hrs if  clinically indicated. NOTE: An increase or decrease  of 30% or more on serial  testing suggests a  clinically important change)  Routine Hem:  09-Mar-14 20:31   WBC (CBC) 5.6  RBC (CBC) 4.80  Hemoglobin (CBC) 16.5  Hematocrit (CBC) 44.8  Platelet Count (CBC)  70  MCV 93  MCH  34.4  MCHC  36.8  RDW  14.7  Neutrophil % 61.0  Lymphocyte % 26.1  Monocyte % 7.6  Eosinophil % 4.8  Basophil % 0.5  Neutrophil # 3.4  Lymphocyte # 1.5  Monocyte # 0.4  Eosinophil # 0.3  Basophil # 0.0 (Result(s) reported on 17 Aug 2012 at 08:53PM.)   EKG:  Interpretation NSR, RBBB, deep TWIs V1-V6, I, aVL, more pronounced from prior tracings   Rate 80   Radiology Results: XRay:    09-Mar-14 20:36, Chest Portable Single View  Chest Portable Single View   REASON FOR EXAM:    cp  COMMENTS:       PROCEDURE: DXR - DXR PORTABLE CHEST SINGLE VIEW  - Aug 17 2012  8:36PM     RESULT: The cardiac silhouette is borderline to mildly enlarged.   Sternotomy wires are present. The lungs are clear. The bony structures   are  unremarkable.    IMPRESSION:   1. Stable cardiomegaly. No acute cardiopulmonary disease evident.    Dictation Site: 1      Verified By: GSundra Aland M.D., MD    Cipro: Unknown  Morphine: Unknown  Contrast dye: Unknown  Effexor: Unknown  Aggrenox: Unknown   Impression Mr. STisdaleis a 89yogentleman with PMHx s/f CAD (s/p CABG in 1996, cath 03/2009 with nonobstructive dz), history of hepatitis C with liver cirrhosis and chronic thrombocytopenia (ITP), HTN, HLD, GERD, mild dementia and anxiety/depression who was admitted  to [facility] this morning with atypical chest pain.   Plan 1. Precordial pain 2. CAD s/p CABG 18 years ago 3. SVT 4. R arm numbness/left facial paresthesias/imbalance 5. HTN 6. HLD 7. GERD 8. Hepatitis C with liver cirrhosis 9. ITP 10. Mild dementia 11. Anxiety/depression  Patient's description of chest pain has atypical features consistent.  moderate level of exertion without incident.  reassuring sign is that his echo and Myoview this time last year did not support evidence of ischemia.  Agree with formal rule out. cardiac enz neg x 2  allergy to aggrenox, contrast dye and morphine. --If enz neg x 3, would d/c home with follow up. If he continues to have chest pain at home, could set up repeat stress test, even cardiac cath.  --Dementia a significant problem, challenging history. Wife reports it is getting worse. --Some SVT on monitor, short. Not able to tolerate b-blocker. Could consider outpt monitoring of there is a suggestion of more arrhythmia. Will set up follow up in clinic   Electronic Signatures: Arguello, Lyda Perone (PA-C)  (Signed 10-Mar-14 11:59)  Authored: General Aspect/Present Illness, History and Physical Exam, Review of System, Past Medical History, Orders, Home Medications, Labs, EKG , Radiology, Allergies, Impression/Plan Ida Rogue (MD)  (Signed 10-Mar-14 14:24)  Authored: History and Physical Exam, Impression/Plan  Co-Signer:  General Aspect/Present Illness, Home Medications, Allergies, Impression/Plan   Last Updated: 10-Mar-14 14:24 by Ida Rogue (MD)

## 2014-10-01 NOTE — H&P (Signed)
PATIENT NAME:  Alex [name], Alex [name] MR#:  [id number] DATE OF BIRTH:  09/16/1932  DATE OF ADMISSION:  04/25/2013  PRIMARY CARE PHYSICIAN: Dr. Francia Greavesheryl Jeffries.   REFERRING PHYSICIAN: Dr. Sharyn CreamerMark Quale.   CHIEF COMPLAINT: Chest discomfort.   HISTORY OF PRESENT ILLNESS: The patient is a [age] year old, pleasant white male with a history of hypertension, hepatitis C, thrombocytopenia, gastroesophageal reflux disease, who recently had right tibial and fibular comminuted fracture on the right side. The patient underwent open reduction and internal fixation of the fracture. Postsurgically, patient had poor progression with rehab, considering this patient was discharged to a nursing home. Per patient, patient  was having fluctuating blood pressure, complains of some discomfort. The patient is also experiencing vague chest discomfort. Concerning this, the patient was sent to the Emergency Department. Work-up in the Emergency Department revealed the patient had mild elevation of the D-dimer of 2.5. The patient has a history of allergy to IV contrast; considering this, the decision was made to admit the patient to the medicine service and do further work-up in regards to acute coronary syndrome as well as rule out PE. The patient is not tachycardic, not tachypneic. Oxygen saturations are 98% on room air. Currently denies having any chest pain.   PAST MEDICAL HISTORY: 1.  History of cirrhosis.  2.  Osteoarthritis. 3.  Gastroesophageal reflux disease. 4.  Depression.  5.  Hypertension. 6.  Anxiety. 7.  Hepatitis C. 8.  Thrombocytopenia.  9.  Left lower extremity tibial fibular fracture status post surgery.   ALLERGIES:  1.  CIPRO.  2.  MORPHINE.  3.  CONTRAST DYE.  4.  EFFEXOR. 5.  AGGRENOX.   HOME MEDICATIONS:  1.  (Dictation Anomaly)  2.  Simvastatin 10 mg once a day.  3.  Sertraline 50 mg once daily. 4.  Protonix 40 mg once a day. 5.  Nitrostat 0.4 mg sublingual every 5 minutes. 6.  Namenda XR 21 mg once  a day.  7.  Multivitamin 1 tablet once a day.  8.  Irbesartan 150 mg daily once a day. 9.  Galantamine 200 mg 2 times a day.  10.  Ferrous sulfate 325 mg 3 times a day. 11.  Calcium 600, with vitamin D oral 2 times a day.  12.  Aspirin 162 mg once a day. 13.  Alprazolam 0.5 mg 3 times a day.   SOCIAL HISTORY: The patient states smoked remotely. Denies drinking alcohol or using illicit drugs. Married; lives with his wife. Going to a nursing home. 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   FAMILY HISTORY: Mother suffered from Alzheimer dementia. Father died from motor vehicle accident.   REVIEW OF SYSTEMS: CONSTITUTIONAL: Has been experiencing generalized weakness.  EYES: No change in vision.  ENT: No change in hearing.  RESPIRATORY:  No cough, shortness of breath.  CARDIOVASCULAR: Chest pain. No palpitations or shortness of breath.  GASTROINTESTINAL: No nausea, vomiting, abdominal pain.  GENITOURINARY:  No dysuria or hematuria.  ENDOCRINE: No polyuria, polydipsia.  HEMATOLOGIC: No easy bruising or bleeding.  SKIN: No rash or lesions.  MUSCULOSKELETAL: Has a history of osteoarthritis. NEUROLOGIC:  No weakness or numbness in any part of the body.   PHYSICAL EXAMINATION: GENERAL: This is well-built, well-nourished, age-appropriate male, lying down in the bed, not in distress.  VITAL SIGNS: Temperature 98.1, pulse 60, blood pressure 129/73, respiratory rate of 18, oxygen saturation 96% on room air.  HEENT: Head normocephalic, atraumatic. There is no sclerae icterus. Conjunctivae normal.  Pupils equal and react to light. Mucous membranes moist. No pharyngeal erythema.  NECK: Supple. No lymphadenopathy. No JVD. No carotid bruit.  CHEST: Has no focal tenderness.  LUNGS: Bilaterally clear to auscultation.  HEART: S1, S2 regular. No murmurs are heard.  ABDOMEN: Bowel sounds present. Soft, nontender, nondistended. Could not appreciate any hepatosplenomegaly.  EXTREMITIES:  Right lower extremity is in a boot. Left leg: No pedal edema. Pulses 2+.  SKIN: No rash or lesions.  MUSCULOSKELETAL: Good range of motion in all the extremities except did not check in the right leg.  NEUROLOGIC: The patient is somewhat somnolent; however, oriented to place, person and time. No apparent cranial nerve abnormalities. Motor 5/5 in upper and lower extremities.   LABORATORY DATA: CK 47, PT 13, INR of 1, PTT 33. D-dimer 2.05. CBC: WBC of 5.2, hemoglobin 12.8, platelet count of 119s. BMP is completely within normal limits.   ASSESSMENT AND PLAN: The patient is a 81 year old male who comes to the Emergency Department with chest discomfort.  1.  Chest pain: The patient has a known history of coronary artery disease, per patient had bypass surgery about 15 to 20 years back. Will rule out cardiac enzymes. The other possibility of possibly pulmonary embolism seems to be unlikely. The patient is not tachypneic, not tachycardic, good oxygen saturations; however, the patient is currently getting lower extremity Dopplers. Will also obtain a VQ scan.  2.  Hypertension, currently well-controlled. Continue the home medications.  3.  History of coronary artery disease. Continue with aspirin, simvastatin. The patient's heart rate is well within normal limits of 60. Also, the patient is irbesartan.  4.  Currently we will keep the patient on deep vein thrombosis prophylaxis with Lovenox and if the patient has positive for pulmonary embolism then will start the patient on therapeutic dose of Lovenox.    ____________________________ Susa Griffins, MD pv:cg D: 04/25/2013 04:36:24 ET T: 04/25/2013 05:27:40 ET JOB#: [id number]  cc: Susa Griffins, MD, <Dictator> Susa Griffins MD ELECTRONICALLY SIGNED 05/10/2013 0:36

## 2014-10-01 NOTE — Discharge Summary (Signed)
PATIENT NAME:  Alex [name], Alex [name] MR#:  [id number] DATE OF BIRTH:  12-10-1932  DATE OF ADMISSION:  08/17/2012 DATE OF DISCHARGE:  08/18/2012  DISCHARGE DIAGNOSES:  1.  Atypical chest pain. 2.  Arteriosclerotic cardiovascular disease. 3.  Hepatitis C related cirrhosis.  4.  Chronic thrombocytopenia.  5.  Vascular dementia.   DISCHARGE MEDICATIONS: Pantoprazole 40 mg daily, alprazolam 0.25 mg daily p.r.n., Avapro 150 mg daily, aspirin 81 mg daily, simvastatin 20 mg 1/2 tab at bedtime, Namenda 10 mg b.i.d., galantamine 4 mg b.i.d., Zoloft 50 mg daily, hepatitis C drug daily.   REASON FOR ADMISSION: Elderly male presents with chest pain. Please see [name] and P for HPI, past medical history and physical exam.   HOSPITAL COURSE: The patient was admitted, ruled out for MI. He had no more symptoms. He had 1 bout of SVT followed by some bradycardia, otherwise asymptomatic. Dr. Mariah MillingGollan of cardiology came by and thought he was free to go. I suspect this was related to some of his new medications for his hep C. He will hold the Xifaxan. Ultimately could be flushing from the Zoloft, but I think it is more related to his new hep C drugs. He will follow up with me in 1 week.  PROGNOSIS: Guarded.    ____________________________ Danella PentonMark F. Miller, MD mfm:jm D: 08/19/2012 17:43:05 ET T: 08/19/2012 18:39:02 ET JOB#: [id number]  cc: Danella PentonMark F. Miller, MD, <Dictator> Danella PentonMARK F MILLER MD ELECTRONICALLY SIGNED 08/20/2012 7:59

## 2014-10-01 NOTE — Consult Note (Signed)
Chief Complaint:  Subjective/Chief Complaint Patient felt bad after CT  Tingly in hands feet and face. Has has some chest tightness -- hstory is very difficult Had reaction to med ? pain med No CP now  No SOB  Feeling better   VITAL SIGNS/ANCILLARY NOTES: **Vital Signs.:   16-Nov-14 11:25  Vital Signs Type Routine  Temperature Temperature (F) 98  Celsius 36.6  Temperature Source oral  Pulse Pulse 61  Respirations Respirations 18  Systolic BP Systolic BP 129  Diastolic BP (mmHg) Diastolic BP (mmHg) 71  Mean BP 90  Pulse Ox % Pulse Ox % 96  Pulse Ox Activity Level  At rest  Oxygen Delivery Room Air/ 21 %   Physical Exam:  GEN no acute distress   CARD regular rate  no JVD   ABD denies tenderness   EXTR negative edema   Additional Comments Tele:  SR  Short burst of PAT   Lab Results: Thyroid:  16-Nov-14 04:32   Thyroid Stimulating Hormone  5.68 (0.45-4.50 (International Unit)  ----------------------- Pregnant patients have  different reference  ranges for TSH:  - - - - - - - - - -  Pregnant, first trimetser:  0.36 - 2.50 uIU/mL)  Routine Chem:  16-Nov-14 04:32   Cholesterol, Serum 153  Triglycerides, Serum 104  HDL (INHOUSE)  36  VLDL Cholesterol Calculated 21  LDL Cholesterol Calculated 96 (Result(s) reported on 26 Apr 2013 at 12:23PM.)  Magnesium, Serum 1.8 (1.8-2.4 THERAPEUTIC RANGE: 4-7 mg/dL TOXIC: > 10 mg/dL  -----------------------)  Glucose, Serum 97  BUN 16  Creatinine (comp) 0.89  Sodium, Serum 138  Potassium, Serum 3.7  Chloride, Serum 105  CO2, Serum 29  Calcium (Total), Serum 8.9  Anion Gap  4  Osmolality (calc) 277  eGFR (African American) >60  eGFR (Non-African American) >60 (eGFR values <60mL/min/1.73 m2 may be an indication of chronic kidney disease (CKD). Calculated eGFR is useful in patients with stable renal function. The eGFR calculation will not be reliable in acutely ill patients when serum creatinine is changing rapidly. It  is not useful in  patients on dialysis. The eGFR calculation may not be applicable to patients at the low and high extremes of body sizes, pregnant women, and vegetarians.)  Routine Hem:  16-Nov-14 04:32   WBC (CBC) 5.8  RBC (CBC)  3.88  Hemoglobin (CBC)  12.6  Hematocrit (CBC)  36.1  Platelet Count (CBC)  106  MCV 93  MCH 32.6  MCHC 35.0  RDW  16.4  Neutrophil % 61.3  Lymphocyte % 23.0  Monocyte % 7.1  Eosinophil % 8.0  Basophil % 0.6  Neutrophil # 3.5  Lymphocyte # 1.3  Monocyte # 0.4  Eosinophil # 0.5  Basophil # 0.0 (Result(s) reported on 26 Apr 2013 at 05:05AM.)   Assessment/Plan:  Assessment/Plan:  Assessment 1.  CP  R/O for MI  CT negative Plan for Lexiscan myoview tomorrow  2.  CAD  As noted above.   Electronic Signatures: Ross, Paula (MD)  (Signed 16-Nov-14 13:55)  Authored: Chief Complaint, VITAL SIGNS/ANCILLARY NOTES, Physical Exam, Lab Results, Assessment/Plan   Last Updated: 16-Nov-14 13:55 by Ross, Paula (MD) 
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
## 2014-10-01 NOTE — Discharge Summary (Signed)
PATIENT NAME:  Alex [name], Alex [name] MR#:  [id number] DATE OF BIRTH:  June 02, 1933  DATE OF ADMISSION:  04/25/2013 DATE OF DISCHARGE:  04/27/2013  ADMITTING DIAGNOSIS: Chest pain.   DISCHARGE DIAGNOSES: 1.  Left neck and head as well as shoulder and chest pain likely due to degenerative disk disease. Myoview is low risk. Ejection fraction 57%.  2.  CT of chest with IV contrast was negative for pulmonary embolism. 3.  Poorly controlled hypertension. 4.  Gastroesophageal disease. 5.  Hyperlipidemia with LDL of 96.  6.  Paroxysmal atrial tachycardia, resolved on beta blockers. 7.  History of liver cirrhosis.  8.  Anemia.  9.  Thrombocytopenia. 10.  Right tibial fracture status post open reduction and internal fixation on the 26th of October 2014 by [name]. 11.  Anxiety and depression. 12.  Arthritis. 13.  Hepatitic C.   DISCHARGE CONDITION: Stable.   DISCHARGE MEDICATIONS: The patient is to continue: 1.  Simvastatin 10 mg p.o. daily. 2.  Irbesartan 150 mg p.o. daily. 3.  Pantoprazole 40 mg p.o. daily. 4.  Aspirin 81 mg tablets 2 tablets once daily.  5.  Multivitamins once daily. 6.  Nitrostat 0.4 mg sublingually every 5 minutes as needed.  7.  Galantamine 8 mg twice daily.  8.  Namenda XR 21 mg p.o. daily.  9.  Sertraline 50 mg p.o. daily.  10.  Gabapentin 100 mg p.o. 2 capsules twice daily which would be 200 mg p.o. twice daily.  11.  Xifaxan 550 mg p.o. twice daily.  12.  Iron sulfate 325 mg p.o. 3 times daily.  13.  Alprazolam 0.5 mg p.o. 3 times daily as needed.  14.  Calcium with vitamin D 600/200 one tablet twice daily.  15.  Nadolol 20 mg p.o. once daily. This is a new medication. 16.  Acetaminophen which is Tylenol 325 mg 1 tablet every 4 hours as needed.   HOME OXYGEN: None.   DIET: 2 grams salt, low fat, low cholesterol, regular consistency.   ACTIVITY LIMITATIONS: As tolerated.   REFERRALS: To physical therapy.  Recommended 25% weight-bearing on the right lower  extremity.  FOLLOWUP APPOINTMENTS: With [name] in 2 days after discharge, [name] in 2 weeks after discharge, as per scheduled appointment.   CONSULTANTS: Care management, social work, Surveyor, miningphysician's assistant, [name], as well as [name], cardiologist.   RADIOLOGIC STUDIES: Chest x-ray PA and lateral, 14th of November 2014, revealed no acute cardiopulmonary process. CT scan of chest with IV contrast to rule out pulmonary embolism, 15th of November 2014, revealed no evidence of pulmonary embolus. No acute findings. Mild splenomegaly was noted. Bilateral lower extremity Doppler ultrasound, 15th of November 2014, revealed no evidence of DVT, normal venous ultrasound. Tibia and fibula right x-ray, 16th of November 2014, showed partial healing of distal fibula fracture status post internal fixation. No change in distal fibula fracture, according to radiologist. Myoview stress test, 17th of November 2014, revealed no significant wall motion abnormality noted. Overall low risk scan. Pharmacological myocardial perfusion study with no significant ischemia. Significant baseline ST/TW changes in the anterior as well as anterolateral leads. The estimated ejection fraction was 57%. Left ventricular global function was normal. There are equivocal EKG changes. There is attenuation artifact noted on this study, read by [name].  HISTORY OF PRESENT ILLNESS: The patient is a 81 year old Caucasian male with history of recent right tibial fracture status post ORIF who presented to the hospital with complaints of chest  pain, chest discomfort. Please refer to [name] admission note done on the 15th of November 2014. On arrival to the Emergency Room, the patient's temperature was 98.1, pulse was 60, respiration rate was 18, blood pressure 129/73, and saturation was 96% on room air. Physical examination was unremarkable.   The patient's lab data done in the Emergency Room showed mild  elevation of glucose to 103, otherwise unremarkable study. Cardiac enzymes x3 were within normal limits. The patient's white blood cell count was normal at 5.2, hemoglobin was 12.8, and platelet count was 119. Coagulation panel was unremarkable; however, the patient's d-dimer was high at 2.05. EKG showed normal sinus rhythm at 62 beats per minute, left axis deviation, right bundle branch block, LVH with repolarization abnormality.   HOSPITAL COURSE: The patient was admitted to the hospital for further evaluation. His cardiac enzymes were cycled and consultation with cardiologist was obtained. Cardiologist, [name], saw the patient in consultation and recommended functional study, stress test, on the 17th of November 2014. As the patient had recent leg injury, we also investigated him for possible pulmonary embolism, which was not found. No DVT was also found on lower extremity Doppler ultrasound. The patient underwent Myoview stress test today, on the 17th of November 2014, and since it is negative we feel that there is low probability of cardiac or pulmonary event and very likely the patient's discomfort, which was in his neck mostly as well as his shoulder and face, could be explained by degenerative disk disease. The patient was advised to continue Tylenol as needed for pain. He is to follow up with his primary care physician, Dr. Bethann Punches, for further recommendations. In regards to hypertension, it was felt to be poorly controlled. Intermittently the patient's blood pressure would go up as high as 150s to 160s. The patient was initiated on nadolol because he developed an episode of PAT while in the hospital. It is recommended to follow the patient's heart rate, especially since his heart rate at baseline is low, and his blood pressure readings as outpatient and make decisions about decreasing nadolol dose if needed. We checked the patient's TSH, which was found to be elevated; however, the patient's free  T4 level was normal. In regards to other chronic medical problems such as gastroesophageal reflux disease or hyperlipidemia, the patient is to continue his outpatient management. The patient's LDL was found to be 96. For right tibial fracture, we asked the orthopedic surgeon how should we advance physical therapy and physical therapy was recommended to be advanced to 25% of weight-bearing on the right leg. For other chronic medical issues such as arthritis, anxiety, and depression, the patient is to continue his outpatient management. On the day of discharge, the patient felt satisfactory, did not complain of any significant chest discomfort. His vitals were stable with temperature of 98, pulse was 55, respiration was 18 to 19, blood pressure 103 to 140s systolic and 40s to 70s diastolic, and O2 sats were 96% to 98% on room air at rest.   TIME SPENT: 40 minutes. ____________________________ Katharina Caper, MD rv:sb D: 04/27/2013 13:51:00 ET T: 04/27/2013 14:18:38 ET JOB#: [id number]  cc: Katharina Caper, MD, <Dictator> Danella Penton, MD Ruchi Stoney MD ELECTRONICALLY SIGNED 05/11/2013 19:19
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
## 2014-10-03 NOTE — Consult Note (Signed)
General Aspect 81 year old male with past medical history of coronary artery disease status post coronary artery bypass graft, hypertension, hyperlipidemia,  last catheterization in October 2010 at which time he was found to have a 50% stenosis in the saphenous vein graft to distal RCA, other grafts patent,  presenting with three weeks of chest discomfort, SOB Cardiology was consulted for chest pain.  He reports having sharp chest pain sx that were very short in duration. Some shortness of breath. ain sx were located on the left-sided. He has been active in his garden, mowing and putting down fertilizer. He does have some sinus drainage. He reports having some left arm pain, num bness and tingling   He came to the ER today because he has been having progressively worsening left arm and shoulder heaviness associated with some nausea, lightheadedness and dizziness today.    Present Illness .  ALLERGIES: Cipro, morphine, contrast dye including MRI contrast dye, Effexor, Aggrenox.   PAST MEDICAL HISTORY:  1. Hepatitis C. 2. Coronary artery disease. 3. Gastroesophageal reflux disease. 4. Degenerative joint disease with spurs in neck and hands; severe degenerative joint disease in the right knee. 5. Idiopathic thrombocytopenia purpura. 6. Hypertension. 7. Coronary artery disease, status post coronary artery bypass graft.  8. History of kidney stones. 9. Hyperlipidemia. 10. Depression. 11. Transient ischemic attack. 12. Mild dementia.   PAST SURGICAL HISTORY:  1. Coronary artery bypass graft in 1996. 2. Right femur surgically repaired in [place]. 3. Bilateral cataract surgeries.  4. Right knee arthroscopy. 5. EGD 2010 showed hiatal hernia, erosive gastritis, duodenitis.  6. Colonoscopy 2010 showed diverticulosis, internal hemorrhoids. 7. Last catheterization 2010 showed 50% SVG to distal RCA.    FAMILY HISTORY: Mother had Alzheimer???s dementia. Father died of motor vehicle accident.     SOCIAL HISTORY: Denies any smoking, alcohol or drug abuse. He is married and lives with his wife.   Physical Exam:   GEN WD, WN, NAD    HEENT red conjunctivae    NECK No masses    RESP normal resp effort  clear BS    CARD Regular rate and rhythm  No murmur    ABD denies tenderness  soft    LYMPH negative neck    EXTR negative edema    SKIN normal to palpation    NEURO motor/sensory function intact    PSYCH alert, A+O to time, place, person, good insight   Review of Systems:   Subjective/Chief Complaint chest pain, SOB, left arm pain    General: Fatigue  Weakness    Skin: No Complaints    ENT: No Complaints    Eyes: No Complaints    Neck: No Complaints    Respiratory: No Complaints    Cardiovascular: Chest pain or discomfort  Dyspnea    Gastrointestinal: No Complaints    Genitourinary: No Complaints    Vascular: No Complaints    Musculoskeletal: No Complaints    Neurologic: No Complaints    Hematologic: No Complaints    Endocrine: No Complaints    Psychiatric: No Complaints    Review of Systems: All other systems were reviewed and found to be negative    Medications/Allergies Reviewed Medications/Allergies reviewed        Admit Reason:   Chest pain: (786.50) Active, ICD9, Unspecified chest pain  Home Medications: Medication Instructions Status  alprazolam 0.25 mg oral tablet 1 tab(s)   as needed   Active  multi-vite 1 cap(s) po once a day  Active  Aspir-Low 81  mg oral enteric coated tablet 1 tab(s) po once a day  Active  pantoprazole 40 mg oral enteric coated tablet 1 tab(s)   as needed   Active  fexofenadine 180 mg oral tablet 1 tab(s) po once a day  Active  metoprolol succinate 25 mg oral tablet, extended release 1 tab(s) po once a day  Active  Namenda 10 mg oral tablet 1 tab(s) orally 2 times a day Active  fluticasone 100 mcg inhalation powder puff(s) inhaled once a day, As Needed Active  Align 4 mg oral capsule 1 cap(s) orally once  a day Active  irbesartan 150 mg oral tablet 0.5 tab(s) orally once a day Active  metoprolol tartrate 25 mg oral tablet 0.5 tab(s) orally once a day (at bedtime) Active  sertraline 50 mg oral tablet 0.5 tab(s) orally once a day (before a meal) Active  simvastatin 10 mg oral tablet 0.5 tab(s) orally once a day (at bedtime) Active     Cardiac:  19-Mar-13 01:05    CK, Total 52   CPK-MB, Serum 1.4  Routine Chem:  19-Mar-13 01:05    Glucose, Serum 102   BUN 18   Creatinine (comp) 1.13   Sodium, Serum 145   Potassium, Serum 4.2   Chloride, Serum 105   CO2, Serum 28   Calcium (Total), Serum 8.5   Osmolality (calc) 291   eGFR (African American) >60   eGFR (Non-African American) >60   Anion Gap 12  Routine Hem:  19-Mar-13 01:05    WBC (CBC) 5.6   RBC (CBC) 4.10   Hemoglobin (CBC) 13.6   Hematocrit (CBC) 40.1   Platelet Count (CBC) 59   MCV 98   MCH 33.1   MCHC 33.9   RDW 14.5  Cardiac:  19-Mar-13 01:05    Troponin I 0.02  Routine Hem:  19-Mar-13 01:05    Neutrophil % 56.6   Lymphocyte % 28.8   Monocyte % 7.7   Eosinophil % 6.5   Basophil % 0.4   Neutrophil # 3.2   Lymphocyte # 1.6   Monocyte # 0.4   Eosinophil # 0.4   Basophil # 0.0  Routine Chem:  19-Mar-13 01:05    Magnesium, Serum 1.7   EKG:   Interpretation EKG shows NSR with RBBB, diffuse T wave ABN V3 to V6, II, III, AVF   Radiology Results: XRay:    18-Mar-13 09:43, Chest Portable Single View   Chest Portable Single View    REASON FOR EXAM:    cp  COMMENTS:       PROCEDURE: DXR - DXR PORTABLE CHEST SINGLE VIEW  - Aug 27 2011  9:43AM     RESULT:     Findings: There is no evidence of focal infiltrates, effusions or edema.   The patient is status post median sternotomy andcoronary bypass   grafting. The cardiac silhouette is enlarged indicative of cardiomegaly.    IMPRESSION:      Cardiomegaly without evidence of acute cardiopulmonary disease.    Thank you for the opportunity to contribute to  the care of your patient.          Verified By: Mikki Santee, M.D., MD  Cardiology:    18-Mar-13 14:23, Echo Doppler   Echo Doppler    Interpretation Summary    Left ventricular systolic function is normal. Ejection Fraction =   >55%. The transmitral spectral Doppler flow pattern is suggestive of   impaired LV relaxation. The right ventricular systolic function is  normal. The left atrial size is normal. There is mild to moderate   tricuspid regurgitation. Right ventricular systolic pressure is   elevated at 30-49mHg.    PatientHeight: 175 cm    PatientWeight: 77 kg    BSA: 1.9 m2    Procedure:    The study was done portably.    A two-dimensional transthoracic echocardiogram with color flow and   Doppler was performed.    Left Ventricle    The left ventricular wall motion is normal.    Left ventricular systolic function is normal.    Ejection Fraction = >55%.    The transmitral spectral Doppler flow pattern is suggestive of   impaired LV relaxation.    The left ventricle is normal in size.    There is mild concentric left ventricular hypertrophy.    Proximal septal thickening is noted.    Right Ventricle    The rightventricle is normal size.    The right ventricular systolic function is normal.    Atria    The left atrial size is normal.    Right atrial size is normal.    There is no Doppler evidence for an atrial septal defect.    Mitral Valve    The mitral valve is grossly normal.    There is no mitral valve stenosis.    There is mild mitral regurgitation.    Tricuspid Valve    The tricuspid valve is normal.    There is mild to moderate tricuspid regurgitation.    Right ventricular systolic pressure is elevated at 30-442mg.    Aortic Valve    The aortic valve opens well.    No hemodynamically significant valvular aortic stenosis.    No aortic regurgitation is present.    Pulmonic Valve    The pulmonic valve leaflets are thin and pliable; valve  motion is   normal.    Trace pulmonic valvular regurgitation.    Great Vessels    The aortic root is normal size.    The pulmonary artery is not well visualized, but is probably normal   size.    Pericardium/Pleural    There is no pleural effusion.    No pericardial effusion.    MMode 2D Measurements and Calculations    RVDd: 2.4 cm    IVSd: 1.6 cm    LVIDd: 4.4 cm    LVIDs: 3.2 cm    LVPWd: 1.5 cm    FS: 28 %    EF(Teich): 54 %    Ao root diam: 3.4 cm    LA dimension: 3.3 cm    LVOT diam: 2.0 cm    LVAdap4: 25 cm2    LVLd ap4: 7.7 cm    EDV(MOD-sp4): 67 ml    EDV(sp4-el): 68 ml    LVAs ap4: 13 cm2    LVLs ap4: 1.9 cm    ESV(MOD-sp4): 24 ml    ESV(sp4-el): 73 ml    EF(MOD-sp4): 64 %    EF(sp4-el): -8.5 %    SV(MOD-sp4): 42 ml    SV(sp4-el): -5.7 ml    Doppler Measurements and Calculations    MV E point: 116 cm/sec    MV A point: 87 cm/sec    MV E/A: 1.3     MV dec time: 0.22 sec    Ao V2 max: 145 cm/sec    Ao max PG: 8.0 mmHg    AVA(V,D): 2.9 cm2    LV max PG: 7.0 mmHg    LV V1 max:  134 cm/sec   PA V2 max: 138 cm/sec    PA max PG: 8.0 mmHg    TR Max vel: 269 cm/sec    TR Max PG: 29 mmHg    RVSP: 34 mmHg    RAP systole: 5.0 mmHg    Reading Physician: Ida Rogue   Sonographer: Sherrie Sport  Interpreting Physician:  Ida Rogue,  electronically signed on   08-28-2011 07:37:33  Requesting Physician: Ida Rogue    Cipro: Unknown  Morphine: Unknown  Contrast dye: Unknown  Effexor: Unknown  Aggrenox: Unknown  Vital Signs/Nurse's Notes: **Vital Signs.:   19-Mar-13 05:10   Vital Signs Type Q 4hr   Temperature Temperature (F) 97.7   Celsius 36.5   Temperature Source axillary   Pulse Pulse 52   Pulse source per Dinamap   Respirations Respirations 20   Systolic BP Systolic BP 93   Diastolic BP (mmHg) Diastolic BP (mmHg) 50   Mean BP 64   BP Source Dinamap   Pulse Ox % Pulse Ox % 92   Pulse Ox Activity Level  At rest   Oxygen Delivery  Room Air/ 21 %     Impression 81 year old male with past medical history of coronary artery disease status post coronary artery bypass graft, hypertension, hyperlipidemia,  last catheterization in October 2010 at which time he was found to have a 50% stenosis in the saphenous vein graft to distal RCA, other grafts patent,  presenting with three weeks of chest discomfort, SOB.  A/: 1) Chest pain EKG with significant unchanged abn, seen previously cardiac enz unchanged. ECHO essentialy normal apart from mildly elevated RVSP stress test pending. Some typical and aypical features If stress is negative, could consider outpt workup for neck ad arm discomfort  2) Hyperlipidemia would continue outpt meds  3) HTN: Well controled on current meds   Electronic Signatures: Ida Rogue (MD)  (Signed 19-Mar-13 09:03)  Authored: General Aspect/Present Illness, History and Physical Exam, Review of System, Health Issues, Home Medications, Labs, EKG , Radiology, Allergies, Vital Signs/Nurse's Notes, Impression/Plan   Last Updated: 19-Mar-13 09:03 by Ida Rogue (MD)

## 2014-10-03 NOTE — Discharge Summary (Signed)
PATIENT NAME:  Alex [name], [name] MR#:  [id number] DATE OF BIRTH:  [birth year]  DATE OF ADMISSION:  08/27/2011 DATE OF DISCHARGE:  08/28/2011  DISCHARGE DIAGNOSES:  1. Atypical chest pain, noncardiac.  2. Hypertension.  3. Gastroesophageal reflux disease. 4. Nephrolithiasis.  5. Depression/anxiety.  6. Back pain.   DISCHARGE MEDICATIONS:  1. Xanax 0.25 mg daily p.r.n.  2. Aspirin 81 mg daily.  3. Protonix 40 mg daily.  4. Allegra 180 mg daily.  5. Metoprolol succinate 25 mg daily.  6. Namenda 10 mg b.i.d.  7. Flonase two sprays daily.  8. Align daily.  9. Avapro 150 mg half tab daily.  10. Metoprolol tartrate 25 mg at bedtime.  11. Sertraline 50 mg half tab daily.  12. Simvastatin 10 mg half tab daily.   REASON FOR ADMISSION: The patient is a 81 year old male who presented with chest pain. Please see history and physical for history of present illness, past medical history, and physical exam.   HOSPITAL COURSE: The patient was admitted, ruled out for MI. He noted some arm numbness, shoulder pain, seemed musculoskeletal but could not be reproduced on exam. He was chest pain free this morning when I saw him. Stress Myoview normal. Really no sign for acute coronary syndrome. Will go home asymptomatic.   FOLLOW-UP: Follow-up with Dr. Hyacinth MeekerMiller on Friday to figure out any further symptoms, very possibly cervical disk or cervicalgia related.   ____________________________ Danella PentonMark F. Ezmeralda Stefanick, MD mfm:drc D: 08/28/2011 15:27:27 ET T: 08/29/2011 10:25:51 ET JOB#: [id number]  cc: Danella PentonMark F. Lisa Milian, MD, <Dictator> Anni Hocevar Sherlene ShamsF Tymeshia Awan MD ELECTRONICALLY SIGNED 08/31/2011 7:45

## 2014-10-03 NOTE — H&P (Signed)
PATIENT NAME:  Alex [name], Alex [name] MR#:  [id number] DATE OF BIRTH:  [birth year]  DATE OF ADMISSION:  08/27/2011  REFERRING PHYSICIAN: ER physician, Dr. Mindi Junker PRIMARY CARE PHYSICIAN: Dr. Bethann Punches  CARDIOLOGIST: Dr. Mariah Milling  NEUROLOGIST: Dr. Avie Echevaria GASTROENTEROLOGIST: Dr. Marva Panda  ENT: Dr. Sibyl Parr   CHIEF COMPLAINT: Chest pain.   HISTORY OF PRESENT ILLNESS: Patient is a 81 year old male with past medical history of coronary artery disease status post coronary artery bypass graft, hypertension, hyperlipidemia, mild dementia whose last catheterization was in October 2010 at which time he was found to have a 50% stenosis in the saphenous vein graft to distal RCA. The rest of his grafts were patent. He was advised medical management. Patient reports that he has been doing well, however, about 1 to 2 weeks ago he started developing sharp left-sided chest pain. He says that his coronary artery disease symptoms are atypical and that they get better with exercise. He came to the ER today because he has been having progressively worsening left arm and shoulder heaviness associated with some nausea, lightheadedness and dizziness today. The ER physician discussed with Dr. Mariah Milling who recommended admission to the hospital.   ALLERGIES: Cipro, morphine, contrast dye including MRI contrast dye, Effexor, Aggrenox.   PAST MEDICAL HISTORY:  1. Hepatitis C. 2. Coronary artery disease. 3. Gastroesophageal reflux disease. 4. Degenerative joint disease with spurs in neck and hands; severe degenerative joint disease in the right knee. 5. Idiopathic thrombocytopenia purpura. 6. Hypertension. 7. Coronary artery disease, status post coronary artery bypass graft.  8. History of kidney stones. 9. Hyperlipidemia. 10. Depression. 11. Transient ischemic attack. 12. Mild dementia.   PAST SURGICAL HISTORY:  1. Coronary artery bypass graft in 1996. 2. Right femur surgically repaired in 1981. 3. Bilateral cataract  surgeries.  4. Right knee arthroscopy. 5. EGD 2010 showed hiatal hernia, erosive gastritis, duodenitis.  6. Colonoscopy 2010 showed diverticulosis, internal hemorrhoids. 7. Last catheterization 2010 showed 50% SVG to distal RCA.    FAMILY HISTORY: Mother had Alzheimer's dementia. Father died of motor vehicle accident.    SOCIAL HISTORY: Denies any smoking, alcohol or drug abuse. He is married and lives with his wife.    REVIEW OF SYSTEMS: CONSTITUTIONAL: Denies any fever, weakness. EYES: Denies any blurred or double vision. ENT: Denies any tinnitus, ear pain. RESPIRATORY: Denies any cough, wheezing. CARDIOVASCULAR: Reports chest pain. Denies any palpitations or syncope. GASTROINTESTINAL: Reports nausea. No vomiting, diarrhea, abdominal pain. GENITOURINARY: Denies any dysuria, hematuria. ENDO: Denies any polyuria, nocturia. HEME/LYMPH: Denies any anemia, easy bruisability. INTEGUMENT: Denies any acne, rash. MUSCULOSKELETAL: Denies any swelling, gout. NEUROLOGICAL: Denies any numbness, weakness. PSYCH: Has history of anxiety and depression.   PHYSICAL EXAMINATION:  VITAL SIGNS: Temperature 97, heart rate 53, respiratory rate 20, blood pressure 152/84, pulse ox 100% on room air.   GENERAL: Patient is a 81 year old male laying comfortably in bed, not in acute distress.   HEAD: Atraumatic, normocephalic.   EYES: No pallor, icterus, or cyanosis. Pupils are equal, round, and reactive to light and accommodation. Extraocular movements intact.   ENT: Wet mucous membranes. No oropharyngeal erythema or thrush.   NECK: Supple. No masses. No JVD. No thyromegaly. No lymphadenopathy.   CHEST WALL: No tenderness to palpation. Not using accessory muscles of respiration. No intercostal muscle retractions.   LUNGS: Bilaterally clear to auscultation. No wheezing, rales, or rhonchi.   CARDIOVASCULAR: S1, S2 regular. No murmur, rubs, or gallops.   ABDOMEN: Soft, nontender, nondistended. No guarding. No  rigidity. Normal bowel  sounds.   SKIN: No rashes or lesions.   PERIPHERIES: Trace pedal edema. 1+ pedal pulses.  MUSCULOSKELETAL: No cyanosis or clubbing.   NEUROLOGICAL: Awake, alert, oriented x3. Nonfocal neurological exam. Cranial nerves grossly intact.   PSYCH: Normal mood and affect.   LABORATORY, DIAGNOSTIC, AND RADIOLOGICAL DATA: Cardiac enzymes negative. Glucose 109, AST 63; rest of comprehensive metabolic panel normal. CBC normal.   ASSESSMENT AND PLAN: 81 year old male with past medical history of coronary artery disease, hypertension, hyperlipidemia, gastroesophageal reflux disease presents with left-sided chest pain.  1. Chest pain. Patient will be admitted to telemetry monitor. Will get a echo. Cardiology consultation. Inpatient stress test. Will start patient on aspirin. Continue him on his beta blocker, ARB, statin. Will place him on p.r.n. nitroglycerin and nitro patch.  2. Hypertension. Appears to be stable. Will continue current medications. 3. History of coronary artery disease. Continue aspirin, beta blocker, ARB, statin therapy.  4. History of hyperlipidemia. Continue statin therapy.  5. History of gastroesophageal reflux disease with erosive duodenitis and gastritis seen on endoscopy done in 2010. Will continue PPI.  6. History of hepatitis C with resultant mild elevation in AST. 7. History of idiopathic thrombocytopenia purpura. Patient has followed up with Dr. Orlie DakinFinnegan in the past.  8. History of dementia. Will continue Namenda.  9. History of depression, anxiety. Will continue Zoloft and p.r.n. Xanax.  10. Will place on GI and DVT prophylaxis.   Reviewed old medical records, discussed with the ED physician, discussed with the patient and his wife the plan of care and management.   TIME SPENT: 75 minutes.  ____________________________ Darrick MeigsSangeeta Dalayah Deahl, MD sp:cms D: 08/27/2011 10:58:33 ET T: 08/27/2011 11:32:27 ET JOB#: [id number]  cc: Darrick MeigsSangeeta Ledon Weihe, MD,  <Dictator> Danella PentonMark F. Miller, MD Darrick MeigsSANGEETA Ezrah Panning MD ELECTRONICALLY SIGNED 08/27/2011 14:37

## 2014-10-10 NOTE — Discharge Summary (Signed)
PATIENT NAME:  Alex [name], Alex [name] MR#:  [id number] DATE OF BIRTH:  [birth year]  DATE OF ADMISSION:  08/02/2014 DATE OF DISCHARGE:  08/05/2014  DISCHARGE DIAGNOSES: 1.  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  2.  Coronary artery disease.  3.  Hepatitis C with cirrhosis of the liver.  4.  Neuropathy.  5.  Hypertension.  6.  Hyperlipidemia.  7.  Moderate dementia.   DISCHARGE MEDICATIONS: Avapro 150 mg daily, pantoprazole 40 mg q. a.m., multivitamin daily, Namenda XR 21 mg daily, sertraline 50 mg daily, Xifaxan 550 mg b.i.d., Xanax 0.25 mg t.i.d. p.r.n. anxiety, gabapentin 300 mg 2 tabs at bedtime, galantamine 12 mg b.i.d., gabapentin 300 mg q. a.m., Lipitor 40 mg at bedtime, Plavix 75 mg daily.   REASON FOR ADMISSION: A 81 year old male who presents with left upper extremity weakness, ataxia and left facial droop. Please see [name] and P for HPI, past medical history, and physical exam.   HOSPITAL COURSE: The patient was admitted. Carotid Doppler unremarkable. Brain MRI showed right pontine CVA consistent with his symptoms. He had been on aspirin and then transitioned to Plavix and simvastatin transitioned to Lipitor. Hemodynamically he was stable. His weakness improved in the arm, although he remained with left facial droop and moderately ataxic. He will need skilled nursing placement for PT and OT. Otherwise, he was stable. Overall prognosis is guarded.   ____________________________ Danella PentonMark F. Alahia Whicker, MD mfm:sb D: 08/05/2014 07:31:40 ET T: 08/05/2014 07:48:14 ET JOB#: [id number]  cc: Danella PentonMark F. Cherye Gaertner, MD, <Dictator> Lagena Strand Sherlene ShamsF Velina Drollinger MD ELECTRONICALLY SIGNED 08/05/2014 8:20

## 2014-10-10 NOTE — H&P (Signed)
PATIENT NAME:  Alex [name], Alex [name] MR#:  [id number] DATE OF BIRTH:  1932-12-05  DATE OF ADMISSION:  08/02/2014  PRIMARY CARE PHYSICIAN: Danella Penton, MD  CARDIOLOGIST: Antonieta Iba, MD   CHIEF COMPLAINT: Cannot walk.   HISTORY OF PRESENT ILLNESS: This is a 82 year old man who states that for the last couple days, since Saturday evening, has no energy, cannot walk. He has been off balance. Left-sided weakness, some slurred speech, and disorientation. He has been sleeping a lot. Has to hold onto things while walking. In the ER, as per the ER physician, an MRI of the brain was done and showed a stroke. Hospitalist services were contacted for further evaluation.   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 He was treated for hepatitis C in the past. Frequent falls, gastroesophageal reflux disease, low platelets, neuropathy, hypertension, hyperlipidemia, dementia.   PAST SURGICAL HISTORY: Tibia rod, femur rod, cataracts, right knee arthroscopy, CABG.   ALLERGIES: CIPRO, AGGRENOX, EFFEXOR, MRI CONTRAST, AND MORPHINE.   MEDICATIONS: Include Namenda 21 mg daily, galantamine 12 mg b.i.d., Zocor 20 mg daily, irbesartan 150 mg daily, Protonix 40 mg daily, aspirin 81 mg 2 tablets daily, Zoloft 50 mg daily, gabapentin 300 mg in the a.m., 600 mg in the p.m., Xifaxan 550 mg b.i.d., multivitamin daily, B12 daily, rare use of Xanax, nitroglycerin p.r.n.   SOCIAL HISTORY: No smoking. No alcohol. No drug use. Works in Photographer in the past.   FAMILY HISTORY: Father died of an accident. Mother died in her late 59s, had dementia.  REVIEW OF SYSTEMS: CONSTITUTIONAL: No fever, chills, or sweats. Positive for weakness on the left side.  EYES: Does wear glasses and had problems with his vision. EARS, NOSE, MOUTH, AND THROAT: Decreased hearing. Positive for sore throat the other day and runny nose.  CARDIOVASCULAR: No chest pain. No palpitations.   RESPIRATORY: No shortness of breath. No coughing. No sputum. No hemoptysis.  GASTROINTESTINAL: No nausea. No vomiting. No diarrhea. No constipation. No bright red blood per rectum. No melena.  GENITOURINARY: No burning on urination or hematuria.  MUSCULOSKELETAL: Positive for right knee pain.  INTEGUMENT: No rashes or eruptions.  NEUROLOGIC: No fainting or blackouts, but left-sided weakness, slurred speech.  PSYCHIATRIC: Positive for anxiety.  ENDOCRINE: No thyroid problems. HEMATOLOGIC AND LYMPHATIC: History of low platelets.  PHYSICAL EXAMINATION: VITAL SIGNS: I am unable to get into the computer for the most recent vital signs.  EYES: Conjunctivae and lids normal. Pupils equal, round, and reactive to light. Extraocular muscles intact. No nystagmus. EARS, NOSE, MOUTH, AND THROAT: Tympanic membranes: No erythema. Nasal mucosa: No erythema. Throat: No erythema, no exudate seen. Lips and gums: No lesions.  NECK: No JVD. No bruits. No lymphadenopathy. No thyromegaly. No thyroid nodules palpated.  RESPIRATORY: Lungs clear to auscultation. No use of accessory muscles to breathe. No rhonchi, rales, or wheeze heard.  CARDIOVASCULAR SYSTEM: S1, S2 normal. No gallops, rubs, or murmurs heard. Carotid upstroke 2+ bilaterally. No bruits.  EXTREMITIES: Dorsalis pedis pulses 2+ bilaterally. No edema of the lower extremities.  ABDOMEN: Soft, nontender. No organomegaly/splenomegaly. Normoactive bowel sounds. No masses felt.  SKIN: No rashes or ulcers.  NEUROLOGIC: Cranial nerves II through XII grossly intact except for left facial droop and some slurred speech. Power 5/5 upper and lower extremities, left and right sides. Finger-nose impaired on left side.  PSYCHIATRIC: The patient is alert and oriented to person, place, and time.  LYMPHATIC: No lymph nodes  in the neck or groin.   LABORATORY AND RADIOLOGICAL DATA: I am unable to look into the computer to find any laboratory and radiological data at this  time. As per the ER physician, MRI showed a stroke. EKG, left atrial enlargement, left anterior fascicular block, right bundle branch block, left ventricular hypertrophy.   ASSESSMENT AND PLAN: 1.  Cerebrovascular accident, we will take a step up in treatment and start Plavix. We will increase and get more aggressive with the Lipitor instead of Zocor. We will check a lipid profile in the a.m. We will get occupational therapy, speech therapy, and physical therapy to see the patient depending on his balance issue. We will see if it is safe to go home.  2.  Hypertension. We will continue his usual medications and continue to monitor.  3.  Hyperlipidemia. We will take a step up to high-dose Lipitor and continue to monitor.  4.  Neuropathy. Continue gabapentin.  5.  Depression. Continue Zoloft.  6.  Cirrhosis. The patient was treated for hepatitis C in the past. Continue Xifaxan for a history of hepatic encephalopathy. We will check an ammonia level in the a.m.  7.  History of thrombocytopenia. Continue to monitor.  8.  Dementia, on Namenda and galantamine.  9.  Gastroesophageal reflux disease without esophagitis, on Protonix.  TIME SPENT ON ADMISSION: 55 minutes.  CODE STATUS: THE PATIENT IS A FULL CODE.    ____________________________ Herschell Dimesichard J. Renae GlossWieting, MD rjw:sw D: 08/02/2014 14:32:30 ET T: 08/02/2014 14:43:30 ET JOB#: [id number]  cc: Herschell Dimesichard J. Renae GlossWieting, MD, <Dictator> Danella PentonMark F. Miller, MD Antonieta Ibaimothy J. Gollan, MD Salley ScarletICHARD J Iniko Robles MD ELECTRONICALLY SIGNED 08/03/2014 16:40

## 2014-10-26 ENCOUNTER — Telehealth: Payer: Self-pay

## 2014-10-26 NOTE — Telephone Encounter (Signed)
Reviewed results of 30 day monitor w/ pt's wife:  "NSR w/ no significant arrythmia".  She verbalizes understanding and will call back w/ any further questions or concerns.

## 2014-10-28 ENCOUNTER — Encounter (INDEPENDENT_AMBULATORY_CARE_PROVIDER_SITE_OTHER): Payer: Medicare Other

## 2014-10-28 ENCOUNTER — Other Ambulatory Visit: Payer: Self-pay

## 2014-10-28 DIAGNOSIS — I498 Other specified cardiac arrhythmias: Secondary | ICD-10-CM

## 2014-11-05 ENCOUNTER — Encounter: Payer: Self-pay | Admitting: Cardiovascular Disease

## 2014-11-05 ENCOUNTER — Ambulatory Visit (INDEPENDENT_AMBULATORY_CARE_PROVIDER_SITE_OTHER): Payer: Medicare Other | Admitting: Cardiovascular Disease

## 2014-11-05 VITALS — BP 130/70 | HR 57 | Ht 66.0 in | Wt 167.0 lb

## 2014-11-05 DIAGNOSIS — R002 Palpitations: Secondary | ICD-10-CM | POA: Diagnosis not present

## 2014-11-05 DIAGNOSIS — I25709 Atherosclerosis of coronary artery bypass graft(s), unspecified, with unspecified angina pectoris: Secondary | ICD-10-CM

## 2014-11-05 DIAGNOSIS — E785 Hyperlipidemia, unspecified: Secondary | ICD-10-CM

## 2014-11-05 DIAGNOSIS — I10 Essential (primary) hypertension: Secondary | ICD-10-CM

## 2014-11-05 DIAGNOSIS — I639 Cerebral infarction, unspecified: Secondary | ICD-10-CM | POA: Diagnosis not present

## 2014-11-05 NOTE — Assessment & Plan Note (Signed)
LDL goal around 70 or less. If needed could change to high-dose Crestor

## 2014-11-05 NOTE — Patient Instructions (Signed)
You are doing well. No medication changes were made.  Consider adding the aspirin 81 mg daily with plavix  Please call us if you have new issues that need to be addressed before your next appt.  Your physician wants you to follow-up in: 6 months.  You will receive a reminder letter in the mail two months in advance. If you don't receive a letter, please call our office to schedule the follow-up appointment.

## 2014-11-05 NOTE — Assessment & Plan Note (Signed)
Recent CVA, recovering with rehabilitation over the past 2 months. CVA documented on MRI. Posterior cerebral disease noted On higher dose statin. Recommended he take low-dose aspirin and Plavix
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
## 2014-11-05 NOTE — Assessment & Plan Note (Signed)
Blood pressure is well controlled on today's visit. No changes made to the medications. 

## 2014-11-05 NOTE — Assessment & Plan Note (Signed)
Currently with no symptoms of angina. No further workup at this time. Continue current medication regimen. 

## 2014-11-13 ENCOUNTER — Encounter: Payer: Self-pay | Admitting: Cardiovascular Disease

## 2014-11-17 ENCOUNTER — Other Ambulatory Visit: Payer: Self-pay | Admitting: Internal Medicine

## 2014-11-17 DIAGNOSIS — R1310 Dysphagia, unspecified: Secondary | ICD-10-CM

## 2014-11-30 ENCOUNTER — Ambulatory Visit
Admission: RE | Admit: 2014-11-30 | Discharge: 2014-11-30 | Disposition: A | Discharge status: Home / Self Care | Payer: Medicare Other | Source: Ambulatory Visit | Attending: Internal Medicine | Admitting: Internal Medicine

## 2014-11-30 DIAGNOSIS — R1310 Dysphagia, unspecified: Secondary | ICD-10-CM
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
## 2014-12-06 ENCOUNTER — Other Ambulatory Visit: Payer: Self-pay

## 2015-05-02 ENCOUNTER — Ambulatory Visit (INDEPENDENT_AMBULATORY_CARE_PROVIDER_SITE_OTHER): Payer: Medicare Other | Admitting: Cardiovascular Disease

## 2015-05-02 ENCOUNTER — Encounter: Payer: Self-pay | Admitting: Cardiovascular Disease

## 2015-05-02 VITALS — BP 120/70 | HR 54 | Ht 69.0 in | Wt 166.8 lb

## 2015-05-02 DIAGNOSIS — E785 Hyperlipidemia, unspecified: Secondary | ICD-10-CM | POA: Diagnosis not present

## 2015-05-02 DIAGNOSIS — I25709 Atherosclerosis of coronary artery bypass graft(s), unspecified, with unspecified angina pectoris: Secondary | ICD-10-CM | POA: Diagnosis not present

## 2015-05-02 DIAGNOSIS — Z9181 History of falling: Secondary | ICD-10-CM

## 2015-05-02 DIAGNOSIS — I6310 Cerebral infarction due to embolism of unspecified precerebral artery: Secondary | ICD-10-CM

## 2015-05-02 DIAGNOSIS — I639 Cerebral infarction, unspecified: Secondary | ICD-10-CM | POA: Diagnosis not present

## 2015-05-02 DIAGNOSIS — I10 Essential (primary) hypertension: Secondary | ICD-10-CM | POA: Diagnosis not present

## 2015-05-02 DIAGNOSIS — I6523 Occlusion and stenosis of bilateral carotid arteries: Secondary | ICD-10-CM

## 2015-05-02 MED ORDER — SIMVASTATIN 40 MG PO TABS: 40.0000 mg | ORAL_TABLET | Freq: Every day | ORAL | Status: DC

## 2015-05-02 MED ORDER — IRBESARTAN 300 MG PO TABS: 300.0000 mg | ORAL_TABLET | Freq: Every day | ORAL | Status: DC
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
## 2015-05-02 NOTE — Assessment & Plan Note (Signed)
Blood pressures running low. Recommended he decrease the irbesartan down to 150 mg daily

## 2015-05-02 NOTE — Assessment & Plan Note (Signed)
Long discussion concerning antiplatelet medication  Previously had a stroke on aspirin alone  For now we'll continue aspirin and Plavix  If he continues to have frequent falls, could hold the Plavix

## 2015-05-02 NOTE — Assessment & Plan Note (Signed)
Less than 39% stenosis bilaterally.

## 2015-05-02 NOTE — Assessment & Plan Note (Signed)
Cholesterol is at goal on the current lipid regimen.   goal LDL less than 70 , should achieve this on simvastatin 40

## 2015-05-02 NOTE — Assessment & Plan Note (Signed)
Most of the visit was spent discussing his recent fall  Balance is poor, recommended he do more walking.  Currently does pool aerobics 3 days per week

## 2015-05-02 NOTE — Patient Instructions (Signed)
You are doing well.  Please cut the irbesartan in 1/2 daily (150 mg daily)  Stay on aspirin and plavix for now  Please call us if you have new issues that need to be addressed before your next appt.  Your physician wants you to follow-up in: 6 months.  You will receive a reminder letter in the mail two months in advance. If you don't receive a letter, please call our office to schedule the follow-up appointment.

## 2015-05-02 NOTE — Assessment & Plan Note (Signed)
Currently with no symptoms of angina. No further workup at this time. Continue current medication regimen. 

## 2015-06-01 ENCOUNTER — Telehealth: Payer: Self-pay

## 2015-06-01 ENCOUNTER — Encounter
Admission: RE | Admit: 2015-06-01 | Discharge: 2015-06-01 | Disposition: A | Discharge status: Home / Self Care | Payer: Medicare Other | Source: Ambulatory Visit | Attending: Surgery | Admitting: Surgery

## 2015-06-01 ENCOUNTER — Ambulatory Visit
Admission: RE | Admit: 2015-06-01 | Discharge: 2015-06-01 | Disposition: A | Discharge status: Home / Self Care | Payer: Medicare Other | Source: Ambulatory Visit | Attending: Surgery | Admitting: Surgery

## 2015-06-01 DIAGNOSIS — Z951 Presence of aortocoronary bypass graft: Secondary | ICD-10-CM | POA: Diagnosis not present

## 2015-06-01 DIAGNOSIS — I10 Essential (primary) hypertension: Secondary | ICD-10-CM | POA: Diagnosis present

## 2015-06-01 HISTORY — DX: Unspecified osteoarthritis, unspecified site: M19.90

## 2015-06-01 HISTORY — DX: Cerebral infarction, unspecified: I63.9

## 2015-06-01 HISTORY — DX: Acute myocardial infarction, unspecified: I21.9

## 2015-06-01 LAB — CBC
HEMATOCRIT: 43.4 % (ref 40.0–52.0)
HEMOGLOBIN: 14.4 g/dL (ref 13.0–18.0)
MCH: 30.9 pg (ref 26.0–34.0)
MCHC: 33.2 g/dL (ref 32.0–36.0)
MCV: 92.9 fL (ref 80.0–100.0)
Platelets: 82 10*3/uL — ABNORMAL LOW (ref 150–440)
RBC: 4.67 MIL/uL (ref 4.40–5.90)
RDW: 15.9 % — ABNORMAL HIGH (ref 11.5–14.5)
WBC: 5.6 10*3/uL (ref 3.8–10.6)

## 2015-06-01 LAB — URINALYSIS COMPLETE WITH MICROSCOPIC (ARMC ONLY)
BILIRUBIN URINE: NEGATIVE
Bacteria, UA: NONE SEEN
GLUCOSE, UA: NEGATIVE mg/dL
Hgb urine dipstick: NEGATIVE
KETONES UR: NEGATIVE mg/dL
Leukocytes, UA: NEGATIVE
NITRITE: NEGATIVE
PROTEIN: NEGATIVE mg/dL
RBC / HPF: NONE SEEN RBC/hpf (ref 0–5)
SPECIFIC GRAVITY, URINE: 1.003 — AB (ref 1.005–1.030)
SQUAMOUS EPITHELIAL / LPF: NONE SEEN
WBC UA: NONE SEEN WBC/hpf (ref 0–5)
pH: 7 (ref 5.0–8.0)

## 2015-06-01 LAB — SURGICAL PCR SCREEN
MRSA, PCR: NEGATIVE
Staphylococcus aureus: NEGATIVE

## 2015-06-01 LAB — BASIC METABOLIC PANEL
Anion gap: 6 (ref 5–15)
BUN: 16 mg/dL (ref 6–20)
CHLORIDE: 102 mmol/L (ref 101–111)
CO2: 33 mmol/L — ABNORMAL HIGH (ref 22–32)
CREATININE: 0.99 mg/dL (ref 0.61–1.24)
Calcium: 9.5 mg/dL (ref 8.9–10.3)
GFR calc non Af Amer: >60 mL/min (ref >60)
Glucose, Bld: 56 mg/dL — ABNORMAL LOW (ref 65–99)
POTASSIUM: 3.7 mmol/L (ref 3.5–5.1)
SODIUM: 141 mmol/L (ref 135–145)

## 2015-06-01 LAB — PROTIME-INR
INR: 1.03
PROTHROMBIN TIME: 13.7 s (ref 11.4–15.0)

## 2015-06-01 NOTE — Telephone Encounter (Signed)
Pt is having knee surgery. Pt needs to be cleared for surgery and is on Plavix, and aspirin. Please call pt and advise of instructions.

## 2015-06-01 NOTE — Telephone Encounter (Signed)
Okay to hold Plavix 5 days prior to surgery  Would continue aspirin , would not hold  Otherwise acceptable risk for surgery, no further testing needed

## 2015-06-01 NOTE — Patient Instructions (Signed)
  Your procedure is scheduled on:June 14, 2015 (Tuesday) Report to Day Surgery.[facility]) To find out your arrival time please call [phone] between 1PM - 3PM on June 10, 2015 (Friday).  Remember: Instructions that are not followed completely may result in serious medical risk, up to and including death, or upon the discretion of your surgeon and anesthesiologist your surgery may need to be rescheduled.    _x__ 1. Do not eat food or drink liquids after midnight. No gum chewing or hard candies.     ____ 2. No Alcohol for 24 hours before or after surgery.   ____ 3. Bring all medications with you on the day of surgery if instructed.    __x__ 4. Notify your doctor if there is any change in your medical condition     (cold, fever, infections).     Do not wear jewelry, make-up, hairpins, clips or nail polish.  Do not wear lotions, powders, or perfumes. You may wear deodorant.  Do not shave 48 hours prior to surgery. Men may shave face and neck.  Do not bring valuables to the hospital.    [facility] any belongings or valuables.               Contacts, dentures or bridgework may not be worn into surgery.  Leave your suitcase in the car. After surgery it may be brought to your room.  For patients admitted to the hospital, discharge time is determined by your                treatment team.   Patients discharged the day of surgery will not be allowed to drive home.   Please read over the following fact sheets that you were given:   MRSA Information and Surgical Site Infection Prevention   ____ Take these medicines the morning of surgery with A SIP OF WATER:    1. Gabapentin  2. Irbesartan  3. Protonix (Protonix at bedtime on January 2)     ____ Fleet Enema (as directed)   __x__ Use CHG Soap as directed  ____ Use inhalers on the day of surgery  ____ Stop metformin 2 days prior to surgery    ____ Take 1/2 of usual insulin dose the night before  surgery and none on the morning of surgery.   __x__ Stop Coumadin/Plavix/aspirin on (CHECK WITH [facility] AND FIND OUT WHEN YOU ARE TO STOP PLAVIX AND ASPIRIN)  ____ Stop Anti-inflammatories on    __x__ Stop supplements until after surgery.  (Stop Vitamin B-12 NOW)  ____ Bring C-Pap to the hospital.

## 2015-06-01 NOTE — Telephone Encounter (Signed)
Spoke w/ pt's wife.   Advised her of Dr. Windell HummingbirdGollan's recommendation. She verbalizes understanding and will relay message to pt. Asked her to call back if we can be of further assistance.

## 2015-06-02 LAB — ABO/RH: ABO/RH(D): O NEG

## 2015-06-02 NOTE — Pre-Procedure Instructions (Signed)
As instructed by Dr Charlann BoxerVanstaveren, request for hematology clearance called and faxed to Dr Joice LoftsPoggi office. [facility] for [facility]

## 2015-06-03 LAB — TYPE AND SCREEN
ABO/RH(D): O NEG
ANTIBODY SCREEN: POSITIVE

## 2015-06-07 ENCOUNTER — Inpatient Hospital Stay: Payer: Medicare Other | Attending: Oncology | Admitting: Oncology

## 2015-06-07 VITALS — BP 152/85 | HR 57 | Temp 97.1°F | Resp 16 | Wt 173.1 lb

## 2015-06-07 DIAGNOSIS — M129 Arthropathy, unspecified: Secondary | ICD-10-CM | POA: Insufficient documentation

## 2015-06-07 DIAGNOSIS — Z7982 Long term (current) use of aspirin: Secondary | ICD-10-CM | POA: Insufficient documentation

## 2015-06-07 DIAGNOSIS — Z79899 Other long term (current) drug therapy: Secondary | ICD-10-CM

## 2015-06-07 DIAGNOSIS — Z8781 Personal history of (healed) traumatic fracture: Secondary | ICD-10-CM

## 2015-06-07 DIAGNOSIS — Z8673 Personal history of transient ischemic attack (TIA), and cerebral infarction without residual deficits: Secondary | ICD-10-CM | POA: Diagnosis not present

## 2015-06-07 DIAGNOSIS — I4510 Unspecified right bundle-branch block: Secondary | ICD-10-CM | POA: Diagnosis not present

## 2015-06-07 DIAGNOSIS — Z8619 Personal history of other infectious and parasitic diseases: Secondary | ICD-10-CM | POA: Diagnosis not present

## 2015-06-07 DIAGNOSIS — K7460 Unspecified cirrhosis of liver: Secondary | ICD-10-CM | POA: Diagnosis not present

## 2015-06-07 DIAGNOSIS — M25562 Pain in left knee: Secondary | ICD-10-CM | POA: Insufficient documentation

## 2015-06-07 DIAGNOSIS — I252 Old myocardial infarction: Secondary | ICD-10-CM | POA: Diagnosis not present

## 2015-06-07 DIAGNOSIS — I2510 Atherosclerotic heart disease of native coronary artery without angina pectoris: Secondary | ICD-10-CM | POA: Insufficient documentation

## 2015-06-07 DIAGNOSIS — D696 Thrombocytopenia, unspecified: Secondary | ICD-10-CM | POA: Insufficient documentation

## 2015-06-07 DIAGNOSIS — E785 Hyperlipidemia, unspecified: Secondary | ICD-10-CM | POA: Insufficient documentation

## 2015-06-07 DIAGNOSIS — I10 Essential (primary) hypertension: Secondary | ICD-10-CM | POA: Insufficient documentation

## 2015-06-07 DIAGNOSIS — R413 Other amnesia: Secondary | ICD-10-CM | POA: Insufficient documentation

## 2015-06-07 DIAGNOSIS — K219 Gastro-esophageal reflux disease without esophagitis: Secondary | ICD-10-CM

## 2015-06-07 NOTE — Progress Notes (Signed)
[facility]  Telephone:(336) [phone] Fax:([phone]) [phone]  ID: Alex [name] OB: [birth year]  MR#: [id number]  VHQ#:[id number]  Patient Care Team: Danella Penton, MD as PCP - General (Internal Medicine)  CHIEF COMPLAINT:  Chief Complaint  Patient presents with  . New Evaluation    low platelets    INTERVAL HISTORY: Patient is an 81 year old male who is being referred for risk assessment for thrombocytopenia prior to a partial right knee replacement. Patient was last seen and evaluated greater than 6 years ago and his platelet count [name] remained essentially unchanged. Patient [name] had documented thrombocytopenia for at least 20 years. He currently feels well and is asymptomatic. He [name] no neurologic complaints. He denies any fevers. He denies any easy bleeding or bruising. He [name] a good appetite and denies weight loss. He [name] no chest pain or shortness of breath. He denies any nausea, vomiting, constipation, or diarrhea. He is no urinary complaints. Patient feels at his baseline and offers no specific complaints today.  REVIEW OF SYSTEMS:   Review of Systems  Constitutional: Negative for fever, weight loss and malaise/fatigue.  Respiratory: Negative.   Cardiovascular: Negative.   Gastrointestinal: Negative.   Musculoskeletal: Positive for joint pain.  Neurological: Negative.  Negative for weakness.  Endo/Heme/Allergies: Does not bruise/bleed easily.    As per HPI. Otherwise, a complete review of systems is negatve.  PAST MEDICAL HISTORY: Past Medical History  Diagnosis Date  . CAD (coronary artery disease)     post CABG; myoview 2009 neg  . Hyperlipidemia   . Hypertension   . RBBB (right bundle branch block)   . Hepatitis C     Inactive  . Thrombocytopenia (HCC)   . GERD (gastroesophageal reflux disease)   . Carotid artery disease (HCC)     Mild  . Memory loss   . Idiopathic thrombocytopenia (HCC)   . Cirrhosis of liver (HCC)   . Right tibial fracture  2014  . TIA (transient ischemic attack)   . Myocardial infarction [facility]) 1996      . Stroke [name]) February 2016    [facility]  . Arthritis     PAST SURGICAL HISTORY: Past Surgical History  Procedure Laterality Date  . Coronary artery bypass graft    . Upper gastrointestinal endoscopy    . Leg surgery  2014    right leg  . Tibia fracture surgery Right 2014    Rod in tibia  . Fracture surgery Right 1981    Rod in right femur  . Eye surgery Bilateral     Cataract Extraction    FAMILY HISTORY Family History  Problem Relation Age of Onset  . Diabetes Neg Hx   . Coronary artery disease Neg Hx        ADVANCED DIRECTIVES:    HEALTH MAINTENANCE: Social History  Substance Use Topics  . Smoking status: Never Smoker   . Smokeless tobacco: Never Used  . Alcohol Use: No     Colonoscopy:  PAP:  Bone density:  Lipid panel:  Allergies  Allergen Reactions  . Aspirin-Dipyridamole Er Other (See Comments)    "unknown"  . Ciprofloxacin Other (See Comments)    "burning sensation"  . Morphine Other (See Comments)    "unknown"  . Venlafaxine Other (See Comments)    "unknown"    Current Outpatient Prescriptions  Medication Sig Dispense Refill  . ALPRAZolam (XANAX) 0.5 MG tablet Take 0.5 mg by mouth 3 (three) times daily as needed for anxiety.    Marland Kitchen  aspirin 81 MG tablet Take 81 mg by mouth daily.    . clopidogrel (PLAVIX) 75 MG tablet Take 75 mg by mouth daily.    . Cyanocobalamin (B-12 PO) Take 1,000 mg by mouth daily.     . fluticasone (FLONASE) 50 MCG/ACT nasal spray     . gabapentin (NEURONTIN) 100 MG capsule Take 300 mg by mouth daily. Take 600 mg at bedtime    . galantamine (RAZADYNE) 8 MG tablet Take 12 mg by mouth 2 (two) times daily.     . irbesartan (AVAPRO) 300 MG tablet Take 1 tablet (300 mg total) by mouth daily. 90 tablet 4  . Memantine HCl ER (NAMENDA XR) 21 MG CP24 Take by mouth.    . Multiple Vitamins-Minerals (CENTRUM SILVER) tablet Take 1 tablet by  mouth daily.      . nitroGLYCERIN (NITROSTAT) 0.4 MG SL tablet Place 0.4 mg under the tongue as directed.      . pantoprazole (PROTONIX) 40 MG tablet Take 40 mg by mouth daily.     . rifaximin (XIFAXAN) 550 MG TABS Take 550 mg by mouth 2 (two) times daily.    . sertraline (ZOLOFT) 50 MG tablet     . simvastatin (ZOCOR) 40 MG tablet Take 1 tablet (40 mg total) by mouth at bedtime. 90 tablet 3   No current facility-administered medications for this visit.    OBJECTIVE: Filed Vitals:   06/07/15 1043  BP: 152/85  Pulse: 57  Temp: 97.1 F (36.2 C)  Resp: 16     Body mass index is 27.1 kg/(m^2).    ECOG FS:0 - Asymptomatic  General: Well-developed, well-nourished, no acute distress. Eyes: Pink conjunctiva, anicteric sclera. HEENT: Normocephalic, moist mucous membranes, clear oropharnyx. Lungs: Clear to auscultation bilaterally. Heart: Regular rate and rhythm. No rubs, murmurs, or gallops. Abdomen: Soft, nontender, nondistended. No organomegaly noted, normoactive bowel sounds. Musculoskeletal: No edema, cyanosis, or clubbing. Neuro: Alert, answering all questions appropriately. Cranial nerves grossly intact. Skin: No rashes or petechiae noted. Psych: Normal affect. Lymphatics: No cervical, calvicular, axillary or inguinal LAD.   LAB RESULTS:  Lab Results  Component Value Date   NA 141 06/01/2015   K 3.7 06/01/2015   CL 102 06/01/2015   CO2 33* 06/01/2015   GLUCOSE 56* 06/01/2015   BUN 16 06/01/2015   CREATININE 0.99 06/01/2015   CALCIUM 9.5 06/01/2015   PROT 7.3 08/17/2012   ALBUMIN 4.2 08/17/2012   AST 37 08/17/2012   ALT 38 08/17/2012   ALKPHOS 148* 08/17/2012   BILITOT 0.9 08/17/2012   GFRNONAA >60 06/01/2015   GFRAA >60 06/01/2015    Lab Results  Component Value Date   WBC 5.6 06/01/2015   NEUTROABS 3.6 01/25/2014   HGB 14.4 06/01/2015   HCT 43.4 06/01/2015   MCV 92.9 06/01/2015   PLT 82* 06/01/2015     STUDIES: Dg Chest 2 View  06/01/2015  CLINICAL  DATA:  Hypertension EXAM: CHEST  2 VIEW COMPARISON:  04/24/2013 FINDINGS: CABG changes. Negative for heart failure. Lungs are clear without infiltrate effusion or mass. No change from the prior study. IMPRESSION: No active cardiopulmonary disease. Electronically Signed   By: Marlan Palauharles  Clark M.D.   On: 06/01/2015 16:05    ASSESSMENT:  ITP.  PLAN:    1. ITP: Patient's platelet count is 82 which is essentially his baseline for at least the last 15-20 years. Previous for workup did not reveal an underlying etiology. Patient [name] a history of hepatitis C from blood transfusion in  the early 80s, but he [name] received treatment for this and by report is undetectable. Previous, ultrasound did not reveal splenomegaly. No intervention is needed at this time. Patient is at mild risk of increased bleeding prior to surgery but no treatment is necessary. If his surgeon wishes his platelet count to be greater than 100,000, would consider high-dose prednisone at 1 mg/kg 7 days. Can also try Rituxan or IVIG for more durable remission. No follow-up is necessary. Please refer patient back if his platelet count changes and drops below 40,000. 2. Knee pain: Partial knee replacement by surgery scheduled one week from today. Mild risk for increased bleeding. Patient reports he [name] had CABG as well as a rod placement in his leg in the past with his known thrombocytopenia without any increase in bleeding.  Patient expressed understanding and was in agreement with this plan. He also understands that He can call clinic at any time with any questions, concerns, or complaints.     Jeralyn Ruths, MD   06/07/2015 11:36 AM

## 2015-06-07 NOTE — Progress Notes (Signed)
Patient has history of low platelets and is scheduled for right partial knee replacement on 06/14/15.  His surgeon would like to bee seen at this clinic for surgical clearance.

## 2015-06-08 NOTE — Pre-Procedure Instructions (Signed)
Cleared by Dr Otelia SergeantFinnaghan 06/07/15/ office visit.

## 2015-06-09 ENCOUNTER — Encounter: Payer: Self-pay | Admitting: *Deleted

## 2015-06-14 ENCOUNTER — Inpatient Hospital Stay: Payer: Medicare Other | Admitting: Certified Registered Nurse Anesthetist

## 2015-06-14 ENCOUNTER — Encounter
Admission: RE | Disposition: A | Discharge status: Other Acute Inpatient Hospital | Payer: Self-pay | Source: Ambulatory Visit | Attending: Surgery

## 2015-06-14 ENCOUNTER — Inpatient Hospital Stay: Payer: Medicare Other

## 2015-06-14 ENCOUNTER — Encounter: Payer: Self-pay | Admitting: *Deleted

## 2015-06-14 ENCOUNTER — Inpatient Hospital Stay
Admission: RE | Admit: 2015-06-14 | Discharge: 2015-06-17 | DRG: 470 | Payer: Medicare Other | Source: Ambulatory Visit | Attending: Surgery | Admitting: Surgery

## 2015-06-14 DIAGNOSIS — Z888 Allergy status to other drugs, medicaments and biological substances status: Secondary | ICD-10-CM | POA: Diagnosis not present

## 2015-06-14 DIAGNOSIS — I10 Essential (primary) hypertension: Secondary | ICD-10-CM | POA: Diagnosis present

## 2015-06-14 DIAGNOSIS — I252 Old myocardial infarction: Secondary | ICD-10-CM | POA: Diagnosis not present

## 2015-06-14 DIAGNOSIS — Z885 Allergy status to narcotic agent status: Secondary | ICD-10-CM

## 2015-06-14 DIAGNOSIS — K7460 Unspecified cirrhosis of liver: Secondary | ICD-10-CM | POA: Diagnosis present

## 2015-06-14 DIAGNOSIS — M1711 Unilateral primary osteoarthritis, right knee: Principal | ICD-10-CM | POA: Diagnosis present

## 2015-06-14 DIAGNOSIS — F0390 Unspecified dementia without behavioral disturbance: Secondary | ICD-10-CM | POA: Diagnosis present

## 2015-06-14 DIAGNOSIS — Z96651 Presence of right artificial knee joint: Secondary | ICD-10-CM

## 2015-06-14 DIAGNOSIS — Z882 Allergy status to sulfonamides status: Secondary | ICD-10-CM

## 2015-06-14 DIAGNOSIS — Z951 Presence of aortocoronary bypass graft: Secondary | ICD-10-CM

## 2015-06-14 DIAGNOSIS — K219 Gastro-esophageal reflux disease without esophagitis: Secondary | ICD-10-CM | POA: Diagnosis present

## 2015-06-14 DIAGNOSIS — E785 Hyperlipidemia, unspecified: Secondary | ICD-10-CM | POA: Diagnosis present

## 2015-06-14 DIAGNOSIS — Z79899 Other long term (current) drug therapy: Secondary | ICD-10-CM

## 2015-06-14 DIAGNOSIS — I2510 Atherosclerotic heart disease of native coronary artery without angina pectoris: Secondary | ICD-10-CM | POA: Diagnosis present

## 2015-06-14 DIAGNOSIS — Z7902 Long term (current) use of antithrombotics/antiplatelets: Secondary | ICD-10-CM | POA: Diagnosis not present

## 2015-06-14 DIAGNOSIS — Z7982 Long term (current) use of aspirin: Secondary | ICD-10-CM

## 2015-06-14 DIAGNOSIS — Z8673 Personal history of transient ischemic attack (TIA), and cerebral infarction without residual deficits: Secondary | ICD-10-CM | POA: Diagnosis not present

## 2015-06-14 HISTORY — PX: PARTIAL KNEE ARTHROPLASTY: SHX2174

## 2015-06-14 HISTORY — DX: Unspecified right bundle-branch block: I45.10

## 2015-06-14 HISTORY — DX: Unspecified dementia, unspecified severity, without behavioral disturbance, psychotic disturbance, mood disturbance, and anxiety: F03.90

## 2015-06-14 LAB — GLUCOSE, CAPILLARY: GLUCOSE-CAPILLARY: 117 mg/dL — AB (ref 65–99)

## 2015-06-14 LAB — PLATELET COUNT: Platelets: 79 10*3/uL — ABNORMAL LOW (ref 150–440)

## 2015-06-14 SURGERY — ARTHROPLASTY, KNEE, UNICOMPARTMENTAL
Anesthesia: Spinal | Laterality: Right

## 2015-06-14 MED ORDER — BISACODYL 10 MG RE SUPP
10.0000 mg | Freq: Every day | RECTAL | Status: DC | PRN
  Administered 2015-06-16: 10 mg via RECTAL
  Filled 2015-06-14: qty 1

## 2015-06-14 MED ORDER — FLEET ENEMA 7-19 GM/118ML RE ENEM: 1.0000 | ENEMA | Freq: Once | RECTAL | Status: DC | PRN

## 2015-06-14 MED ORDER — CEFAZOLIN SODIUM-DEXTROSE 2-3 GM-% IV SOLR
2.0000 g | Freq: Once | INTRAVENOUS | Status: AC
  Administered 2015-06-14: 2 g via INTRAVENOUS

## 2015-06-14 MED ORDER — METOCLOPRAMIDE HCL 5 MG PO TABS: 5.0000 mg | ORAL_TABLET | Freq: Three times a day (TID) | ORAL | Status: DC | PRN

## 2015-06-14 MED ORDER — SODIUM CHLORIDE 0.9 % IJ SOLN
INTRAMUSCULAR | Status: AC
  Filled 2015-06-14: qty 50

## 2015-06-14 MED ORDER — IRBESARTAN 150 MG PO TABS
300.0000 mg | ORAL_TABLET | Freq: Every day | ORAL | Status: DC
  Administered 2015-06-15: 150 mg via ORAL
  Administered 2015-06-16 – 2015-06-17 (×2): 300 mg via ORAL
  Filled 2015-06-14 (×3): qty 2

## 2015-06-14 MED ORDER — RIFAXIMIN 550 MG PO TABS
550.0000 mg | ORAL_TABLET | Freq: Two times a day (BID) | ORAL | Status: DC
  Administered 2015-06-14 – 2015-06-17 (×6): 550 mg via ORAL
  Filled 2015-06-14 (×7): qty 1

## 2015-06-14 MED ORDER — ONDANSETRON HCL 4 MG/2ML IJ SOLN
4.0000 mg | Freq: Once | INTRAMUSCULAR | Status: DC | PRN
Start: 2015-06-14 — End: 2015-06-17

## 2015-06-14 MED ORDER — SIMVASTATIN 40 MG PO TABS
40.0000 mg | ORAL_TABLET | Freq: Every day | ORAL | Status: DC
  Administered 2015-06-14 – 2015-06-16 (×3): 40 mg via ORAL
  Filled 2015-06-14 (×4): qty 1

## 2015-06-14 MED ORDER — BUPIVACAINE LIPOSOME 1.3 % IJ SUSP
INTRAMUSCULAR | Status: AC
  Filled 2015-06-14: qty 20

## 2015-06-14 MED ORDER — ACETAMINOPHEN 325 MG PO TABS
650.0000 mg | ORAL_TABLET | Freq: Four times a day (QID) | ORAL | Status: DC | PRN
  Administered 2015-06-15: 650 mg via ORAL
  Filled 2015-06-14: qty 2

## 2015-06-14 MED ORDER — GABAPENTIN 300 MG PO CAPS
300.0000 mg | ORAL_CAPSULE | Freq: Every day | ORAL | Status: DC
  Administered 2015-06-15 – 2015-06-17 (×3): 300 mg via ORAL
  Filled 2015-06-14 (×4): qty 1

## 2015-06-14 MED ORDER — SERTRALINE HCL 50 MG PO TABS
50.0000 mg | ORAL_TABLET | Freq: Every day | ORAL | Status: DC
  Administered 2015-06-14 – 2015-06-17 (×4): 50 mg via ORAL
  Filled 2015-06-14 (×6): qty 1

## 2015-06-14 MED ORDER — MIDAZOLAM HCL 5 MG/5ML IJ SOLN
INTRAMUSCULAR | Status: DC | PRN
  Administered 2015-06-14: 1 mg via INTRAVENOUS

## 2015-06-14 MED ORDER — FLUTICASONE PROPIONATE 50 MCG/ACT NA SUSP
1.0000 | Freq: Every day | NASAL | Status: DC
  Filled 2015-06-14: qty 16

## 2015-06-14 MED ORDER — ASPIRIN EC 81 MG PO TBEC
81.0000 mg | DELAYED_RELEASE_TABLET | Freq: Every day | ORAL | Status: DC
Start: 2015-06-14 — End: 2015-06-17
  Administered 2015-06-14 – 2015-06-17 (×4): 81 mg via ORAL
  Filled 2015-06-14 (×5): qty 1

## 2015-06-14 MED ORDER — METOCLOPRAMIDE HCL 5 MG/ML IJ SOLN: 5.0000 mg | Freq: Three times a day (TID) | INTRAMUSCULAR | Status: DC | PRN

## 2015-06-14 MED ORDER — TRANEXAMIC ACID 1000 MG/10ML IV SOLN
INTRAVENOUS | Status: AC
  Filled 2015-06-14: qty 10

## 2015-06-14 MED ORDER — NITROGLYCERIN 0.4 MG SL SUBL: 0.4000 mg | SUBLINGUAL_TABLET | SUBLINGUAL | Status: DC

## 2015-06-14 MED ORDER — MEMANTINE HCL ER 7 MG PO CP24
21.0000 mg | ORAL_CAPSULE | Freq: Every day | ORAL | Status: DC
  Administered 2015-06-14 – 2015-06-17 (×4): 21 mg via ORAL
  Filled 2015-06-14 (×5): qty 3

## 2015-06-14 MED ORDER — DOCUSATE SODIUM 100 MG PO CAPS
100.0000 mg | ORAL_CAPSULE | Freq: Two times a day (BID) | ORAL | Status: DC
  Administered 2015-06-14 – 2015-06-17 (×7): 100 mg via ORAL
  Filled 2015-06-14 (×7): qty 1

## 2015-06-14 MED ORDER — PANTOPRAZOLE SODIUM 40 MG PO TBEC
40.0000 mg | DELAYED_RELEASE_TABLET | Freq: Every day | ORAL | Status: DC
  Administered 2015-06-15 – 2015-06-16 (×3): 40 mg via ORAL
  Filled 2015-06-14 (×4): qty 1

## 2015-06-14 MED ORDER — KCL IN DEXTROSE-NACL 20-5-0.9 MEQ/L-%-% IV SOLN
INTRAVENOUS | Status: DC
  Administered 2015-06-14 – 2015-06-16 (×4): via INTRAVENOUS
  Filled 2015-06-14 (×8): qty 1000

## 2015-06-14 MED ORDER — BUPIVACAINE HCL (PF) 0.5 % IJ SOLN
INTRAMUSCULAR | Status: DC | PRN
  Administered 2015-06-14: 3 mL

## 2015-06-14 MED ORDER — NEOMYCIN-POLYMYXIN B GU 40-200000 IR SOLN
Status: AC
  Filled 2015-06-14: qty 20

## 2015-06-14 MED ORDER — SODIUM CHLORIDE 0.9 % IV SOLN
INTRAVENOUS | Status: DC | PRN
  Administered 2015-06-14: 60 mL

## 2015-06-14 MED ORDER — ACETAMINOPHEN 10 MG/ML IV SOLN
INTRAVENOUS | Status: DC | PRN
  Administered 2015-06-14: 1000 mg via INTRAVENOUS

## 2015-06-14 MED ORDER — CEFAZOLIN SODIUM-DEXTROSE 2-3 GM-% IV SOLR
INTRAVENOUS | Status: AC
  Filled 2015-06-14: qty 50

## 2015-06-14 MED ORDER — B-12 1000 MCG PO TBCR
1000.0000 mg | EXTENDED_RELEASE_TABLET | Freq: Every day | ORAL | Status: DC
Start: 2015-06-14 — End: 2015-06-14

## 2015-06-14 MED ORDER — CLOPIDOGREL BISULFATE 75 MG PO TABS
75.0000 mg | ORAL_TABLET | Freq: Every day | ORAL | Status: DC
  Administered 2015-06-14 – 2015-06-17 (×4): 75 mg via ORAL
  Filled 2015-06-14 (×5): qty 1

## 2015-06-14 MED ORDER — BUPIVACAINE-EPINEPHRINE (PF) 0.5% -1:200000 IJ SOLN
INTRAMUSCULAR | Status: AC
  Filled 2015-06-14: qty 30

## 2015-06-14 MED ORDER — ENOXAPARIN SODIUM 30 MG/0.3ML ~~LOC~~ SOLN
30.0000 mg | SUBCUTANEOUS | Status: DC
  Administered 2015-06-15 – 2015-06-17 (×3): 30 mg via SUBCUTANEOUS
  Filled 2015-06-14 (×4): qty 0.3

## 2015-06-14 MED ORDER — ONDANSETRON HCL 4 MG/2ML IJ SOLN: 4.0000 mg | Freq: Four times a day (QID) | INTRAMUSCULAR | Status: DC | PRN

## 2015-06-14 MED ORDER — PROPOFOL 500 MG/50ML IV EMUL
INTRAVENOUS | Status: DC | PRN
  Administered 2015-06-14: 75 ug/kg/min via INTRAVENOUS

## 2015-06-14 MED ORDER — HYDROMORPHONE HCL 1 MG/ML IJ SOLN: 0.5000 mg | INTRAMUSCULAR | Status: DC | PRN

## 2015-06-14 MED ORDER — TRANEXAMIC ACID 1000 MG/10ML IV SOLN
1000.0000 mg | INTRAVENOUS | Status: DC | PRN
  Administered 2015-06-14: 1000 mg via TOPICAL

## 2015-06-14 MED ORDER — EPHEDRINE SULFATE 50 MG/ML IJ SOLN
INTRAMUSCULAR | Status: DC | PRN
  Administered 2015-06-14 (×2): 5 mg via INTRAVENOUS

## 2015-06-14 MED ORDER — DIPHENHYDRAMINE HCL 12.5 MG/5ML PO ELIX: 12.5000 mg | ORAL_SOLUTION | ORAL | Status: DC | PRN

## 2015-06-14 MED ORDER — FENTANYL CITRATE (PF) 100 MCG/2ML IJ SOLN
INTRAMUSCULAR | Status: DC | PRN
  Administered 2015-06-14: 25 ug via INTRAVENOUS

## 2015-06-14 MED ORDER — GALANTAMINE HYDROBROMIDE 4 MG PO TABS
12.0000 mg | ORAL_TABLET | Freq: Two times a day (BID) | ORAL | Status: DC
  Administered 2015-06-14 – 2015-06-17 (×7): 12 mg via ORAL
  Filled 2015-06-14 (×7): qty 3

## 2015-06-14 MED ORDER — NEOMYCIN-POLYMYXIN B GU 40-200000 IR SOLN
Status: DC | PRN
  Administered 2015-06-14: 16 mL

## 2015-06-14 MED ORDER — FENTANYL CITRATE (PF) 100 MCG/2ML IJ SOLN: 25.0000 ug | INTRAMUSCULAR | Status: DC | PRN

## 2015-06-14 MED ORDER — ACETAMINOPHEN 10 MG/ML IV SOLN
INTRAVENOUS | Status: AC
  Filled 2015-06-14: qty 100

## 2015-06-14 MED ORDER — MAGNESIUM HYDROXIDE 400 MG/5ML PO SUSP: 30.0000 mL | Freq: Every day | ORAL | Status: DC | PRN

## 2015-06-14 MED ORDER — ADULT MULTIVITAMIN W/MINERALS CH
1.0000 | ORAL_TABLET | Freq: Every day | ORAL | Status: DC
  Administered 2015-06-14 – 2015-06-17 (×4): 1 via ORAL
  Filled 2015-06-14 (×3): qty 1

## 2015-06-14 MED ORDER — ACETAMINOPHEN 650 MG RE SUPP: 650.0000 mg | Freq: Four times a day (QID) | RECTAL | Status: DC | PRN

## 2015-06-14 MED ORDER — GABAPENTIN 600 MG PO TABS
600.0000 mg | ORAL_TABLET | Freq: Every day | ORAL | Status: DC
  Administered 2015-06-14 – 2015-06-16 (×3): 600 mg via ORAL
  Filled 2015-06-14 (×3): qty 1

## 2015-06-14 MED ORDER — VITAMIN B-12 1000 MCG PO TABS
1000.0000 ug | ORAL_TABLET | Freq: Every day | ORAL | Status: DC
  Administered 2015-06-14 – 2015-06-17 (×4): 1000 ug via ORAL
  Filled 2015-06-14 (×4): qty 1

## 2015-06-14 MED ORDER — CEFAZOLIN SODIUM-DEXTROSE 2-3 GM-% IV SOLR
2.0000 g | Freq: Four times a day (QID) | INTRAVENOUS | Status: AC
  Administered 2015-06-14 (×2): 2 g via INTRAVENOUS
  Filled 2015-06-14 (×3): qty 50

## 2015-06-14 MED ORDER — BUPIVACAINE-EPINEPHRINE (PF) 0.5% -1:200000 IJ SOLN
INTRAMUSCULAR | Status: DC | PRN
  Administered 2015-06-14: 30 mL via PERINEURAL

## 2015-06-14 MED ORDER — ALPRAZOLAM 0.5 MG PO TABS
0.5000 mg | ORAL_TABLET | Freq: Three times a day (TID) | ORAL | Status: DC | PRN
  Administered 2015-06-15: 0.5 mg via ORAL
  Filled 2015-06-14: qty 1

## 2015-06-14 MED ORDER — ONDANSETRON HCL 4 MG PO TABS: 4.0000 mg | ORAL_TABLET | Freq: Four times a day (QID) | ORAL | Status: DC | PRN

## 2015-06-14 MED ORDER — OXYCODONE HCL 5 MG PO TABS
5.0000 mg | ORAL_TABLET | ORAL | Status: DC | PRN
  Administered 2015-06-14 – 2015-06-17 (×5): 5 mg via ORAL
  Filled 2015-06-14: qty 1
  Filled 2015-06-14: qty 2
  Filled 2015-06-14 (×3): qty 1

## 2015-06-14 MED ORDER — LACTATED RINGERS IV SOLN
INTRAVENOUS | Status: DC
  Administered 2015-06-14: 07:00:00 via INTRAVENOUS

## 2015-06-14 MED ORDER — ACETAMINOPHEN 500 MG PO TABS
1000.0000 mg | ORAL_TABLET | Freq: Four times a day (QID) | ORAL | Status: AC
  Administered 2015-06-14 – 2015-06-15 (×4): 1000 mg via ORAL
  Filled 2015-06-14 (×4): qty 2

## 2015-06-14 SURGICAL SUPPLY — 78 items
BANDAGE ACE 6X5 VEL STRL LF (GAUZE/BANDAGES/DRESSINGS) ×3 IMPLANT
BLADE SURG SZ10 CARB STEEL (BLADE) ×12 IMPLANT
BNDG COHESIVE 4X5 TAN STRL (GAUZE/BANDAGES/DRESSINGS) ×3 IMPLANT
BNDG COHESIVE 6X5 TAN STRL LF (GAUZE/BANDAGES/DRESSINGS) ×3 IMPLANT
BNDG ESMARK 6X12 TAN STRL LF (GAUZE/BANDAGES/DRESSINGS) ×3 IMPLANT
BONE CEMENT PALACOSE (Orthopedic Implant) ×3 IMPLANT
BOWL CEMENT MIX W SPATULA BONE (MISCELLANEOUS) ×1 IMPLANT
CANISTER SUCT 1200ML W/VALVE (MISCELLANEOUS) ×3 IMPLANT
CANISTER SUCT 3000ML (MISCELLANEOUS) ×3 IMPLANT
CAPT KNEE PARTIAL 2 ×2 IMPLANT
CATH FOL LEG HOLDER (MISCELLANEOUS) ×3 IMPLANT
CATH TRAY METER 16FR LF (MISCELLANEOUS) ×3 IMPLANT
CEMENT BONE PALACOSE (Orthopedic Implant) IMPLANT
CHLORAPREP W/TINT 26ML (MISCELLANEOUS) ×6 IMPLANT
CNTNR SPEC C3OZ STD GRAD LEK (MISCELLANEOUS) ×1 IMPLANT
COMPACT MIXING SYSTEM IMPLANT
CONT SPEC 3OZ W/LID STRL (MISCELLANEOUS)
COOLER POLAR GLACIER W/PUMP (MISCELLANEOUS) ×3 IMPLANT
COVER MAYO STAND STRL (DRAPES) ×3 IMPLANT
DRAPE C-ARM XRAY 36X54 (DRAPES) ×3 IMPLANT
DRAPE INCISE IOBAN 66X45 STRL (DRAPES) ×3 IMPLANT
DRAPE POUCH INSTRU U-SHP 10X18 (DRAPES) ×3 IMPLANT
DRAPE TABLE BACK 80X90 (DRAPES) ×3 IMPLANT
DRSG OPSITE POSTOP 4X12 (GAUZE/BANDAGES/DRESSINGS) ×3 IMPLANT
DRSG OPSITE POSTOP 4X14 (GAUZE/BANDAGES/DRESSINGS) ×1 IMPLANT
DRSG OPSITE POSTOP 4X6 (GAUZE/BANDAGES/DRESSINGS) ×3 IMPLANT
ELECT CAUTERY BLADE 6.4 (BLADE) ×3 IMPLANT
GAUZE PETRO XEROFOAM 1X8 (MISCELLANEOUS) ×1 IMPLANT
GAUZE SPONGE 4X4 12PLY STRL (GAUZE/BANDAGES/DRESSINGS) ×1 IMPLANT
GLOVE BIO SURGEON STRL SZ7.5 (GLOVE) ×6 IMPLANT
GLOVE BIO SURGEON STRL SZ8 (GLOVE) ×6 IMPLANT
GLOVE BIOGEL PI IND STRL 8 (GLOVE) ×1 IMPLANT
GLOVE BIOGEL PI INDICATOR 8 (GLOVE) ×2
GLOVE INDICATOR 8.0 STRL GRN (GLOVE) ×3 IMPLANT
GOWN STRL REUS W/ TWL LRG LVL3 (GOWN DISPOSABLE) ×2 IMPLANT
GOWN STRL REUS W/ TWL XL LVL3 (GOWN DISPOSABLE) ×1 IMPLANT
GOWN STRL REUS W/TWL LRG LVL3 (GOWN DISPOSABLE) ×6
GOWN STRL REUS W/TWL XL LVL3 (GOWN DISPOSABLE) ×3
GRADUATE 1200CC STRL 31836 (MISCELLANEOUS) ×3 IMPLANT
HANDLE YANKAUER SUCT BULB TIP (MISCELLANEOUS) ×3 IMPLANT
HANDPIECE SUCTION TUBG SURGILV (MISCELLANEOUS) ×3 IMPLANT
HOOD PEEL AWAY FACE SHEILD DIS (HOOD) ×9 IMPLANT
MAT BLUE FLOOR 46X72 FLO (MISCELLANEOUS) ×1 IMPLANT
NDL SAFETY 18GX1.5 (NEEDLE) ×3 IMPLANT
NDL SPNL 18GX3.5 QUINCKE PK (NEEDLE) ×1 IMPLANT
NDL SPNL 20GX3.5 QUINCKE YW (NEEDLE) ×1 IMPLANT
NEEDLE SPNL 18GX3.5 QUINCKE PK (NEEDLE) ×3 IMPLANT
NEEDLE SPNL 20GX3.5 QUINCKE YW (NEEDLE) ×3 IMPLANT
NS IRRIG 1000ML POUR BTL (IV SOLUTION) ×3 IMPLANT
PACK ARTHROSCOPY KNEE (MISCELLANEOUS) ×3 IMPLANT
PACK BLADE SAW RECIP 70 3 PT (BLADE) ×2 IMPLANT
PAD GROUND ADULT SPLIT (MISCELLANEOUS) ×3 IMPLANT
PAD WRAPON POLAR KNEE (MISCELLANEOUS) ×1 IMPLANT
PADDING CAST 4IN STRL (MISCELLANEOUS)
PADDING CAST BLEND 4X4 STRL (MISCELLANEOUS) ×2 IMPLANT
PENCIL ELECTRO HAND CTR (MISCELLANEOUS) ×3 IMPLANT
SOL .9 NS 3000ML IRR  AL (IV SOLUTION) ×2
SOL .9 NS 3000ML IRR AL (IV SOLUTION) ×1
SOL .9 NS 3000ML IRR UROMATIC (IV SOLUTION) ×1 IMPLANT
SPONGE LAP 18X18 5 PK (GAUZE/BANDAGES/DRESSINGS) ×1 IMPLANT
SPONGE XRAY 4X4 16PLY STRL (MISCELLANEOUS) ×3 IMPLANT
STAPLER SKIN PROX 35W (STAPLE) ×3 IMPLANT
STRAP SAFETY BODY (MISCELLANEOUS) ×3 IMPLANT
SUCTION FRAZIER TIP 10 FR DISP (SUCTIONS) ×3 IMPLANT
SUT VIC AB 0 CT1 36 (SUTURE) ×12 IMPLANT
SUT VIC AB 2-0 CT1 27 (SUTURE) ×12
SUT VIC AB 2-0 CT1 TAPERPNT 27 (SUTURE) ×4 IMPLANT
SUT VIC AB 2-0 CT2 27 (SUTURE) ×6 IMPLANT
SYR 20CC LL (SYRINGE) ×3 IMPLANT
SYR 30ML LL (SYRINGE) ×6 IMPLANT
SYR BULB IRRIG 60ML STRL (SYRINGE) ×3 IMPLANT
SYRINGE 10CC LL (SYRINGE) ×3 IMPLANT
SYSTEM VACUUM CEMENT MIXING ×2 IMPLANT
TAPE TRANSPORE STRL 2 31045 (GAUZE/BANDAGES/DRESSINGS) ×3 IMPLANT
TUBING CONNECTING 10 (TUBING) ×2 IMPLANT
TUBING CONNECTING 10' (TUBING) ×1
WATER STERILE IRR 1000ML POUR (IV SOLUTION) ×1 IMPLANT
WRAPON POLAR PAD KNEE (MISCELLANEOUS) ×3

## 2015-06-14 NOTE — Op Note (Signed)
06/14/2015  10:45 AM  Patient:   Alex [name]  Pre-Op Diagnosis:   Degenerative joint disease of medial compartment, right knee.  Post-Op Diagnosis:   Same  Procedure:   Right unicondylar knee arthroplasty.  Surgeon:   Maryagnes Amos, MD  Assistant:   Horris Latino, PA-C  Anesthesia:   Spinal  Findings:   As above.  Complications:   None  EBL:   5 cc  Fluids:   900 cc crystalloid  UOP:   300 cc  TT:   91 minutes at 300 mmHg  Drains:   None  Closure:   Staples  Implants:   All-cemented Biomet Oxford system with a Small-sized femoral component, a "D" sized tibial tray, and a 6 mm meniscal bearing insert.  Brief Clinical Note:   The patient is an 82 year old male with a long history of gradually worsening medial sided right knee pain. The symptoms have progressed despite medications, activity modification, injections, etc. His history and examination are consistent with degenerative joint disease of the right knee, primarily involving the medial compartment. The patient presents at this time for a right partial knee replacement.  Procedure:   The patient was brought into the operating room and a spinal placed by the anesthesiologist. The patient was lain in the supine position and a Foley catheter inserted. The patient was repositioned so that the non-surgical leg was placed in a flexed and abducted position in the yellow fin leg holder while the surgical site was placed over the Biomet leg holder. The right lower extremity was prepped with ChloraPrep solution before being draped sterilely. Preoperative antibiotics were administered. After performing a timeout to verify the appropriate surgical site, the limb was exsanguinated with an Esmarch and the tourniquet inflated to 300 mmHg. A standard anterior approach to the knee was made through an approximately 3.5-4 inch incision, incorporating the prior incision. The incision was carried down through the subcutaneous tissues to expose  the superficial retinaculum. This was split the length the incision and medial flap elevated sufficiently to expose the medial retinaculum. This was incised along the medial border of the patella tendon and extended proximally along the medial border of the patella, leaving a 3-4 mm cuff of tissue attached to the patella. The soft tissues were elevated off the anteromedial aspect of the proximal tibia. The anterior portion of the meniscus was removed after performing a subtotal excision of the infrapatellar fat pad. The anterior cruciate ligament was inspected and found to be in excellent condition. Osteophytes were removed from the notch using a quarter-inch osteotome. There were significant degenerative changes of both the femur and tibia on the medial side. The medial femoral condyle was sized using the small and medium sizers. It was felt that the small guide best optimized the contour of the femur. This was left in place and the external tibial guide positioned. The coupling device was used to connect the guide to the medial femoral condylar sizer to optimize appropriate orientation. Two guide pins were inserted into the cutting block before the coupling device and sizer were removed. The appropriate tibial cut was made using the oscillating and reciprocating saws. The piece was removed in its entirety and taken to the back table where it was sized and found to be optimally replicated by a "D" sized component. The 9 mm spacer was inserted to verify that sufficient bone had been removed.  Attention was directed to femoral side. A line was drawn vertically over the midportion of the  medial femoral condyle. The guide for the femoral condylar holes was aligned over this line before the two drill holes were created in the distal aspect of the medial femoral condyle. The devices were removed and the posterior condylar cutting block inserted. The appropriate cut was made using the reciprocating saw and this piece  removed. The #0 spigot was inserted and the initial bone milling performed. A trial femoral component was inserted and both the flexion and extension gaps measured. In flexion, the gap measured 8 mm whereas in extension, it measured 6 mm. Therefore, the #2 spigot was selected and the secondary bone milling performed. Repeat sizing demonstrated symmetric flexion and extension gaps. The bone was removed from the postero-medial and postero-lateral aspects of the femoral condyle, as well as from the beneath the collar of the spigot. Bone also was removed from the anterior portion of the femur so as to minimize any potential impingement with the meniscal bearing insert. The trial components removed and several drill holes placed into the distal femoral condyle to further augment cement fixation.  Attention was redirected to the tibial side. The "D" sized tibial tray was positioned and temporarily secured using the appropriate spiked nail. The keel was created using the bi-bladed reciprocating saw and hoe. The keeled "D" sized trial tibial tray was inserted to be sure that it seated properly. At this point, a total of 20 cc of Exparel diluted out to 60 cc with normal saline and 30 cc of 0.5% Sensorcaine was injected in and around the posterior and medial capsular tissues, as well as the peri-incisional tissues to help with postoperative pain control.  The bony surfaces were prepared for cementing by irrigating them thoroughly with bacitracin saline solution using the jet lavage system before packing them with a dry Ray-Tec sponge. Meanwhile, cement was being mixed on the back table. When the cement was ready, the tibial tray was cemented in first. The excess cement was removed using a Public house managerWoodson elevator after impacting it into place. Next, the femoral component was impacted into place. Again the excess cement was removed using a Public house managerWoodson elevator. The 6 mm spacer was inserted and the knee brought into near full extension  while the cement hardened. Once the cement hardened, the spacer was removed and the 6 mm meniscal bearing insert was trialed. This demonstrated excellent tracking while the knee was placed through a range of motion, and showed no evidence towards subluxation or dislocation. Therefore, the permanent 6 mm meniscal bearing insert was snapped into position after verifying that no cement in the retained posteriorly. Again the knee was placed through a range of motion with the findings as described above.  The wound was copiously irrigated with bacitracin saline solution via the jet lavage system before the retinacular layer was reapproximated using #0 Vicryl interrupted sutures. At this point, 1 g of transexemic acid in 10 cc of normal saline was injected intra-articularly. The subcutaneous tissues were closed using 2-0 Vicryl interrupted sutures before the skin was closed using staples. A sterile occlusive dressing was applied to the knee before the patient was awakened. The patient was transferred back to his hospital bed and returned to the recovery room in satisfactory condition after tolerating the procedure well. A Polar Care device was applied to the knee as well.

## 2015-06-14 NOTE — Anesthesia Preprocedure Evaluation (Addendum)
Anesthesia Evaluation  Patient identified by MRN, date of birth, ID band Patient awake    Reviewed: Allergy & Precautions, H&P , NPO status , Patient's Chart, lab work & pertinent test results, reviewed documented beta blocker date and time   Airway Mallampati: II  TM Distance: >3 FB Neck ROM: full    Dental no notable dental hx. (+) Teeth Intact   Pulmonary neg pulmonary ROS,    Pulmonary exam normal breath sounds clear to auscultation       Cardiovascular Exercise Tolerance: Good hypertension, + CAD, + Past MI and + Peripheral Vascular Disease  negative cardio ROS  + dysrhythmias  Rhythm:regular Rate:Normal     Neuro/Psych Generalized weakness and memory loss at baseline following stroke TIACVA negative neurological ROS  negative psych ROS   GI/Hepatic negative GI ROS, Neg liver ROS, GERD  ,(+) Hepatitis -  Endo/Other  negative endocrine ROS  Renal/GU negative Renal ROS  negative genitourinary   Musculoskeletal  (+) Arthritis ,   Abdominal   Peds  Hematology ITP with baseline plt approx 80s   Anesthesia Other Findings   Reproductive/Obstetrics negative OB ROS                            Anesthesia Physical Anesthesia Plan  ASA: III  Anesthesia Plan: Spinal   Post-op Pain Management:    Induction:   Airway Management Planned:   Additional Equipment:   Intra-op Plan:   Post-operative Plan:   Informed Consent: I have reviewed the patients History and Physical, chart, labs and discussed the procedure including the risks, benefits and alternatives for the proposed anesthesia with the patient or authorized representative who has indicated his/her understanding and acceptance.   Dental Advisory Given  Plan Discussed with: CRNA  Anesthesia Plan Comments:         Anesthesia Quick Evaluation

## 2015-06-14 NOTE — Transfer of Care (Signed)
Immediate Anesthesia Transfer of Care Note  Patient: Alex [name]  Procedure(s) Performed: Procedure(s): UNICOMPARTMENTAL KNEE (Right)  Patient Location: PACU  Anesthesia Type:Spinal  Level of Consciousness: awake and alert   Airway & Oxygen Therapy: Patient Spontanous Breathing  Post-op Assessment: Report given to RN and Post -op Vital signs reviewed and stable  Post vital signs: Reviewed and stable  Last Vitals:  Filed Vitals:   06/14/15 0637 06/14/15 1031  BP: 157/77 135/73  Pulse: 55 57  Temp: 36.4 C 36.6 C  Resp: 16 14    Complications: No apparent anesthesia complications

## 2015-06-14 NOTE — Evaluation (Signed)
Physical Therapy Evaluation Patient Details Name: Alex [name] MRN: [id number] DOB: 1932-09-04 Today's Date: 06/14/2015   History of Present Illness  Pt admitted for R partial knee replacement sx on 06/14/15. Pt evaluated on POD 0. Pt with history of CVA and cognitive impairments.  Clinical Impression  Pt is a pleasant 82 year old male who was admitted for R partial knee replacement. Pt performs bed mobility, transfers, and ambulation with cga and rw. Pt demonstrates deficits with strength/mobility/pain. Would benefit from skilled PT to address above deficits and promote optimal return to PLOF. Pt needs short sequencing commands and is slightly impulsive. Will need hands on approach for mobility using rw at all times.       Follow Up Recommendations Home health PT;Supervision for mobility/OOB    Equipment Recommendations  None recommended by PT    Recommendations for Other Services       Precautions / Restrictions Precautions Precautions: Fall;Knee Precaution Booklet Issued: No Restrictions Weight Bearing Restrictions: Yes RLE Weight Bearing: Weight bearing as tolerated      Mobility  Bed Mobility Overal bed mobility: Needs Assistance Bed Mobility: Supine to Sit     Supine to sit: Min guard     General bed mobility comments: safe technique performed, although slightly impulsive, needs cues to wait for therapist prior to initation of activity  Transfers Overall transfer level: Needs assistance Equipment used: Rolling walker (2 wheeled) Transfers: Sit to/from Stand Sit to Stand: Min guard         General transfer comment: transfers performed with safe technique. Pt with no LOB, however heavy guarding of R LE. Once standing, therapist noticed blood draining from incision. RN notified and assisted in reinforcment.  Ambulation/Gait Ambulation/Gait assistance: Min guard Ambulation Distance (Feet): 3 Feet Assistive device: Rolling walker (2 wheeled) Gait  Pattern/deviations: Step-to pattern     General Gait Details: ambulated using rw and safe technique. Short step to gait pattern performed. Pt complains of leg length discreptency between L vs R LE that affects ambulation  Stairs            Wheelchair Mobility    Modified Rankin (Stroke Patients Only)       Balance Overall balance assessment: Needs assistance Sitting-balance support: Bilateral upper extremity supported Sitting balance-Leahy Scale: Good     Standing balance support: Bilateral upper extremity supported Standing balance-Leahy Scale: Good                               Pertinent Vitals/Pain Pain Assessment: 0-10 Pain Score: 1  Pain Location: R knee Pain Descriptors / Indicators: Operative site guarding Pain Intervention(s): Limited activity within patient's tolerance;Premedicated before session;Ice applied    Home Living Family/patient expects to be discharged to:: Private residence Living Arrangements: Spouse/significant other Available Help at Discharge: Available 24 hours/day Type of Home: House Home Access: Stairs to enter Entrance Stairs-Rails: Can reach both Entrance Stairs-Number of Steps: 4 Home Layout: One level Home Equipment: Walker - 2 wheels      Prior Function Level of Independence: Independent               Hand Dominance        Extremity/Trunk Assessment   Upper Extremity Assessment: Overall WFL for tasks assessed           Lower Extremity Assessment: RLE deficits/detail RLE Deficits / Details: Pt grossly 3/5, evident by ability to perform SLR without assistance, no need for KI  at this time       Communication   Communication: No difficulties  Cognition Arousal/Alertness: Awake/alert Behavior During Therapy: WFL for tasks assessed/performed Overall Cognitive Status: Within Functional Limits for tasks assessed                      General Comments      Exercises Total Joint  Exercises Goniometric ROM: R knee AAROM: 0-70 degrees Other Exercises Other Exercises: Pt performed supine ther-ex including R LE ankle pumps, quad sets, hip abd/add, knee flexion stretches, and SLRs. All ther-ex performed x 10 reps with cga for assistance and cues for sequencing.      Assessment/Plan    PT Assessment Patient needs continued PT services  PT Diagnosis Difficulty walking;Generalized weakness;Acute pain   PT Problem List Decreased strength;Decreased range of motion;Decreased balance;Decreased mobility;Pain  PT Treatment Interventions Gait training;Therapeutic exercise   PT Goals (Current goals can be found in the Care Plan section) Acute Rehab PT Goals Patient Stated Goal: to get stronger PT Goal Formulation: With patient Time For Goal Achievement: 06/28/15 Potential to Achieve Goals: Good    Frequency BID   Barriers to discharge        Co-evaluation               End of Session Equipment Utilized During Treatment: Gait belt Activity Tolerance: Patient tolerated treatment well Patient left: in chair;with chair alarm set;with nursing/sitter in room Nurse Communication: Mobility status         Time: [id number] PT Time Calculation (min) (ACUTE ONLY): 38 min   Charges:   PT Evaluation $PT Eval Moderate Complexity: 1 Procedure PT Treatments $Therapeutic Exercise: 8-22 mins   PT G Codes:        Yarelie Hams 06/14/2015, 5:18 PM Elizabeth PalauStephanie Shaquasha Gerstel, PT, DPT [phone]

## 2015-06-14 NOTE — Anesthesia Procedure Notes (Addendum)
Spinal Patient location during procedure: OR Start time: 06/14/2015 8:05 AM End time: 06/14/2015 8:09 AM Staffing Anesthesiologist: Yevette EdwardsADAMS, Kaseem G Resident/CRNA: Ginger CarneMICHELET, Mohanad Carsten Performed by: anesthesiologist and resident/CRNA  Preanesthetic Checklist Completed: patient identified, site marked, surgical consent, pre-op evaluation, timeout performed, IV checked, risks and benefits discussed and monitors and equipment checked Spinal Block Patient position: sitting Prep: ChloraPrep Patient monitoring: cardiac monitor, continuous pulse ox and blood pressure Approach: midline Location: L3-4 Injection technique: single-shot Needle Needle type: Whitacre  Needle gauge: 25 G Needle length: 9 cm  Date/Time: 06/14/2015 8:14 AM Performed by: Ginger CarneMICHELET, Gennell How Pre-anesthesia Checklist: Patient identified, Emergency Drugs available, Suction available and Patient being monitored Patient Re-evaluated:Patient Re-evaluated prior to inductionOxygen Delivery Method: Simple face mask

## 2015-06-14 NOTE — H&P (Signed)
Paper H&P to be scanned into permanent record. H&P reviewed. No changes. 

## 2015-06-15 ENCOUNTER — Encounter: Payer: Self-pay | Admitting: Surgery

## 2015-06-15 LAB — CBC WITH DIFFERENTIAL/PLATELET
Basophils Absolute: 0 10*3/uL (ref 0–0.1)
Basophils Relative: 0 %
Eosinophils Absolute: 0.5 10*3/uL (ref 0–0.7)
Eosinophils Relative: 8 %
HEMATOCRIT: 37.4 % — AB (ref 40.0–52.0)
HEMOGLOBIN: 12.8 g/dL — AB (ref 13.0–18.0)
LYMPHS ABS: 1.1 10*3/uL (ref 1.0–3.6)
Lymphocytes Relative: 18 %
MCH: 30.8 pg (ref 26.0–34.0)
MCHC: 34.2 g/dL (ref 32.0–36.0)
MCV: 90.1 fL (ref 80.0–100.0)
MONO ABS: 0.5 10*3/uL (ref 0.2–1.0)
NEUTROS ABS: 4.3 10*3/uL (ref 1.4–6.5)
Platelets: 79 10*3/uL — ABNORMAL LOW (ref 150–440)
RBC: 4.16 MIL/uL — ABNORMAL LOW (ref 4.40–5.90)
RDW: 15.3 % — AB (ref 11.5–14.5)
WBC: 6.4 10*3/uL (ref 3.8–10.6)

## 2015-06-15 LAB — BASIC METABOLIC PANEL
Anion gap: 5 (ref 5–15)
BUN: 15 mg/dL (ref 6–20)
CHLORIDE: 107 mmol/L (ref 101–111)
CO2: 29 mmol/L (ref 22–32)
CREATININE: 1.08 mg/dL (ref 0.61–1.24)
Calcium: 8.6 mg/dL — ABNORMAL LOW (ref 8.9–10.3)
GFR calc Af Amer: >60 mL/min (ref >60)
GFR calc non Af Amer: >60 mL/min (ref >60)
GLUCOSE: 112 mg/dL — AB (ref 65–99)
Potassium: 4.1 mmol/L (ref 3.5–5.1)
Sodium: 141 mmol/L (ref 135–145)

## 2015-06-15 LAB — GLUCOSE, CAPILLARY
GLUCOSE-CAPILLARY: 118 mg/dL — AB (ref 65–99)
Glucose-Capillary: 109 mg/dL — ABNORMAL HIGH (ref 65–99)
Glucose-Capillary: 131 mg/dL — ABNORMAL HIGH (ref 65–99)
Glucose-Capillary: 127 mg/dL — ABNORMAL HIGH (ref 65–99)

## 2015-06-15 MED ORDER — TRAMADOL HCL 50 MG PO TABS
50.0000 mg | ORAL_TABLET | Freq: Four times a day (QID) | ORAL | Status: DC | PRN
  Administered 2015-06-15: 50 mg via ORAL
  Administered 2015-06-15: 100 mg via ORAL
  Administered 2015-06-15: 50 mg via ORAL
  Filled 2015-06-15 (×2): qty 1
  Filled 2015-06-15: qty 2

## 2015-06-15 MED ORDER — METOPROLOL TARTRATE 25 MG PO TABS
25.0000 mg | ORAL_TABLET | Freq: Two times a day (BID) | ORAL | Status: DC
  Administered 2015-06-15 – 2015-06-17 (×5): 25 mg via ORAL
  Filled 2015-06-15 (×5): qty 1

## 2015-06-15 NOTE — Anesthesia Postprocedure Evaluation (Signed)
Anesthesia Post Note  Patient: Alex [name]  Procedure(s) Performed: Procedure(s) (LRB): UNICOMPARTMENTAL KNEE (Right)  Patient location during evaluation: Other Anesthesia Type: Spinal Level of consciousness: awake and alert Pain management: pain level controlled Vital Signs Assessment: post-procedure vital signs reviewed and stable Respiratory status: spontaneous breathing and respiratory function stable Cardiovascular status: blood pressure returned to baseline and stable Postop Assessment: spinal receding Anesthetic complications: no    Last Vitals:  Filed Vitals:   06/15/15 0417 06/15/15 0730  BP: 148/77 189/92  Pulse: 68 79  Temp: 36.7 C 36.6 C  Resp: 19 18    Last Pain:  Filed Vitals:   06/15/15 0730  PainSc: 5                  Stormy Fabianurtis,  Talynn Lebon A

## 2015-06-15 NOTE — Progress Notes (Signed)
Physical Therapy Treatment Patient Details Name: Alex [name] MRN: [id number] DOB: August 27, 1932 Today's Date: 06/15/2015    History of Present Illness Pt admitted for R partial knee replacement sx on 06/14/15. Pt evaluated on POD 0. Pt with history of CVA and cognitive impairments.    PT Comments    Pt having great difficulty with pain level in R knee today. Pt rates a 50 on a 10 point scale with 10 being the worst. Pt wishes back to bed. Spouse notes pt attempted to work on stretching after lunch, but was unable due to pain. Pt has received some medication, but it has been ineffective to this point. Pt returned to bed demonstrating difficulty bearing weight/ambulating on right lower extremity. Encouraged continued ankle pumps and quad sets until pain more managed. Pt unable to tolerate ankle roll; therefore, roll placed mid calf. Continue PT to progress range of motion, strength and all functional mobility. May need to consider skilled nursing facility post discharge versus going home if pt unable to progress ambulation and perform steps.   Follow Up Recommendations  Home health PT;Supervision for mobility/OOB (versus SNF if pt unable to demonstrate improved amb/steps)     Equipment Recommendations  None recommended by PT    Recommendations for Other Services       Precautions / Restrictions Precautions Precautions: Fall;Knee Restrictions Weight Bearing Restrictions: Yes RLE Weight Bearing: Weight bearing as tolerated    Mobility  Bed Mobility Overal bed mobility: Needs Assistance Bed Mobility: Sit to Supine     Supine to sit: Min guard;Min assist (occasional Min A to steady trunk while sccoting edge of bed) Sit to supine: Min assist   General bed mobility comments: assist for LEs  Transfers Overall transfer level: Needs assistance Equipment used: Rolling walker (2 wheeled) Transfers: Sit to/from Stand Sit to Stand: Min assist         General transfer comment: cues to  use RLE at least as tolerated and for balance. Several attempts to rise to stand  Ambulation/Gait Ambulation/Gait assistance: Min guard Ambulation Distance (Feet): 3 Feet (chair to bed) Assistive device: Rolling walker (2 wheeled) Gait Pattern/deviations: Step-to pattern;Decreased stance time - right;Decreased weight shift to right;Antalgic Gait velocity: reduced Gait velocity interpretation: <1.8 ft/sec, indicative of risk for recurrent falls General Gait Details: Continues with difficulty weightbearing on RLE   Stairs            Wheelchair Mobility    Modified Rankin (Stroke Patients Only)       Balance Overall balance assessment: Needs assistance Sitting-balance support: Bilateral upper extremity supported;Feet supported Sitting balance-Leahy Scale: Fair Sitting balance - Comments: loses balance with dnamic sit/scooting to edge of bed; pt also leans L one time without UE support and loses balance as well requiring min A to correct   Standing balance support: Bilateral upper extremity supported Standing balance-Leahy Scale: Fair                      Cognition Arousal/Alertness: Lethargic Behavior During Therapy: WFL for tasks assessed/performed Overall Cognitive Status: Within Functional Limits for tasks assessed                      Exercises Total Joint Exercises Ankle Circles/Pumps: AROM;Both;20 reps;Supine Quad Sets: Strengthening;Both;20 reps;Supine Heel Slides: AAROM;Right;5 reps;Seated (2 positions each repetition) Hip ABduction/ADduction: AAROM;Right;20 reps;Supine Straight Leg Raises: AAROM;Right;10 reps;Supine (2 sets) Goniometric ROM: 4-86    General Comments        Pertinent  Vitals/Pain Pain Assessment: 0-10 Pain Score: 10-Worst pain ever (10++) Pain Location: R knee Pain Descriptors / Indicators: Aching;Tightness;Throbbing Pain Intervention(s): Limited activity within patient's tolerance;Repositioned;Premedicated before  session;Ice applied    Home Living                      Prior Function            PT Goals (current goals can now be found in the care plan section) Progress towards PT goals: Progressing toward goals (slowly)    Frequency  BID    PT Plan Current plan remains appropriate    Co-evaluation             End of Session Equipment Utilized During Treatment: Gait belt Activity Tolerance: Patient limited by pain;Patient limited by lethargy Patient left: in bed;with call bell/phone within reach;with bed alarm set;with family/visitor present (polar care in place)     Time: 1425-1441 PT Time Calculation (min) (ACUTE ONLY): 16 min  Charges:  $Gait Training: 8-22 mins $Therapeutic Exercise: 8-22 mins                    G Codes:      Kristeen Miss 06/15/2015, 2:48 PM

## 2015-06-15 NOTE — Progress Notes (Addendum)
Subjective: 1 Day Post-Op Procedure(s) (LRB): UNICOMPARTMENTAL KNEE (Right) Patient reports pain as 7 on 0-10 scale.   Patient is well, patient confused this AM. Plan is to go home with homehealth PT after hospital stay. Negative for chest pain and shortness of breath Fever: no Gastrointestinal:Negative for nausea and vomiting  Objective: Vital signs in last 24 hours: Temp:  [96 F (35.6 C)-98 F (36.7 C)] 98 F (36.7 C) (01/04 0417) Pulse Rate:  [49-68] 68 (01/04 0417) Resp:  [14-19] 19 (01/04 0417) BP: (123-153)/(58-78) 148/77 mmHg (01/04 0417) SpO2:  [96 %-100 %] 96 % (01/04 0417) Weight:  [85.684 kg (188 lb 14.4 oz)] 85.684 kg (188 lb 14.4 oz) (01/03 1210)  Intake/Output from previous day:  Intake/Output Summary (Last 24 hours) at 06/15/15 0721 Last data filed at 06/15/15 0604  Gross per 24 hour  Intake 2862.5 ml  Output   1955 ml  Net  907.5 ml    Intake/Output this shift:    Labs:  Recent Labs  06/15/15 0609  HGB 12.8*    Recent Labs  06/14/15 0710 06/15/15 0609  WBC  --  6.4  RBC  --  4.16*  HCT  --  37.4*  PLT 79* 79*    Recent Labs  06/15/15 0609  NA 141  K 4.1  CL 107  CO2 29  BUN 15  CREATININE 1.08  GLUCOSE 112*  CALCIUM 8.6*   No results for input(s): LABPT, INR in the last 72 hours.   EXAM General - Patient is Alert, Confused and Lacking, pt unable to answer when he underwent surgery. Extremity - ABD soft Sensation intact distally Intact pulses distally Dorsiflexion/Plantar flexion intact Incision: moderate drainage Dressing/Incision - blood tinged drainage Motor Function - intact, moving foot and toes well on exam.   Pt complaining of a cramping sensation in his left leg, denies any numbness. Abdomen slightly distended, normal BS without tympany. Honeycomb dressing and ACE wrap changed.  Past Medical History  Diagnosis Date  . CAD (coronary artery disease)     post CABG; myoview 2009 neg  . Hyperlipidemia   .  Hypertension   . RBBB (right bundle branch block)   . Hepatitis C     Inactive  . Thrombocytopenia (HCC)   . GERD (gastroesophageal reflux disease)   . Carotid artery disease (HCC)     Mild  . Memory loss   . Idiopathic thrombocytopenia (HCC)   . Cirrhosis of liver (HCC)   . Right tibial fracture 2014  . TIA (transient ischemic attack)   . Myocardial infarction [facility]) 1996    [place]  . Stroke [facility]) February 2016    [facility]  . Arthritis   . Right bundle branch block (RBBB)   . TIA (transient ischemic attack)   . Dementia     Assessment/Plan: 1 Day Post-Op Procedure(s) (LRB): UNICOMPARTMENTAL KNEE (Right) Active Problems:   Status post right partial knee replacement  Estimated body mass index is 29.58 kg/(m^2) as calculated from the following:   Height as of this encounter: 5\' 7"  (1.702 m).   Weight as of this encounter: 85.684 kg (188 lb 14.4 oz). Advance diet Up with therapy D/C IV fluids when tolerating PO intake.  Pt has chronically low platelets, stable at 79 this AM. CBC abd BMP ordered for tomorrow morning. Pt confused this AM, has history of memory loss.  Will continue to monitor for improvement as the day continues. Previous history of stroke, complaining of left leg cramping. No facial  drooping or slurring of words this AM. Will add tramadol and will attempt to stop oxycodone and control pain with tylenol. Plan on discharge potentially tomorrow.  Need to continue to monitor confusion.  DVT Prophylaxis - Lovenox, Foot Pumps and TED hose Weight-Bearing as tolerated to right leg  J. Horris LatinoLance McGhee, PA-C [facility] 06/15/2015, 7:21 AM

## 2015-06-15 NOTE — Clinical Social Work Note (Signed)
Clinical Social Worker consulted for [facility]. PT recommending HHPT. RNCM is following for discharge planning needs. CSW is signing off, please reconsult if a need arises prior to discharge.   Dede QuerySarah Eyden Dobie, MSW, LCSW Clinical Social Worker  [phone]

## 2015-06-15 NOTE — Care Management Note (Signed)
Case Management Note  Patient Details  Name: Alex [name] MRN: [id number] Date of Birth: 20-Oct-1932  Subjective/Objective:         Patient admitted status post Right unicondylar knee arthroplasty.  Patient is currently in pain and history provided by wife.  Patient obtains his medications from mail order scripts, and uses [facility] when needed. Patient lives at home with his wife, and has family local for support.  Patient has a rolling walker, and BSC at home. Wife will be transporting patient to follow up appointments.  [facility] has been [facility] with [facility] in the past and would like to use them again at time of discharge.  Patient [facility] and [facility].             Action/Plan: Barbara CowerJason from [facility] made aware of pending referral   Expected Discharge Date:                  Expected Discharge Plan:     In-House Referral:     Discharge planning Services     Post Acute Care Choice:    Choice offered to:     DME Arranged:    DME Agency:     HH Arranged:    HH Agency:     Status of Service:     Medicare Important Message Given:    Date Medicare IM Given:    Medicare IM give by:    Date Additional Medicare IM Given:    Additional Medicare Important Message give by:     If discussed at Long Length of Stay Meetings, dates discussed:    Additional Comments:  Chapman FitchBOWEN, Sacred Roa T, RN 06/15/2015, 1:25 PM

## 2015-06-15 NOTE — Progress Notes (Signed)
Physical Therapy Treatment Patient Details Name: Alex [name] H Stroschein MRN: [id number] DOB: [birth year] Today's Date: 06/15/2015    History of Present Illness Pt admitted for R partial knee replacement sx on 06/14/15. Pt evaluated on POD 0. Pt with history of CVA and cognitive impairments.    PT Comments    Pt agreeable to PT; has complains of increased right knee pain (8/10). Pt demonstrated initial poor quad set with improvement with cueing and continued work. Unable to full extend right knee in supine or stand. Bed mobility with increased time/effort and min A at times for trunk balance. Right knee flexion to 86 degrees with increased discomfort. Pt demonstrates difficulty bearing weight on right lower extremity with transfer to stand and ambulation, but improves with cueing and increased upper extremity use on rolling walker. Pt educated on using right lower extremity with sit to/from stand for opportunity to stretch and strengthen; pt understands. Spouse present for session and very involved. Spouse understands instructions for exercise and function well. Encouraged stretching, quad sets and ankle pumps throughout the day. Will provide exercise packet in afternoon session and work on continued range of motion, strength and ambulation.   Follow Up Recommendations  Home health PT;Supervision for mobility/OOB     Equipment Recommendations  None recommended by PT    Recommendations for Other Services       Precautions / Restrictions Precautions Precautions: Fall;Knee Restrictions Weight Bearing Restrictions: Yes RLE Weight Bearing: Weight bearing as tolerated    Mobility  Bed Mobility Overal bed mobility: Needs Assistance Bed Mobility: Supine to Sit     Supine to sit: Min guard;Min assist (occasional Min A to steady trunk while sccoting edge of bed)     General bed mobility comments: increased time/effort especially with scooting to edge of bed. Loses trunk balance with  scooting  Transfers Overall transfer level: Needs assistance Equipment used: Rolling walker (2 wheeled) Transfers: Sit to/from Stand Sit to Stand: Min assist         General transfer comment: Diffiiculty attaining full upright stand requiring min A and verbal/tactile cueing for quad and glute squeeze  Ambulation/Gait Ambulation/Gait assistance: Min guard Ambulation Distance (Feet): 5 Feet Assistive device: Rolling walker (2 wheeled) Gait Pattern/deviations: Step-to pattern;Decreased stance time - right;Decreased weight shift to right;Antalgic Gait velocity: reduced Gait velocity interpretation: <1.8 ft/sec, indicative of risk for recurrent falls General Gait Details: Pt tries to avoid placing weight on RLE causing mild unsteadiness; pt instructed/encouraged to bear weight through arms to decrease weight on RLE with steps; improved with cues   Stairs            Wheelchair Mobility    Modified Rankin (Stroke Patients Only)       Balance Overall balance assessment: Needs assistance Sitting-balance support: Bilateral upper extremity supported;Feet supported Sitting balance-Leahy Scale: Fair Sitting balance - Comments: loses balance with dnamic sit/scooting to edge of bed; pt also leans L one time without UE support and loses balance as well requiring min A to correct   Standing balance support: Bilateral upper extremity supported Standing balance-Leahy Scale: Fair                      Cognition Arousal/Alertness: Awake/alert Behavior During Therapy: WFL for tasks assessed/performed Overall Cognitive Status: Within Functional Limits for tasks assessed                      Exercises Total Joint Exercises Ankle Circles/Pumps: AROM;Both;20 reps;Supine Quad Sets: Strengthening;Both;20  reps;Supine (gravity assisted on R) Heel Slides: AAROM;Right;5 reps;Seated (2 positions each repetition) Goniometric ROM: 4-86    General Comments        Pertinent  Vitals/Pain Pain Assessment: 0-10 Pain Score: 8  Pain Location: R knee Pain Descriptors / Indicators: Aching;Tightness;Throbbing Pain Intervention(s): Monitored during session;Limited activity within patient's tolerance;Premedicated before session;Repositioned;Ice applied    Home Living                      Prior Function            PT Goals (current goals can now be found in the care plan section) Progress towards PT goals: Progressing toward goals    Frequency  BID    PT Plan Current plan remains appropriate    Co-evaluation             End of Session Equipment Utilized During Treatment: Gait belt Activity Tolerance: Patient limited by pain Patient left: in chair;with call bell/phone within reach;with chair alarm set;with family/visitor present;with SCD's reapplied (polar care in place)     Time: 4098-1191 PT Time Calculation (min) (ACUTE ONLY): 30 min  Charges:  $Gait Training: 8-22 mins $Therapeutic Exercise: 8-22 mins                    G Codes:      Kristeen Miss 06/15/2015, 12:10 PM

## 2015-06-16 LAB — BASIC METABOLIC PANEL
Anion gap: 5 (ref 5–15)
BUN: 13 mg/dL (ref 6–20)
CHLORIDE: 104 mmol/L (ref 101–111)
CO2: 26 mmol/L (ref 22–32)
Calcium: 8.4 mg/dL — ABNORMAL LOW (ref 8.9–10.3)
Creatinine, Ser: 0.81 mg/dL (ref 0.61–1.24)
GFR calc Af Amer: >60 mL/min (ref >60)
GFR calc non Af Amer: >60 mL/min (ref >60)
GLUCOSE: 132 mg/dL — AB (ref 65–99)
POTASSIUM: 4.3 mmol/L (ref 3.5–5.1)
Sodium: 135 mmol/L (ref 135–145)

## 2015-06-16 LAB — CBC
HEMATOCRIT: 36.9 % — AB (ref 40.0–52.0)
HEMOGLOBIN: 12.7 g/dL — AB (ref 13.0–18.0)
MCH: 31.3 pg (ref 26.0–34.0)
MCHC: 34.5 g/dL (ref 32.0–36.0)
MCV: 90.7 fL (ref 80.0–100.0)
Platelets: 83 10*3/uL — ABNORMAL LOW (ref 150–440)
RBC: 4.06 MIL/uL — ABNORMAL LOW (ref 4.40–5.90)
RDW: 15.2 % — AB (ref 11.5–14.5)
WBC: 8 10*3/uL (ref 3.8–10.6)

## 2015-06-16 LAB — GLUCOSE, CAPILLARY
Glucose-Capillary: 119 mg/dL — ABNORMAL HIGH (ref 65–99)
Glucose-Capillary: 147 mg/dL — ABNORMAL HIGH (ref 65–99)
Glucose-Capillary: 140 mg/dL — ABNORMAL HIGH (ref 65–99)

## 2015-06-16 NOTE — Progress Notes (Signed)
Pt alert but confused during the night. Could not tolerate CPM during the night. Honeycomb dsg changed, pt removed dsg.

## 2015-06-16 NOTE — Care Management Important Message (Signed)
Important Message  Patient Details  Name: Alex [name] MRN: [id number] Date of Birth: Oct 31, 1932   Medicare Important Message Given:  Yes    Olegario MessierKathy A Saulo Anthis 06/16/2015, 10:50 AM

## 2015-06-16 NOTE — Progress Notes (Signed)
Physical Therapy Treatment Patient Details Name: Alex [name] MRN: [id number] DOB: March 09, 1933 Today's Date: 06/16/2015    History of Present Illness Pt admitted for R partial knee replacement sx on 06/14/15. Pt evaluated on POD 0. Pt with history of CVA and cognitive impairments.    PT Comments    Pt continues lethargic, but agreeable to up in chair. Pt reports pain has decreased to 3/10 subjectively, but makes faces/grimaces indicative of 10/10 pain with bend/transfers. Pt requiring increased assist with bed mobility due to reaction to pain causing pt to lose balance with supine to sit. STS also requires increased assist today; however, pt better able to demonstrate weightbearing through right lower extremity with ambulation using heavy lean on rolling walker.Pt comfortable up in chair and encouraged to continue with ankle pumps and quad sets. Continue PT to progress range of motion, strength, and all functional mobility.   Follow Up Recommendations  SNF (due to difficulty with all functional mobility/decreased amb)     Equipment Recommendations  None recommended by PT    Recommendations for Other Services       Precautions / Restrictions Precautions Precautions: Fall;Knee Restrictions Weight Bearing Restrictions: No RLE Weight Bearing: Weight bearing as tolerated    Mobility  Bed Mobility Overal bed mobility: Needs Assistance Bed Mobility: Supine to Sit     Supine to sit: Mod assist;HOB elevated     General bed mobility comments: Assist for LEs and trunk due to reacting to pain and losing balance  Transfers Overall transfer level: Needs assistance Equipment used: Rolling walker (2 wheeled) Transfers: Sit to/from Stand Sit to Stand: Mod assist         General transfer comment: slow to initiate/rise requiring increased assist today. Cues for using LUE to push off/reach back for transfer to de weight RLE  Ambulation/Gait Ambulation/Gait assistance: Min  guard Ambulation Distance (Feet): 4 Feet Assistive device: Rolling walker (2 wheeled)   Gait velocity: reduced   General Gait Details: improved sequence for step to bed to chair. improved demonstration of bearing weight on RLE with heavy use of rw   Stairs            Wheelchair Mobility    Modified Rankin (Stroke Patients Only)       Balance   Sitting-balance support: Bilateral upper extremity supported;Feet supported Sitting balance-Leahy Scale: Fair Sitting balance - Comments: loses balance attempting to sit due to pain, but once seated and supported, fair balance   Standing balance support: Bilateral upper extremity supported Standing balance-Leahy Scale: Fair                      Cognition Arousal/Alertness: Lethargic Behavior During Therapy: WFL for tasks assessed/performed Overall Cognitive Status: Within Functional Limits for tasks assessed                      Exercises Total Joint Exercises Ankle Circles/Pumps: AROM;Both;20 reps (long sit) Quad Sets: Strengthening;Both;20 reps;Standing (with weight shifting) Short Arc Quad: AAROM;Right;20 reps;Supine Heel Slides: AAROM;Right;20 reps (with 5 second hold ) Hip ABduction/ADduction: AAROM;Right;20 reps;Supine Knee Flexion: PROM;Right;Other reps (comment);Supine;Other (comment) (over pillow 5 minutes; half the time to get heel to bed) Goniometric ROM: 5-85    General Comments        Pertinent Vitals/Pain Pain Assessment: 0-10 Pain Score: 3  (grimaces/faces matching 10 with bend/STS transfers) Pain Location: R knee Pain Descriptors / Indicators: Aching Pain Intervention(s): Limited activity within patient's tolerance;Monitored during session;Ice applied  Home Living                      Prior Function            PT Goals (current goals can now be found in the care plan section) Progress towards PT goals: Progressing toward goals (slowly)    Frequency  BID    PT  Plan Discharge plan needs to be updated    Co-evaluation             End of Session Equipment Utilized During Treatment: Gait belt Activity Tolerance: No increased pain;Patient limited by lethargy Patient left: in chair;with call bell/phone within reach;with family/visitor present;Other (comment) (polar care in place; spouse notes to leave alarm off )     Time: 4540-9811 PT Time Calculation (min) (ACUTE ONLY): 23 min  Charges:  $Gait Training: 8-22 mins $Therapeutic Exercise: 8-22 mins                    G Codes:      Kristeen Miss 06/16/2015, 3:52 PM

## 2015-06-16 NOTE — Progress Notes (Signed)
Physical Therapy Treatment Patient Details Name: DAMIER DISANO MRN: [id number] DOB: 1932-07-15 Today's Date: 06/16/2015    History of Present Illness Pt admitted for R partial knee replacement sx on 06/14/15. Pt evaluated on POD 0. Pt with history of CVA and cognitive impairments.    PT Comments    Pt rates pain as decreased today (5/10); pt just received pain medication an is quite lethargic. Pt does; however, continue to present with a fair amount of pain with movement/exercises and is quite guarded. Pt requires increased instruction/cueing to perform right quad set with gravity assisted extension. Pt also requires increased time to allow R knee to passively bend over pillow, so that R heel touches the bed. Deferred mobility out of bed at this time due to lethargy from pain medication, but will plan to work on out of bed/ambulation in afternoon session.   Follow Up Recommendations  Home health PT;Supervision for mobility/OOB (may need skilled nursing if unable to demonstrate amb/steps)     Equipment Recommendations  None recommended by PT    Recommendations for Other Services       Precautions / Restrictions Precautions Precautions: Fall;Knee Restrictions Weight Bearing Restrictions: No RLE Weight Bearing: Weight bearing as tolerated    Mobility  Bed Mobility               General bed mobility comments: Not tested in a.m. session; plan to in p.m. session; pt lying shifted on R side; repositioned to actual supine for exercises and R knee extension post session.  Transfers                    Ambulation/Gait                 Stairs            Wheelchair Mobility    Modified Rankin (Stroke Patients Only)       Balance                                    Cognition Arousal/Alertness: Lethargic Behavior During Therapy: WFL for tasks assessed/performed Overall Cognitive Status: Within Functional Limits for tasks assessed                      Exercises Total Joint Exercises Ankle Circles/Pumps: AROM;Both;20 reps;Supine Quad Sets: Strengthening;Both;20 reps;Supine (increased verbal/tactile cueing needed) Short Arc Quad: AAROM;Right;20 reps;Supine Heel Slides: AAROM;Right;20 reps;Supine (very guarded) Hip ABduction/ADduction: AAROM;Right;20 reps;Supine Knee Flexion: PROM;Right;Other reps (comment);Supine;Other (comment) (over pillow 5 minutes; half the time to get heel to bed)    General Comments        Pertinent Vitals/Pain Pain Assessment: 0-10 Pain Score: 5  Pain Location: R knee Pain Intervention(s): Limited activity within patient's tolerance;Monitored during session;Ice applied    Home Living                      Prior Function            PT Goals (current goals can now be found in the care plan section) Progress towards PT goals:  (to determine in afternoon session)    Frequency  BID    PT Plan Current plan remains appropriate    Co-evaluation             End of Session   Activity Tolerance: Patient limited by pain;Patient limited by lethargy Patient left: in bed;with  call bell/phone within reach;with bed alarm set;with family/visitor present (polar care in place)     Time: [id number] PT Time Calculation (min) (ACUTE ONLY): 19 min  Charges:  $Therapeutic Exercise: 8-22 mins                    G Codes:      Kristeen MissHeidi Elizabeth Bishop 06/16/2015, 1:02 PM

## 2015-06-16 NOTE — Progress Notes (Signed)
Subjective: 2 Days Post-Op Procedure(s) (LRB): UNICOMPARTMENTAL KNEE (Right) Patient reports pain as 5 on 0-10 scale.   Patient is well, and more alert this AM. Plan is to go home with homehealth PT after hospital stay. Negative for chest pain and shortness of breath Fever: no Gastrointestinal:Negative for nausea and vomiting  Objective: Vital signs in last 24 hours: Temp:  [97.9 F (36.6 C)-98.6 F (37 C)] 98.6 F (37 C) (01/05 0435) Pulse Rate:  [56-79] 58 (01/05 0435) Resp:  [18-19] 18 (01/05 0435) BP: (138-189)/(79-92) 180/83 mmHg (01/05 0435) SpO2:  [95 %-97 %] 96 % (01/05 0435)  Intake/Output from previous day:  Intake/Output Summary (Last 24 hours) at 06/16/15 0727 Last data filed at 06/16/15 0417  Gross per 24 hour  Intake 2246.25 ml  Output   1000 ml  Net 1246.25 ml    Intake/Output this shift:    Labs:  Recent Labs  06/15/15 0609 06/16/15 0516  HGB 12.8* 12.7*    Recent Labs  06/15/15 0609 06/16/15 0516  WBC 6.4 8.0  RBC 4.16* 4.06*  HCT 37.4* 36.9*  PLT 79* 83*    Recent Labs  06/15/15 0609 06/16/15 0516  NA 141 135  K 4.1 4.3  CL 107 104  CO2 29 26  BUN 15 13  CREATININE 1.08 0.81  GLUCOSE 112* 132*  CALCIUM 8.6* 8.4*   No results for input(s): LABPT, INR in the last 72 hours.   EXAM General - Patient is Alert, Confused and Lacking, pt unable to answer when he underwent surgery. Extremity - ABD soft Sensation intact distally Intact pulses distally Dorsiflexion/Plantar flexion intact Incision: scant drainage Dressing/Incision - blood tinged drainage Motor Function - intact, moving foot and toes well on exam.   Pt not complaining of pain in the left leg this AM. Abdomen slightly distended, normal BS without tympany.  Past Medical History  Diagnosis Date  . CAD (coronary artery disease)     post CABG; myoview 2009 neg  . Hyperlipidemia   . Hypertension   . RBBB (right bundle branch block)   . Hepatitis C     Inactive   . Thrombocytopenia (HCC)   . GERD (gastroesophageal reflux disease)   . Carotid artery disease (HCC)     Mild  . Memory loss   . Idiopathic thrombocytopenia (HCC)   . Cirrhosis of liver (HCC)   . Right tibial fracture 2014  . TIA (transient ischemic attack)   . Myocardial infarction [facility]) 1996    [place]  . Stroke [facility]) February 2016    [facility]  . Arthritis   . Right bundle branch block (RBBB)   . TIA (transient ischemic attack)   . Dementia     Assessment/Plan: 2 Days Post-Op Procedure(s) (LRB): UNICOMPARTMENTAL KNEE (Right) Active Problems:   Status post right partial knee replacement  Estimated body mass index is 29.58 kg/(m^2) as calculated from the following:   Height as of this encounter: 5\' 7"  (1.702 m).   Weight as of this encounter: 85.684 kg (188 lb 14.4 oz). [facility] diet Up with therapy   Pt has chronically low platelets, stable at 83 this AM. CBC abd BMP ordered for tomorrow morning. Pt more alert this AM, has history of memory loss.  Will continue to monitor for improvement as the day continues. Pt tolerating PO intake. Will add tramadol and will attempt to stop oxycodone and control pain with tylenol. Plan on discharge potentially tomorrow.  Continue to work with PT, PT may suggest SNF  depending on advancement with therapy.  DVT Prophylaxis - Lovenox, Foot Pumps and TED hose Weight-Bearing as tolerated to right leg  J. Horris LatinoLance Riannah Stagner, PA-C [facility] 06/16/2015, 7:27 AM

## 2015-06-17 LAB — CBC
HEMATOCRIT: 37.8 % — AB (ref 40.0–52.0)
Hemoglobin: 12.9 g/dL — ABNORMAL LOW (ref 13.0–18.0)
MCH: 31.8 pg (ref 26.0–34.0)
MCHC: 34.2 g/dL (ref 32.0–36.0)
MCV: 92.9 fL (ref 80.0–100.0)
PLATELETS: 85 10*3/uL — AB (ref 150–440)
RBC: 4.06 MIL/uL — AB (ref 4.40–5.90)
RDW: 16 % — AB (ref 11.5–14.5)
WBC: 8.2 10*3/uL (ref 3.8–10.6)

## 2015-06-17 LAB — BASIC METABOLIC PANEL
Anion gap: 4 — ABNORMAL LOW (ref 5–15)
BUN: 16 mg/dL (ref 6–20)
CHLORIDE: 103 mmol/L (ref 101–111)
CO2: 30 mmol/L (ref 22–32)
Calcium: 9.1 mg/dL (ref 8.9–10.3)
Creatinine, Ser: 0.88 mg/dL (ref 0.61–1.24)
Glucose, Bld: 119 mg/dL — ABNORMAL HIGH (ref 65–99)
POTASSIUM: 4.5 mmol/L (ref 3.5–5.1)
SODIUM: 137 mmol/L (ref 135–145)

## 2015-06-17 LAB — GLUCOSE, CAPILLARY
GLUCOSE-CAPILLARY: 154 mg/dL — AB (ref 65–99)
Glucose-Capillary: 107 mg/dL — ABNORMAL HIGH (ref 65–99)

## 2015-06-17 MED ORDER — OXYCODONE HCL 5 MG PO TABS: 5.0000 mg | ORAL_TABLET | ORAL | Status: DC | PRN

## 2015-06-17 MED ORDER — TRAMADOL HCL 50 MG PO TABS: 50.0000 mg | ORAL_TABLET | Freq: Four times a day (QID) | ORAL | Status: DC | PRN

## 2015-06-17 NOTE — Progress Notes (Signed)
Physical Therapy Treatment Patient Details Name: Alex [name] MRN: [id number] DOB: 1933/05/29 Today's Date: 06/17/2015    History of Present Illness Pt admitted for R partial knee replacement sx on 06/14/15. Pt evaluated on POD 0. Pt with history of CVA and cognitive impairments.    PT Comments    Pt is able to show increased ambulation tolerance today, though he is still very slow, hesitant and unsafe.  Pt shows good effort with exercises and ambulation but is very pain limited and struggles with exercises especially knee flexion.  Pt needing a lot of cuing and encouragement to tolerate even minimal knee flexion and it was with great effort that we were able to even get to 60 degrees.  Follow Up Recommendations  SNF     Equipment Recommendations       Recommendations for Other Services       Precautions / Restrictions Precautions Precautions: Fall;Knee Restrictions RLE Weight Bearing: Weight bearing as tolerated    Mobility  Bed Mobility Overal bed mobility: Needs Assistance Bed Mobility: Supine to Sit     Supine to sit: Min assist     General bed mobility comments: Pt shows much better ability to assist getting to EOB and using UEs on rail, pt needing a lot of verbal cuing and encouragement  Transfers Overall transfer level: Needs assistance Equipment used: Rolling walker (2 wheeled) Transfers: Sit to/from Stand Sit to Stand: Min assist         General transfer comment: Pt needs close assist and cuing to get to standing, but is actually able to rise w/o direct physical assist though he is unsteady and lacks confidence  Ambulation/Gait Ambulation/Gait assistance: Min assist;Mod assist Ambulation Distance (Feet): 20 Feet Assistive device: Rolling walker (2 wheeled)     Gait velocity interpretation: <1.8 ft/sec, indicative of risk for recurrent falls General Gait Details: Pt actually able to take multiple actual steps today with good use of walker to maintain  balance and though it is still slow and hesitant ambulation he was able to get to the door and back   Stairs            Wheelchair Mobility    Modified Rankin (Stroke Patients Only)       Balance                                    Cognition Arousal/Alertness: Awake/alert Behavior During Therapy: Anxious Overall Cognitive Status:  (pt appears to have some confusion t/o session)                      Exercises Total Joint Exercises Ankle Circles/Pumps: AROM;Both;20 reps Quad Sets: Strengthening;10 reps Gluteal Sets: Strengthening;10 reps Heel Slides: AAROM;Right;20 reps Hip ABduction/ADduction: AAROM;Right;20 reps;Supine Straight Leg Raises: AAROM;Right;10 reps;Supine Knee Flexion: PROM;5 reps Goniometric ROM: 3-62    General Comments        Pertinent Vitals/Pain Pain Score: 6  Pain Location: R knee pain increases significantly with ROM acts    Home Living                      Prior Function            PT Goals (current goals can now be found in the care plan section) Progress towards PT goals: Progressing toward goals    Frequency  BID    PT Plan Current plan remains  appropriate    Co-evaluation             End of Session Equipment Utilized During Treatment: Gait belt Activity Tolerance: Patient limited by pain Patient left: with chair alarm set;with call bell/phone within reach     Time: 0901-0929 PT Time Calculation (min) (ACUTE ONLY): 28 min  Charges:  $Gait Training: 8-22 mins $Therapeutic Exercise: 8-22 mins                    G Codes:     Loran SentersGalen Emylia Latella, PT, DPT [phone]   Malachi ProGalen R Smith Mcnicholas 06/17/2015, 11:19 AM

## 2015-06-17 NOTE — Discharge Instructions (Signed)
Diet: As you were doing prior to hospitalization   Shower:  May shower but keep the wounds dry, use an occlusive plastic wrap, NO SOAKING IN TUB.  If the bandage gets wet, change with a clean dry gauze.  Dressing:  You may change your dressing as needed. Change the dressing with sterile gauze dressing.    Activity:  Increase activity slowly as tolerated, but follow the weight bearing instructions below.  No lifting or driving for 6 weeks.  Weight Bearing:   Weight bearing as tolerated to right lower extremity  To prevent constipation: you may use a stool softener such as -  Colace (over the counter) 100 mg by mouth twice a day  Drink plenty of fluids (prune juice may be helpful) and high fiber foods Miralax (over the counter) for constipation as needed.    Itching:  If you experience itching with your medications, try taking only a single pain pill, or even half a pain pill at a time.  You may take up to 10 pain pills per day, and you can also use benadryl over the counter for itching or also to help with sleep.   Precautions:  If you experience chest pain or shortness of breath - call 911 immediately for transfer to the hospital emergency department!!  If you develop a fever greater that 101 F, purulent drainage from wound, increased redness or drainage from wound, or calf pain-Call [facility]   DVT Prophylaxis: Continue your current medication regimen of Plavix and Aspirin.                                              Follow- Up Appointment:  Please call [facility]

## 2015-06-17 NOTE — Progress Notes (Signed)
Subjective: 3 Days Post-Op Procedure(s) (LRB): UNICOMPARTMENTAL KNEE (Right) Patient reports pain as 2 on 0-10 scale.   Patient is well, and more alert this AM. Plan is to go to SNF or Homehealth with PT following discharge. Negative for chest pain and shortness of breath Fever: no Gastrointestinal:Negative for nausea and vomiting  Objective: Vital signs in last 24 hours: Temp:  [97.9 F (36.6 C)-99 F (37.2 C)] 99 F (37.2 C) (01/06 0441) Pulse Rate:  [54-60] 54 (01/06 0441) Resp:  [18] 18 (01/06 0441) BP: (132-177)/(73-92) 132/73 mmHg (01/06 0441) SpO2:  [95 %-98 %] 96 % (01/06 0441)  Intake/Output from previous day:  Intake/Output Summary (Last 24 hours) at 06/17/15 0729 Last data filed at 06/17/15 0450  Gross per 24 hour  Intake    480 ml  Output   1375 ml  Net   -895 ml    Intake/Output this shift:    Labs:  Recent Labs  06/15/15 0609 06/16/15 0516 06/17/15 0554  HGB 12.8* 12.7* 12.9*    Recent Labs  06/16/15 0516 06/17/15 0554  WBC 8.0 8.2  RBC 4.06* 4.06*  HCT 36.9* 37.8*  PLT 83* 85*    Recent Labs  06/16/15 0516 06/17/15 0554  NA 135 137  K 4.3 4.5  CL 104 103  CO2 26 30  BUN 13 16  CREATININE 0.81 0.88  GLUCOSE 132* 119*  CALCIUM 8.4* 9.1   No results for input(s): LABPT, INR in the last 72 hours.   EXAM General - Patient is Alert and Appropriate,  The patient is more responsive this AM and is able to answer questions appropriately. Extremity - ABD soft Sensation intact distally Intact pulses distally Dorsiflexion/Plantar flexion intact Incision: scant drainage Dressing/Incision - blood tinged drainage Motor Function - intact, moving foot and toes well on exam.   Pt not complaining of pain in the left leg this AM. Abdomen soft with normal BS.  Pt has had a BM while in the hospital.  Past Medical History  Diagnosis Date  . CAD (coronary artery disease)     post CABG; myoview 2009 neg  . Hyperlipidemia   . Hypertension    . RBBB (right bundle branch block)   . Hepatitis C     Inactive  . Thrombocytopenia (HCC)   . GERD (gastroesophageal reflux disease)   . Carotid artery disease (HCC)     Mild  . Memory loss   . Idiopathic thrombocytopenia (HCC)   . Cirrhosis of liver (HCC)   . Right tibial fracture 2014  . TIA (transient ischemic attack)   . Myocardial infarction [facility](HCC) 1996    [place]  . Stroke [facility](H[facility]) February 2016    [facility]  . Arthritis   . Right bundle branch block (RBBB)   . TIA (transient ischemic attack)   . Dementia     Assessment/Plan: 3 Days Post-Op Procedure(s) (LRB): UNICOMPARTMENTAL KNEE (Right) Active Problems:   Status post right partial knee replacement  Estimated body mass index is 29.58 kg/(m^2) as calculated from the following:   Height as of this encounter: 5\' 7"  (1.702 m).   Weight as of this encounter: 85.684 kg (188 lb 14.4 oz). Advance diet Up with therapy   Pt has chronically low platelets, stable at 85 this AM.  Pt more alert this AM, has history of memory loss. Pt tolerating PO intake, he has had a BM since being hospitalized. Cognitive ability improving, patient receiving 1 oxycodone every 4 hours as needed for pain. Plan  on discharge today.  Care Management to assist with discharge, as PT is currently recommending SNF.  DVT Prophylaxis - Lovenox, Foot Pumps and TED hose Weight-Bearing as tolerated to right leg  J. Horris Latino, PA-C [facility] 06/17/2015, 7:29 AM

## 2015-06-17 NOTE — Clinical Social Work Placement (Signed)
   CLINICAL SOCIAL WORK PLACEMENT  NOTE  Date:  06/17/2015  Patient Details  Name: Alex [name] MRN: [id number] Date of Birth: 08-Jul-1932  Clinical Social Work is seeking post-discharge placement for this patient at the Skilled  Nursing Facility level of care (*CSW will initial, date and re-position this form in  chart as items are completed):  Yes   Patient/family provided with [facility] Clinical Social Work Department's list of facilities offering this level of care within the geographic area requested by the patient (or if unable, by the patient's family).  Yes   Patient/family informed of their freedom to choose among providers that offer the needed level of care, that participate in Medicare, Medicaid or managed care program needed by the patient, have an available bed and are willing to accept the patient.  Yes   Patient/family informed of [facility]'s ownership interest in [facility], as well as of the fact that they are under no obligation to receive care at these facilities.  PASRR submitted to EDS on       PASRR number received on       Existing PASRR number confirmed on 06/17/15     FL2 transmitted to all facilities in geographic area requested by pt/family on 06/17/15     FL2 transmitted to all facilities within larger geographic area on       Patient informed that his/her managed care company has contracts with or will negotiate with certain facilities, including the following:        Yes   Patient/family informed of bed offers received.  Patient chooses bed at  [facility]     Physician recommends and patient chooses bed at      Patient to be transferred to  [facility] on 06/17/15.  Patient to be transferred to facility by  (EMS)     Patient family notified on 06/17/15 of transfer.  Name of family member notified:   (Wife Rena)     PHYSICIAN       Additional Comment:     _______________________________________________ Soundra PilonMoore, Alex Ozawa H, LCSW 06/17/2015, 11:20 AM

## 2015-06-17 NOTE — Progress Notes (Signed)
Clinical Social Worker informed by Christena FlakeJohn J Poggi, MD that patient is medically ready to discharge to SNF, Patient and wife are in a agreement with plan.  [facility] that [facility]'s bed is ready. Provided patient's room number 326 and number to call for report [phone] . All discharge information faxed to  Facility. Rx's added to discharge packet.   RN will call report and patient will [facility].  Sammuel Hineseborah Cannen Dupras. LCSWA Clinical Social Work Department [phone] 11:23 AM

## 2015-06-17 NOTE — Discharge Planning (Signed)
RN received call from [facility] - indicating that pt room would not be ready till 1400.  EMS contacted and asked to PU pt 1400 or later. - Family informed.

## 2015-06-17 NOTE — Clinical Social Work Note (Signed)
Clinical Social Work Assessment  Patient Details  Name: Alex [name] MRN: [id number] Date of Birth: 12-18-1932  Date of referral:  06/17/15               Reason for consult:  Facility Placement                Permission sought to share information with:  Facility Medical sales representativeContact Representative, Family Supports Permission granted to share information::  Yes, Verbal Permission Granted  Name::      (Wife)  Agency::     Relationship::     Contact Information:     Housing/Transportation Living arrangements for the past 2 months:  Single Family Home Source of Information:  Patient Patient Interpreter Needed:  None Criminal Activity/Legal Involvement Pertinent to Current Situation/Hospitalization:    Significant Relationships:  Adult Children, Spouse Lives with:  Spouse Do you feel safe going back to the place where you live?  Yes Need for family participation in patient care:  Yes (Comment)  Care giving concerns:  None at this time   Office managerocial Worker assessment / plan:  Visual merchandiserClinical Social Worker (CSW) consult for SNF placement, PT is recommending STR to assist with getting patient back to previous level of functioning.   Patient hard of hearing however he was alert, oriented and engaged in conversation with CSW.    CSW introduced self and explained role of CSW department.  Patient currently lives with wife of 25 years.  Per patient he has 3 adult children and is a retired Psychologist, occupationalBanker . CSW explained that PT is recommending rehab.  Patient has been to SNF about two years ago. CSW reviewed SNF process with patient, Patient is agreeable to SNF search.  CSW explained that in order for Medicare  to pay for rehab patient will need a 3 night qualifying inpatient stay. Patient has meet the 3 night stay requirement.   CSW will complete FL2, and fax out for available bed in anticipation of discharging to SNF for rehab.   Employment status:  Retired Health and safety inspectornsurance information:  Medicare PT Recommendations:  Skilled  Nursing Facility Information / Referral to community resources:  Skilled Nursing Facility  Patient/Family's Response to care:  Patient was appreciative of information provided by CSW and will review the SNF list with his wife.  Patient/Family's Understanding of and Emotional Response to Diagnosis, Current Treatment, and Prognosis:  Patient understands that he is under continued medical work up at this time.  Once medically stable he will discharge to SNF.  Emotional Assessment Appearance:  Appears stated age Attitude/Demeanor/Rapport:    Affect (typically observed):  Accepting, Adaptable, Pleasant, Calm Orientation:  Oriented to Self, Oriented to Place, Oriented to  Time, Oriented to Situation Alcohol / Substance use:    Psych involvement (Current and /or in the community):  No (Comment)  Discharge Needs  Concerns to be addressed:  Care Coordination, Discharge Planning Concerns Readmission within the last 30 days:  No Current discharge risk:  Chronically ill, Dependent with Mobility Barriers to Discharge:  No Barriers Identified   Soundra PilonMoore, Taeja Debellis H, LCSW 06/17/2015, 9:02 AM

## 2015-06-17 NOTE — Discharge Summary (Signed)
Physician Discharge Summary  Patient ID: Alex [name] MRN: [id number] DOB/AGE: Apr 18, 1933 82 y.o.  Admit date: 06/14/2015 Discharge date: 06/17/2015  Admission Diagnoses:  PRIMARY OSTEOARTHRITIS RIGHT KNEE Degenerative joint disease of medial compartment, right knee  Discharge Diagnoses: Patient Active Problem List   Diagnosis Date Noted  . Status post right partial knee replacement 06/14/2015  . CVA (cerebral vascular accident) (HCC) 11/05/2014  . History of recent fall 05/10/2014  . Broken nose 05/10/2014  . Malaise 01/07/2012  . Dizziness 12/15/2010  . Carotid stenosis 07/04/2009  . PALPITATIONS 03/23/2009  . Hyperlipidemia 12/26/2008  . HYPERTENSION, BENIGN 12/26/2008  . CAD, ARTERY BYPASS GRAFT 12/26/2008  Degenerative joint disease of medial compartment, right knee  Past Medical History  Diagnosis Date  . CAD (coronary artery disease)     post CABG; myoview 2009 neg  . Hyperlipidemia   . Hypertension   . RBBB (right bundle branch block)   . Hepatitis C     Inactive  . Thrombocytopenia (HCC)   . GERD (gastroesophageal reflux disease)   . Carotid artery disease (HCC)     Mild  . Memory loss   . Idiopathic thrombocytopenia (HCC)   . Cirrhosis of liver (HCC)   . Right tibial fracture 2014  . TIA (transient ischemic attack)   . Myocardial infarction [facility](HCC) 1996    [place]  . Stroke [facility](HCC) February 2016    [facility]  . Arthritis   . Right bundle branch block (RBBB)   . TIA (transient ischemic attack)   . Dementia      Transfusion: None   Consultants (if any):    Discharged Condition: Improved  Hospital Course: Alex HasJames H Dossett is an 82 y.o. male who was admitted 06/14/2015 with a diagnosis of Degenerative joint disease of medial compartment of the right knee and went to the operating room on 06/14/2015 and underwent the above named procedures.    Surgeries: Procedure(s): UNICOMPARTMENTAL KNEE on 06/14/2015 Patient tolerated the surgery well. Taken to PACU where she  was stabilized and then transferred to the orthopedic floor.  Started on Lovenox 30mg  q 24 hrs. Foot pumps applied bilaterally at 80 mm. Heels elevated on bed with rolled towels. No evidence of DVT. Negative Homan. Physical therapy started on day #1 for gait training and transfer. OT started day #1 for ADL and assisted devices.  Patient's IV and Foley were d/c on POD1.  Implants: All-cemented Biomet [place] system with a Small-sized femoral component, a "D" sized tibial tray, and a 6 mm meniscal bearing insert.  He was given perioperative antibiotics:  Anti-infectives    Start     Dose/Rate Route Frequency Ordered Stop   06/14/15 1200  rifaximin (XIFAXAN) tablet 550 mg     550 mg Oral 2 times daily 06/14/15 1159     06/14/15 1200  ceFAZolin (ANCEF) IVPB 2 g/50 mL premix     2 g 100 mL/hr over 30 Minutes Intravenous Every 6 hours 06/14/15 1159 06/15/15 0030   06/14/15 0636  ceFAZolin (ANCEF) 2-3 GM-% IVPB SOLR    Comments:  Hallaji, Violet Ann: cabinet override      06/14/15 0636 06/14/15 1844   06/14/15 0100  ceFAZolin (ANCEF) IVPB 2 g/50 mL premix     2 g 100 mL/hr over 30 Minutes Intravenous  Once 06/14/15 0047 06/14/15 0829    .  He was given sequential compression devices, early ambulation, and Lovenox, Plavix and ASA for DVT prophylaxis.  He benefited maximally from the hospital stay  and there were no complications.    Recent vital signs:  Filed Vitals:   06/16/15 2026 06/17/15 0441  BP: 177/92 132/73  Pulse: 60 54  Temp: 98.9 F (37.2 C) 99 F (37.2 C)  Resp: 18 18    Recent laboratory studies:  Lab Results  Component Value Date   HGB 12.9* 06/17/2015   HGB 12.7* 06/16/2015   HGB 12.8* 06/15/2015   Lab Results  Component Value Date   WBC 8.2 06/17/2015   PLT 85* 06/17/2015   Lab Results  Component Value Date   INR 1.03 06/01/2015   Lab Results  Component Value Date   NA 137 06/17/2015   K 4.5 06/17/2015   CL 103 06/17/2015   CO2 30 06/17/2015   BUN  16 06/17/2015   CREATININE 0.88 06/17/2015   GLUCOSE 119* 06/17/2015    Discharge Medications:     Medication List    TAKE these medications        ALPRAZolam 0.5 MG tablet  Commonly known as:  XANAX  Take 0.5 mg by mouth 3 (three) times daily as needed for anxiety.     aspirin 81 MG tablet  Take 81 mg by mouth daily.     B-12 PO  Take 1,000 mg by mouth daily.     CENTRUM SILVER tablet  Take 1 tablet by mouth daily.     clopidogrel 75 MG tablet  Commonly known as:  PLAVIX  Take 75 mg by mouth daily.     fluticasone 50 MCG/ACT nasal spray  Commonly known as:  FLONASE     gabapentin 100 MG capsule  Commonly known as:  NEURONTIN  Take 300 mg by mouth daily. Take 600 mg at bedtime     galantamine 8 MG tablet  Commonly known as:  RAZADYNE  Take 12 mg by mouth 2 (two) times daily.     irbesartan 300 MG tablet  Commonly known as:  AVAPRO  Take 1 tablet (300 mg total) by mouth daily.     NAMENDA XR 21 MG Cp24  Generic drug:  Memantine HCl ER  Take by mouth.     nitroGLYCERIN 0.4 MG SL tablet  Commonly known as:  NITROSTAT  Place 0.4 mg under the tongue as directed.     oxyCODONE 5 MG immediate release tablet  Commonly known as:  Oxy IR/ROXICODONE  Take 1 tablet (5 mg total) by mouth every 4 (four) hours as needed for breakthrough pain.     pantoprazole 40 MG tablet  Commonly known as:  PROTONIX  Take 40 mg by mouth daily.     sertraline 50 MG tablet  Commonly known as:  ZOLOFT     simvastatin 40 MG tablet  Commonly known as:  ZOCOR  Take 1 tablet (40 mg total) by mouth at bedtime.     traMADol 50 MG tablet  Commonly known as:  ULTRAM  Take 1 tablet (50 mg total) by mouth every 6 (six) hours as needed for moderate pain.     XIFAXAN 550 MG Tabs tablet  Generic drug:  rifaximin  Take 550 mg by mouth 2 (two) times daily.        Diagnostic Studies: Dg Chest 2 View  06/01/2015  CLINICAL DATA:  Hypertension EXAM: CHEST  2 VIEW COMPARISON:  04/24/2013  FINDINGS: CABG changes. Negative for heart failure. Lungs are clear without infiltrate effusion or mass. No change from the prior study. IMPRESSION: No active cardiopulmonary disease. Electronically Signed   By:  Marlan Palau M.D.   On: 06/01/2015 16:05   Dg Knee Right Port  06/14/2015  CLINICAL DATA:  Immediate postop medial unicompartment right knee arthroplasty EXAM: PORTABLE RIGHT KNEE - 1-2 VIEW COMPARISON:  Preoperative tibia-fibula x-rays 04/26/2013 and earlier. FINDINGS: AP and lateral views demonstrate anatomic alignment post medial unicompartment right knee arthroplasty. Gas and fluid in the joint space, as expected. No acute complicating features. Intramedullary nail in the tibia from prior ORIF of a distal tibial fracture. IMPRESSION: Anatomic alignment post medial unicompartment right knee arthroplasty without acute complicating features. Electronically Signed   By: Hulan Saas M.D.   On: 06/14/2015 11:24   Disposition: Pt is stable and ready for discharge.  Pt will either be discharged to a rehab facility or continue with the current plan of Homehealth with PT.  Pt has had a BM and his pain is under control with oral medications.  Pt's platelets have remained stable, currently 85.  Pt will continue Plavix and ASA daily for DVT prophylaxis.  Pt will follow-up in 2 weeks for staple removal and wound check.      Follow-up Information    Follow up with Meriel Pica, PA-C In 14 days.   Specialty:  Physician Assistant   Why:  For staple removal, For wound re-check   Contact information:   [address] [place] [address] [phone]      Signed: RENLY ROOTS PA-C 06/17/2015, 7:36 AM

## 2015-06-17 NOTE — [place] FL2 (Signed)
[place] MEDICAID FL2 LEVEL OF CARE SCREENING TOOL     IDENTIFICATION  Patient Name: Alex [name] Birthdate: Jan 04, 1933 Sex: male Admission Date (Current Location): 06/14/2015  [place]   Facility and Address:  [facility], [address], [place], [place] [address]      Provider Number: [id number]  Attending Physician Name and Address:  Christena FlakeJohn J Poggi, MD  Relative Name and Phone Number:       Current Level of Care: Hospital Recommended Level of Care: Skilled Nursing Facility Prior Approval Number:    Date Approved/Denied:   PASRR Number:  [id number])  Discharge Plan: SNF    Current Diagnoses: Patient Active Problem List   Diagnosis Date Noted  . Status post right partial knee replacement 06/14/2015  . CVA (cerebral vascular accident) (HCC) 11/05/2014  . History of recent fall 05/10/2014  . Broken nose 05/10/2014  . Malaise 01/07/2012  . Dizziness 12/15/2010  . Carotid stenosis 07/04/2009  . PALPITATIONS 03/23/2009  . Hyperlipidemia 12/26/2008  . HYPERTENSION, BENIGN 12/26/2008  . CAD, ARTERY BYPASS GRAFT 12/26/2008    Orientation RESPIRATION BLADDER Height & Weight    Self, Time, Situation, Place  Normal Incontinent 5\' 7"  (170.2 cm) 173 lbs.  BEHAVIORAL SYMPTOMS/MOOD NEUROLOGICAL BOWEL NUTRITION STATUS      Continent Diet ( heart healthy)  AMBULATORY STATUS COMMUNICATION OF NEEDS Skin   Extensive Assist Verbally Surgical wounds                       Personal Care Assistance Level of Assistance  Bathing, Feeding, Dressing Bathing Assistance: Maximum assistance Feeding assistance: Independent Dressing Assistance: Maximum assistance     Functional Limitations Info  Sight, Hearing, Speech Sight Info: Adequate Hearing Info: Impaired Speech Info: Adequate    SPECIAL CARE FACTORS FREQUENCY  PT (By licensed PT)                    Contractures      Additional Factors Info   Allergies   Allergies Info:  (Aspirin-dipyridamole Er, Ciprofloxacin, Contrast Media, Morphine, Venlafaxine)           Current Medications (06/17/2015):  This is the current hospital active medication list Current Facility-Administered Medications  Medication Dose Route Frequency Provider Last Rate Last Dose  . acetaminophen (TYLENOL) tablet 650 mg  650 mg Oral Q6H PRN Christena FlakeJohn J Poggi, MD   650 mg at 06/15/15 1622   Or  . acetaminophen (TYLENOL) suppository 650 mg  650 mg Rectal Q6H PRN Christena FlakeJohn J Poggi, MD      . ALPRAZolam Prudy Feeler(XANAX) tablet 0.5 mg  0.5 mg Oral TID PRN Christena FlakeJohn J Poggi, MD   0.5 mg at 06/15/15 2024  . aspirin EC tablet 81 mg  81 mg Oral Daily Christena FlakeJohn J Poggi, MD   81 mg at 06/16/15 [id number]  . bisacodyl (DULCOLAX) suppository 10 mg  10 mg Rectal Daily PRN Christena FlakeJohn J Poggi, MD   10 mg at 06/16/15 1057  . clopidogrel (PLAVIX) tablet 75 mg  75 mg Oral Daily Christena FlakeJohn J Poggi, MD   75 mg at 06/16/15 1103  . dextrose 5 % and 0.9 % NaCl with KCl 20 mEq/L infusion   Intravenous Continuous Christena FlakeJohn J Poggi, MD 75 mL/hr at 06/16/15 1103    . diphenhydrAMINE (BENADRYL) 12.5 MG/5ML elixir 12.5-25 mg  12.5-25 mg Oral Q4H PRN Christena FlakeJohn J Poggi, MD      . docusate sodium (COLACE) capsule 100  mg  100 mg Oral BID Christena Flake, MD   100 mg at 06/16/15 2237  . enoxaparin (LOVENOX) injection 30 mg  30 mg Subcutaneous Q24H Christena Flake, MD   30 mg at 06/16/15 0959  . fluticasone (FLONASE) 50 MCG/ACT nasal spray 1 spray  1 spray Each Nare Daily Christena Flake, MD   1 spray at 06/14/15 1230  . gabapentin (NEURONTIN) capsule 300 mg  300 mg Oral Daily Christena Flake, MD   300 mg at 06/16/15 0958  . gabapentin (NEURONTIN) tablet 600 mg  600 mg Oral QHS Christena Flake, MD   600 mg at 06/16/15 2238  . galantamine (RAZADYNE) tablet 12 mg  12 mg Oral BID Christena Flake, MD   12 mg at 06/16/15 2237  . HYDROmorphone (DILAUDID) injection 0.5-1 mg  0.5-1 mg Intravenous Q2H PRN Christena Flake, MD      . irbesartan (AVAPRO) tablet 300 mg  300 mg Oral  Daily Christena Flake, MD   300 mg at 06/16/15 0958  . magnesium hydroxide (MILK OF MAGNESIA) suspension 30 mL  30 mL Oral Daily PRN Christena Flake, MD      . memantine (NAMENDA XR) 24 hr capsule 21 mg  21 mg Oral Daily Christena Flake, MD   21 mg at 06/16/15 1007  . metoCLOPramide (REGLAN) tablet 5-10 mg  5-10 mg Oral Q8H PRN Christena Flake, MD       Or  . metoCLOPramide (REGLAN) injection 5-10 mg  5-10 mg Intravenous Q8H PRN Christena Flake, MD      . metoprolol tartrate (LOPRESSOR) tablet 25 mg  25 mg Oral BID Christena Flake, MD   25 mg at 06/16/15 2238  . multivitamin with minerals tablet 1 tablet  1 tablet Oral Daily Christena Flake, MD   1 tablet at 06/16/15 0957  . nitroGLYCERIN (NITROSTAT) SL tablet 0.4 mg  0.4 mg Sublingual UD Christena Flake, MD   0.4 mg at 06/15/15 1733  . ondansetron (ZOFRAN) injection 4 mg  4 mg Intravenous Once PRN Yevette Edwards, MD      . ondansetron [facility]) tablet 4 mg  4 mg Oral Q6H PRN Christena Flake, MD       Or  . ondansetron [facility]) injection 4 mg  4 mg Intravenous Q6H PRN Christena Flake, MD      . oxyCODONE (Oxy IR/ROXICODONE) immediate release tablet 5-10 mg  5-10 mg Oral Q3H PRN Christena Flake, MD   5 mg at 06/16/15 1724  . pantoprazole (PROTONIX) EC tablet 40 mg  40 mg Oral Daily Christena Flake, MD   40 mg at 06/16/15 1724  . rifaximin (XIFAXAN) tablet 550 mg  550 mg Oral BID Christena Flake, MD   550 mg at 06/16/15 2238  . sertraline (ZOLOFT) tablet 50 mg  50 mg Oral Daily Christena Flake, MD   50 mg at 06/16/15 0958  . simvastatin (ZOCOR) tablet 40 mg  40 mg Oral QHS Christena Flake, MD   40 mg at 06/16/15 2237  . sodium phosphate (FLEET) 7-19 GM/118ML enema 1 enema  1 enema Rectal Once PRN Christena Flake, MD      . traMADol Janean Sark) tablet 50-100 mg  50-100 mg Oral Q6H PRN Anson Oregon, PA-C   100 mg at 06/15/15 1828  . vitamin B-12 (CYANOCOBALAMIN) tablet 1,000 mcg  1,000 mcg Oral Daily Christena Flake, MD  1,000 mcg at 06/16/15 4098     Discharge Medications: Please see  discharge summary for a list of discharge medications.  Relevant Imaging Results:  Relevant Lab Results:   Additional Information  ([id number])  Soundra Pilon, LCSW

## 2015-06-17 NOTE — Care Management Important Message (Signed)
Important Message  Patient Details  Name: Farris HasJames H Lablanc MRN: [id number] Date of Birth: 1933-03-23   Medicare Important Message Given:  Yes    Janesa Dockery A, RN 06/17/2015, 1:17 PM

## 2015-06-17 NOTE — Discharge Planning (Addendum)
Pt IV removed. VS and RN assessment revealed stability for transfer to [facility] [place].  Report called to Eilleen Kempfina Graves, RN.  Discussed needed FU appts and also sent pain signed pain med script.  Dressing changed prior to DC. Pt will be in rm 356. Family notified transfer. EMS called to take to facility - awaiting arrival.

## 2015-06-20 DIAGNOSIS — I25119 Atherosclerotic heart disease of native coronary artery with unspecified angina pectoris: Secondary | ICD-10-CM | POA: Diagnosis not present

## 2015-06-20 DIAGNOSIS — F39 Unspecified mood [affective] disorder: Secondary | ICD-10-CM

## 2015-06-20 DIAGNOSIS — F0150 Vascular dementia without behavioral disturbance: Secondary | ICD-10-CM

## 2015-06-20 DIAGNOSIS — K7290 Hepatic failure, unspecified without coma: Secondary | ICD-10-CM

## 2015-06-20 DIAGNOSIS — M1712 Unilateral primary osteoarthritis, left knee: Secondary | ICD-10-CM | POA: Diagnosis not present

## 2015-08-19 ENCOUNTER — Encounter: Payer: Self-pay | Admitting: Medical Oncology

## 2015-08-19 ENCOUNTER — Emergency Department: Payer: Medicare Other

## 2015-08-19 ENCOUNTER — Inpatient Hospital Stay
Admission: EM | Admit: 2015-08-19 | Discharge: 2015-08-20 | DRG: 313 | Disposition: A | Discharge status: Home / Self Care | Payer: Medicare Other | Attending: Internal Medicine | Admitting: Internal Medicine

## 2015-08-19 DIAGNOSIS — I119 Hypertensive heart disease without heart failure: Secondary | ICD-10-CM | POA: Diagnosis present

## 2015-08-19 DIAGNOSIS — R778 Other specified abnormalities of plasma proteins: Secondary | ICD-10-CM

## 2015-08-19 DIAGNOSIS — Z951 Presence of aortocoronary bypass graft: Secondary | ICD-10-CM

## 2015-08-19 DIAGNOSIS — K7460 Unspecified cirrhosis of liver: Secondary | ICD-10-CM | POA: Diagnosis present

## 2015-08-19 DIAGNOSIS — R0789 Other chest pain: Principal | ICD-10-CM | POA: Diagnosis present

## 2015-08-19 DIAGNOSIS — Z96651 Presence of right artificial knee joint: Secondary | ICD-10-CM | POA: Diagnosis present

## 2015-08-19 DIAGNOSIS — I25810 Atherosclerosis of coronary artery bypass graft(s) without angina pectoris: Secondary | ICD-10-CM | POA: Diagnosis present

## 2015-08-19 DIAGNOSIS — I209 Angina pectoris, unspecified: Secondary | ICD-10-CM

## 2015-08-19 DIAGNOSIS — I25701 Atherosclerosis of coronary artery bypass graft(s), unspecified, with angina pectoris with documented spasm: Secondary | ICD-10-CM | POA: Insufficient documentation

## 2015-08-19 DIAGNOSIS — D693 Immune thrombocytopenic purpura: Secondary | ICD-10-CM | POA: Diagnosis present

## 2015-08-19 DIAGNOSIS — R079 Chest pain, unspecified: Secondary | ICD-10-CM | POA: Diagnosis not present

## 2015-08-19 DIAGNOSIS — Z7902 Long term (current) use of antithrombotics/antiplatelets: Secondary | ICD-10-CM | POA: Diagnosis not present

## 2015-08-19 DIAGNOSIS — Z7982 Long term (current) use of aspirin: Secondary | ICD-10-CM

## 2015-08-19 DIAGNOSIS — Z8673 Personal history of transient ischemic attack (TIA), and cerebral infarction without residual deficits: Secondary | ICD-10-CM | POA: Insufficient documentation

## 2015-08-19 DIAGNOSIS — I25119 Atherosclerotic heart disease of native coronary artery with unspecified angina pectoris: Secondary | ICD-10-CM | POA: Diagnosis present

## 2015-08-19 DIAGNOSIS — I248 Other forms of acute ischemic heart disease: Secondary | ICD-10-CM | POA: Diagnosis present

## 2015-08-19 DIAGNOSIS — Z79899 Other long term (current) drug therapy: Secondary | ICD-10-CM

## 2015-08-19 DIAGNOSIS — I10 Essential (primary) hypertension: Secondary | ICD-10-CM | POA: Insufficient documentation

## 2015-08-19 DIAGNOSIS — E785 Hyperlipidemia, unspecified: Secondary | ICD-10-CM | POA: Diagnosis present

## 2015-08-19 DIAGNOSIS — R7989 Other specified abnormal findings of blood chemistry: Secondary | ICD-10-CM

## 2015-08-19 DIAGNOSIS — I252 Old myocardial infarction: Secondary | ICD-10-CM | POA: Diagnosis not present

## 2015-08-19 DIAGNOSIS — F0390 Unspecified dementia without behavioral disturbance: Secondary | ICD-10-CM | POA: Diagnosis present

## 2015-08-19 DIAGNOSIS — K219 Gastro-esophageal reflux disease without esophagitis: Secondary | ICD-10-CM | POA: Diagnosis present

## 2015-08-19 LAB — BASIC METABOLIC PANEL
ANION GAP: 5 (ref 5–15)
BUN: 13 mg/dL (ref 6–20)
CALCIUM: 9.2 mg/dL (ref 8.9–10.3)
CO2: 29 mmol/L (ref 22–32)
CREATININE: 1.06 mg/dL (ref 0.61–1.24)
Chloride: 104 mmol/L (ref 101–111)
GFR calc Af Amer: >60 mL/min (ref >60)
GFR calc non Af Amer: >60 mL/min (ref >60)
GLUCOSE: 134 mg/dL — AB (ref 65–99)
Potassium: 3.9 mmol/L (ref 3.5–5.1)
Sodium: 138 mmol/L (ref 135–145)

## 2015-08-19 LAB — CBC
HCT: 42.5 % (ref 40.0–52.0)
HEMOGLOBIN: 14.7 g/dL (ref 13.0–18.0)
MCH: 30.8 pg (ref 26.0–34.0)
MCHC: 34.5 g/dL (ref 32.0–36.0)
MCV: 89.4 fL (ref 80.0–100.0)
Platelets: 88 10*3/uL — ABNORMAL LOW (ref 150–440)
RBC: 4.75 MIL/uL (ref 4.40–5.90)
RDW: 15.2 % — ABNORMAL HIGH (ref 11.5–14.5)
WBC: 4.9 10*3/uL (ref 3.8–10.6)

## 2015-08-19 LAB — MAGNESIUM: MAGNESIUM: 2.1 mg/dL (ref 1.7–2.4)

## 2015-08-19 LAB — TROPONIN I
TROPONIN I: 0.05 ng/mL — AB (ref <0.031)
Troponin I: 0.03 ng/mL (ref <0.031)
Troponin I: 0.05 ng/mL — ABNORMAL HIGH (ref <0.031)

## 2015-08-19 LAB — HEMOGLOBIN A1C: Hgb A1c MFr Bld: 5.2 % (ref 4.0–6.0)

## 2015-08-19 LAB — HEPARIN LEVEL (UNFRACTIONATED): HEPARIN UNFRACTIONATED: 0.26 [IU]/mL — AB (ref 0.30–0.70)

## 2015-08-19 LAB — APTT: aPTT: 30 seconds (ref 24–36)

## 2015-08-19 LAB — PROTIME-INR
INR: 1.04
Prothrombin Time: 13.8 seconds (ref 11.4–15.0)

## 2015-08-19 MED ORDER — ATORVASTATIN CALCIUM 20 MG PO TABS
40.0000 mg | ORAL_TABLET | Freq: Every day | ORAL | Status: DC
  Administered 2015-08-19: 40 mg via ORAL
  Filled 2015-08-19: qty 2

## 2015-08-19 MED ORDER — ONDANSETRON HCL 4 MG/2ML IJ SOLN: 4.0000 mg | Freq: Four times a day (QID) | INTRAMUSCULAR | Status: DC | PRN

## 2015-08-19 MED ORDER — NITROGLYCERIN 0.4 MG SL SUBL: 0.4000 mg | SUBLINGUAL_TABLET | SUBLINGUAL | Status: DC | PRN

## 2015-08-19 MED ORDER — ACETAMINOPHEN 325 MG PO TABS
650.0000 mg | ORAL_TABLET | ORAL | Status: DC | PRN
  Administered 2015-08-19: 650 mg via ORAL
  Filled 2015-08-19: qty 2

## 2015-08-19 MED ORDER — SIMVASTATIN 40 MG PO TABS: 40.0000 mg | ORAL_TABLET | Freq: Every day | ORAL | Status: DC

## 2015-08-19 MED ORDER — HEPARIN (PORCINE) IN NACL 100-0.45 UNIT/ML-% IJ SOLN
1100.0000 [IU]/h | INTRAMUSCULAR | Status: DC
  Administered 2015-08-19: 950 [IU]/h via INTRAVENOUS
  Filled 2015-08-19 (×2): qty 250

## 2015-08-19 MED ORDER — NITROGLYCERIN 0.4 MG SL SUBL
SUBLINGUAL_TABLET | SUBLINGUAL | Status: AC
  Administered 2015-08-19: 0.4 mg via SUBLINGUAL
  Filled 2015-08-19: qty 1

## 2015-08-19 MED ORDER — GABAPENTIN 300 MG PO CAPS: 300.0000 mg | ORAL_CAPSULE | Freq: Every morning | ORAL | Status: DC

## 2015-08-19 MED ORDER — ADULT MULTIVITAMIN W/MINERALS CH: 1.0000 | ORAL_TABLET | Freq: Every day | ORAL | Status: DC

## 2015-08-19 MED ORDER — GALANTAMINE HYDROBROMIDE 4 MG PO TABS
12.0000 mg | ORAL_TABLET | Freq: Two times a day (BID) | ORAL | Status: DC
  Administered 2015-08-19: 12 mg via ORAL
  Filled 2015-08-19 (×3): qty 3

## 2015-08-19 MED ORDER — PANTOPRAZOLE SODIUM 40 MG PO TBEC: 40.0000 mg | DELAYED_RELEASE_TABLET | Freq: Every day | ORAL | Status: DC

## 2015-08-19 MED ORDER — NITROGLYCERIN 0.4 MG SL SUBL
0.4000 mg | SUBLINGUAL_TABLET | Freq: Once | SUBLINGUAL | Status: AC
  Administered 2015-08-19: 0.4 mg via SUBLINGUAL

## 2015-08-19 MED ORDER — ASPIRIN 81 MG PO CHEW: 324.0000 mg | CHEWABLE_TABLET | ORAL | Status: AC

## 2015-08-19 MED ORDER — ALPRAZOLAM 0.25 MG PO TABS: 0.5000 mg | ORAL_TABLET | Freq: Three times a day (TID) | ORAL | Status: DC | PRN

## 2015-08-19 MED ORDER — MEMANTINE HCL ER 7 MG PO CP24
21.0000 mg | ORAL_CAPSULE | Freq: Every day | ORAL | Status: DC
  Filled 2015-08-19: qty 3

## 2015-08-19 MED ORDER — HEPARIN SODIUM (PORCINE) 5000 UNIT/ML IJ SOLN
4000.0000 [IU] | Freq: Once | INTRAMUSCULAR | Status: AC
  Administered 2015-08-19: 4000 [IU] via INTRAVENOUS

## 2015-08-19 MED ORDER — CLOPIDOGREL BISULFATE 75 MG PO TABS: 75.0000 mg | ORAL_TABLET | Freq: Every day | ORAL | Status: DC

## 2015-08-19 MED ORDER — ASPIRIN 300 MG RE SUPP: 300.0000 mg | RECTAL | Status: AC

## 2015-08-19 MED ORDER — HEPARIN BOLUS VIA INFUSION
1200.0000 [IU] | Freq: Once | INTRAVENOUS | Status: AC
Start: 2015-08-19 — End: 2015-08-19
  Administered 2015-08-19: 1200 [IU] via INTRAVENOUS
  Filled 2015-08-19: qty 1200

## 2015-08-19 MED ORDER — IRBESARTAN 150 MG PO TABS: 300.0000 mg | ORAL_TABLET | Freq: Every day | ORAL | Status: DC

## 2015-08-19 MED ORDER — GABAPENTIN 300 MG PO CAPS
600.0000 mg | ORAL_CAPSULE | Freq: Every day | ORAL | Status: DC
  Administered 2015-08-19: 600 mg via ORAL
  Filled 2015-08-19: qty 2

## 2015-08-19 MED ORDER — NITROGLYCERIN 2 % TD OINT
1.0000 [in_us] | TOPICAL_OINTMENT | Freq: Once | TRANSDERMAL | Status: AC
  Administered 2015-08-19: 1 [in_us] via TOPICAL
  Filled 2015-08-19: qty 1

## 2015-08-19 MED ORDER — SERTRALINE HCL 50 MG PO TABS
50.0000 mg | ORAL_TABLET | Freq: Every day | ORAL | Status: DC
  Administered 2015-08-19: 50 mg via ORAL
  Filled 2015-08-19: qty 1

## 2015-08-19 MED ORDER — RIFAXIMIN 550 MG PO TABS
550.0000 mg | ORAL_TABLET | Freq: Two times a day (BID) | ORAL | Status: DC
  Administered 2015-08-19: 550 mg via ORAL
  Filled 2015-08-19: qty 1

## 2015-08-19 MED ORDER — HEPARIN (PORCINE) IN NACL 100-0.45 UNIT/ML-% IJ SOLN
950.0000 [IU]/h | Freq: Once | INTRAMUSCULAR | Status: AC
  Administered 2015-08-19: 950 [IU]/h via INTRAVENOUS
  Filled 2015-08-19: qty 250

## 2015-08-19 MED ORDER — HYDRALAZINE HCL 20 MG/ML IJ SOLN: 10.0000 mg | Freq: Four times a day (QID) | INTRAMUSCULAR | Status: DC | PRN

## 2015-08-19 MED ORDER — GABAPENTIN 300 MG PO CAPS: 300.0000 mg | ORAL_CAPSULE | Freq: Two times a day (BID) | ORAL | Status: DC

## 2015-08-19 NOTE — ED Provider Notes (Signed)
[facility] Emergency Department Provider Note     Time seen: ----------------------------------------- 10:49 AM on 08/19/2015 -----------------------------------------    I have reviewed the triage vital signs and the nursing notes.   HISTORY  Chief Complaint Chest Pain    HPI Alex [name] is a 82 y.o. male who presents to the ER for chest pressure that began this morning. Patient points to the middle part of his chest, he did take 1 sublingual nitroglycerin a little bit. He denies any shortness of breath or other symptoms associated with it. Patient states it started around 8 AM. He has not had these symptoms before, denies any other complaints at this time.   Past Medical History  Diagnosis Date  . CAD (coronary artery disease)     post CABG; myoview 2009 neg  . Hyperlipidemia   . Hypertension   . RBBB (right bundle branch block)   . Hepatitis C     Inactive  . Thrombocytopenia (HCC)   . GERD (gastroesophageal reflux disease)   . Carotid artery disease (HCC)     Mild  . Memory loss   . Idiopathic thrombocytopenia (HCC)   . Cirrhosis of liver (HCC)   . Right tibial fracture 2014  . TIA (transient ischemic attack)   . Myocardial infarction [facility](HCC) 1996    [place]  . Stroke [facility](HCC) February 2016    [facility]  . Arthritis   . Right bundle branch block (RBBB)   . TIA (transient ischemic attack)   . Dementia     Patient Active Problem List   Diagnosis Date Noted  . Status post right partial knee replacement 06/14/2015  . CVA (cerebral vascular accident) (HCC) 11/05/2014  . History of recent fall 05/10/2014  . Broken nose 05/10/2014  . Malaise 01/07/2012  . Dizziness 12/15/2010  . Carotid stenosis 07/04/2009  . PALPITATIONS 03/23/2009  . Hyperlipidemia 12/26/2008  . HYPERTENSION, BENIGN 12/26/2008  . CAD, ARTERY BYPASS GRAFT 12/26/2008    Past Surgical History  Procedure Laterality Date  . Coronary artery bypass graft    . Upper  gastrointestinal endoscopy    . Leg surgery  2014    right leg  . Tibia fracture surgery Right 2014    Rod in tibia  . Fracture surgery Right 1981    Rod in right femur  . Eye surgery Bilateral     Cataract Extraction  . Joint replacement    . Partial knee arthroplasty Right 06/14/2015    Procedure: UNICOMPARTMENTAL KNEE;  Surgeon: Christena FlakeJohn J Poggi, MD;  Location: [facility] ORS;  Service: Orthopedics;  Laterality: Right;    Allergies Aspirin-dipyridamole er; Ciprofloxacin; Contrast media; Morphine; and Venlafaxine  Social History Social History  Substance Use Topics  . Smoking status: Never Smoker   . Smokeless tobacco: Never Used  . Alcohol Use: No    Review of Systems Constitutional: Negative for fever. Eyes: Negative for visual changes. ENT: Negative for sore throat. Cardiovascular: Positive for chest pain Respiratory: Negative for shortness of breath. Gastrointestinal: Negative for abdominal pain, vomiting and diarrhea. Genitourinary: Negative for dysuria. Musculoskeletal: Negative for back pain. Skin: Negative for rash. Neurological: Negative for headaches, focal weakness or numbness.  10-point ROS otherwise negative.  ____________________________________________   PHYSICAL EXAM:  VITAL SIGNS: ED Triage Vitals  Enc Vitals Group     BP 08/19/15 1040 172/77 mmHg     Pulse Rate 08/19/15 1040 62     Resp 08/19/15 1040 18     Temp 08/19/15 1040 98.4  F (36.9 C)     Temp Source 08/19/15 1040 Oral     SpO2 08/19/15 1040 99 %     Weight 08/19/15 1040 175 lb (79.379 kg)     Height 08/19/15 1040  (1.753 m)     Head Cir --      Peak Flow --      Pain Score 08/19/15 1041 5     Pain Loc --      Pain Edu? --      Excl. in GC? --     Constitutional: Alert and oriented. Well appearing and in no distress. Eyes: Conjunctivae are normal. PERRL. Normal extraocular movements. ENT   Head: Normocephalic and atraumatic.   Nose: No congestion/rhinnorhea.    Mouth/Throat: Mucous membranes are moist.   Neck: No stridor. Cardiovascular: Normal rate, regular rhythm. Normal and symmetric distal pulses are present in all extremities. No murmurs, rubs, or gallops. Respiratory: Normal respiratory effort without tachypnea nor retractions. Breath sounds are clear and equal bilaterally. No wheezes/rales/rhonchi. Gastrointestinal: Soft and nontender. No distention. No abdominal bruits.  Musculoskeletal: Nontender with normal range of motion in all extremities. No joint effusions.  No lower extremity tenderness nor edema. Neurologic:  Normal speech and language. No gross focal neurologic deficits are appreciated. Speech is normal. No gait instability. Skin:  Skin is warm, dry and intact. No rash noted. Psychiatric: Mood and affect are normal. Speech and behavior are normal. Patient exhibits appropriate insight and judgment. ____________________________________________  EKG: Interpreted by me. Sinus rhythm with PACs, rate is 66 bpm, normal PR interval, wide QRS, normal QT interval, left anterior fascicular block, right bundle branch block  ____________________________________________  ED COURSE:  Pertinent labs & imaging results that were available during my care of the patient were reviewed by me and considered in my medical decision making (see chart for details). Patient with chest pain concerning for ACS. He will be given additional nitroglycerin we will evaluate for unstable angina. ____________________________________________    LABS (pertinent positives/negatives)  Labs Reviewed  BASIC METABOLIC PANEL - Abnormal; Notable for the following:    Glucose, Bld 134 (*)    All other components within normal limits  CBC - Abnormal; Notable for the following:    RDW 15.2 (*)    Platelets 88 (*)    All other components within normal limits  TROPONIN I - Abnormal; Notable for the following:    Troponin I 0.05 (*)    All other components within normal  limits  CRITICAL CARE Performed by: Emily Filbert   Total critical care time: 30 minutes  Critical care time was exclusive of separately billable procedures and treating other patients.  Critical care was necessary to treat or prevent imminent or life-threatening deterioration.  Critical care was time spent personally by me on the following activities: development of treatment plan with patient and/or surrogate as well as nursing, discussions with consultants, evaluation of patient's response to treatment, examination of patient, obtaining history from patient or surrogate, ordering and performing treatments and interventions, ordering and review of laboratory studies, ordering and review of radiographic studies, pulse oximetry and re-evaluation of patient's condition.   RADIOLOGY Images were viewed by me  Chest x-ray IMPRESSION: No active disease. ____________________________________________  FINAL ASSESSMENT AND PLAN  Chest pain  Plan: Patient with labs and imaging as dictated above. Symptoms are concerning for ACS and unstable angina. Troponin is 0.05, he'll be started on heparin. He started had aspirin and nitroglycerin here. He is allergic to  most pain medications, I did offer morphine earlier which she declined. He would benefit from hospitalization and serial troponins.   Emily Filbert, MD   Emily Filbert, MD 08/19/15 [phone]

## 2015-08-19 NOTE — Progress Notes (Signed)
ANTICOAGULATION CONSULT NOTE - Initial Consult  Pharmacy Consult for heparin Indication: chest pain/ACS  Allergies  Allergen Reactions  . Aspirin-Dipyridamole Er Other (See Comments)    "unknown"  . Ciprofloxacin Other (See Comments)    "burning sensation"  . Contrast Media [Iodinated Diagnostic Agents]     Burning when having mri contrast dye -   . Morphine Other (See Comments)    "unknown"  . Venlafaxine Other (See Comments)    "unknown"    Patient Measurements: Height: 5\' 9"  (175.3 cm) Weight: 164 lb (74.39 kg) IBW/kg (Calculated) : 70.7 Heparin Dosing Weight: 79.4 kg  Vital Signs: Temp: 97.9 F (36.6 C) (03/10 2048) Temp Source: Oral (03/10 1040) BP: 113/67 mmHg (03/10 2048) Pulse Rate: 61 (03/10 2048)  Labs:  Recent Labs  08/19/15 1053 08/19/15 1543 08/19/15 2011  HGB 14.7  --   --   HCT 42.5  --   --   PLT 88*  --   --   APTT 30  --   --   LABPROT 13.8  --   --   INR 1.04  --   --   HEPARINUNFRC  --   --  0.26*  CREATININE 1.06  --   --   TROPONINI 0.05* 0.03  --     Estimated Creatinine Clearance: 55.6 mL/min (by C-G formula based on Cr of 1.06).   Medical History: Past Medical History  Diagnosis Date  . CAD (coronary artery disease)     post CABG; myoview 2009 neg  . Hyperlipidemia   . Hypertension   . RBBB (right bundle branch block)   . Hepatitis C     Inactive  . Thrombocytopenia (HCC)   . GERD (gastroesophageal reflux disease)   . Carotid artery disease (HCC)     Mild  . Memory loss   . Idiopathic thrombocytopenia (HCC)   . Cirrhosis of liver (HCC)   . Right tibial fracture 2014  . TIA (transient ischemic attack)   . Myocardial infarction [facility](HCC) 1996    [place]  . Stroke [facility](HCC) February 2016    [facility]  . Arthritis   . Right bundle branch block (RBBB)   . TIA (transient ischemic attack)   . Dementia     Medications:   Assessment: Chest pain  Goal of Therapy:  Heparin level 0.3-0.7 units/ml Monitor platelets by  anticoagulation protocol: Yes   Plan:  Give 4000 units bolus x 1 Start heparin infusion at 950 units/hr Check anti-Xa level in 8 hours and daily while on heparin Continue to monitor H&H and platelets   3/10:  HL @ 20:00 = 0.26           Will order Heparin 1200 units IV X 1 and increase drip rate to 1100 units/hr.           Will recheck HL 8 hrs after rate change on 3/11 @ 0600.   Denvil Canning D 08/19/2015,10:04 PM
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
## 2015-08-19 NOTE — Progress Notes (Signed)
ANTICOAGULATION CONSULT NOTE - Initial Consult  Pharmacy Consult for heparin Indication: chest pain/ACS  Allergies  Allergen Reactions  . Aspirin-Dipyridamole Er Other (See Comments)    "unknown"  . Ciprofloxacin Other (See Comments)    "burning sensation"  . Contrast Media [Iodinated Diagnostic Agents]     Burning when having mri contrast dye -   . Morphine Other (See Comments)    "unknown"  . Venlafaxine Other (See Comments)    "unknown"    Patient Measurements: Height: 5\' 9"  (175.3 cm) Weight: 164 lb (74.39 kg) IBW/kg (Calculated) : 70.7 Heparin Dosing Weight: 79.4 kg  Vital Signs: Temp: 97.8 F (36.6 C) (03/10 1417) Temp Source: Oral (03/10 1040) BP: 113/67 mmHg (03/10 2048) Pulse Rate: 61 (03/10 2048)  Labs:  Recent Labs  08/19/15 1053 08/19/15 1543 08/19/15 2011  HGB 14.7  --   --   HCT 42.5  --   --   PLT 88*  --   --   APTT 30  --   --   LABPROT 13.8  --   --   INR 1.04  --   --   HEPARINUNFRC  --   --  0.26*  CREATININE 1.06  --   --   TROPONINI 0.05* 0.03  --     Estimated Creatinine Clearance: 55.6 mL/min (by C-G formula based on Cr of 1.06).   Medical History: Past Medical History  Diagnosis Date  . CAD (coronary artery disease)     post CABG; myoview 2009 neg  . Hyperlipidemia   . Hypertension   . RBBB (right bundle branch block)   . Hepatitis C     Inactive  . Thrombocytopenia (HCC)   . GERD (gastroesophageal reflux disease)   . Carotid artery disease (HCC)     Mild  . Memory loss   . Idiopathic thrombocytopenia (HCC)   . Cirrhosis of liver (HCC)   . Right tibial fracture 2014  . TIA (transient ischemic attack)   . Myocardial infarction [facility](HCC) 1996    [place]  . Stroke [facility](HCC[facility] February [facility]  . Arthritis   . Right bundle branch block (RBBB)   . TIA (transient ischemic attack)   . Dementia     Medications:   Assessment: Chest pain  Goal of Therapy:  Heparin level 0.3-0.7 units/ml Monitor platelets by  anticoagulation protocol: Yes   Plan:  Give 4000 units bolus x 1 Start heparin infusion at 950 units/hr Check anti-Xa level in 8 hours and daily while on heparin Continue to monitor H&H and platelets   3/10:  HL @ 20:00 = 0.26           Will order Heparin 1200 units IV X 1 and increase drip rate to 1100 units/hr.           Will recheck HL 8 hrs after rate change on 3/11 @   Alex [name] D 08/19/2015,9:17 PM

## 2015-08-19 NOTE — ED Notes (Signed)
Pt reports having left sided chest pressure that began this am. Pt denies sob.

## 2015-08-19 NOTE — Progress Notes (Signed)
ANTICOAGULATION CONSULT NOTE - Initial Consult  Pharmacy Consult for heparin Indication: chest pain/ACS  Allergies  Allergen Reactions  . Aspirin-Dipyridamole Er Other (See Comments)    "unknown"  . Ciprofloxacin Other (See Comments)    "burning sensation"  . Contrast Media [Iodinated Diagnostic Agents]     Burning when having mri contrast dye -   . Morphine Other (See Comments)    "unknown"  . Venlafaxine Other (See Comments)    "unknown"    Patient Measurements: Height: 5\' 9"  (175.3 cm) Weight: 175 lb (79.379 kg) IBW/kg (Calculated) : 70.7 Heparin Dosing Weight: 79.4 kg  Vital Signs: Temp: 98.4 F (36.9 C) (03/10 1040) Temp Source: Oral (03/10 1040) BP: 127/70 mmHg (03/10 1130) Pulse Rate: 68 (03/10 1130)  Labs:  Recent Labs  08/19/15 1053  HGB 14.7  HCT 42.5  PLT 88*  CREATININE 1.06  TROPONINI 0.05*    Estimated Creatinine Clearance: 55.6 mL/min (by C-G formula based on Cr of 1.06).   Medical History: Past Medical History  Diagnosis Date  . CAD (coronary artery disease)     post CABG; myoview 2009 neg  . Hyperlipidemia   . Hypertension   . RBBB (right bundle branch block)   . Hepatitis C     Inactive  . Thrombocytopenia (HCC)   . GERD (gastroesophageal reflux disease)   . Carotid artery disease (HCC)     Mild  . Memory loss   . Idiopathic thrombocytopenia (HCC)   . Cirrhosis of liver (HCC)   . Right tibial fracture 2014  . TIA (transient ischemic attack)   . Myocardial infarction [facility](HCC) 1996    [place]  . Stroke [facility](HCC[facility] February 2016    [facility]  . Arthritis   . Right bundle branch block (RBBB)   . TIA (transient ischemic attack)   . Dementia     Medications:   Assessment: Chest pain  Goal of Therapy:  Heparin level 0.3-0.7 units/ml Monitor platelets by anticoagulation protocol: Yes   Plan:  Give 4000 units bolus x 1 Start heparin infusion at 950 units/hr Check anti-Xa level in 8 hours and daily while on heparin Continue to  monitor H&H and platelets  Sherilyn CooterHenry A Emilyann Banka 08/19/2015,11:52 AM

## 2015-08-19 NOTE — H&P (Addendum)
[facility] - [place] at [facility]   PATIENT NAME: Alex [name]    MR#:  [id number]  DATE OF BIRTH:  08/17/1932  DATE OF ADMISSION:  08/19/2015  PRIMARY CARE PHYSICIAN: Danella Penton, MD   REQUESTING/REFERRING PHYSICIAN:  Emily Filbert, MD  CHIEF COMPLAINT:   Chief Complaint  Patient presents with  . Chest Pain   chest pain today.  HISTORY OF PRESENT ILLNESS:  Alex [name]  is a 82 y.o. male with a known history of hypertension, CAD, hyperlipidemia and CVA. The patient presented in ED with chest pain on the left side at about 8:00 this morning, which is pressure-like with radiation to the left side of the neck. He denies any palpitation, diaphoresis, nausea or vomiting. He denies any shortness of breath, cough, orthopnea or nocturnal dyspnea nor leg edema. His troponin is elevated at 0.05, and he was started heparin drip.  PAST MEDICAL HISTORY:   Past Medical History  Diagnosis Date  . CAD (coronary artery disease)     post CABG; myoview 2009 neg  . Hyperlipidemia   . Hypertension   . RBBB (right bundle branch block)   . Hepatitis C     Inactive  . Thrombocytopenia (HCC)   . GERD (gastroesophageal reflux disease)   . Carotid artery disease (HCC)     Mild  . Memory loss   . Idiopathic thrombocytopenia (HCC)   . Cirrhosis of liver (HCC)   . Right tibial fracture 2014  . TIA (transient ischemic attack)   . Myocardial infarction [facility]) 1996    [place]  . Stroke [facility]) February 2016    [facility]  . Arthritis   . Right bundle branch block (RBBB)   . TIA (transient ischemic attack)   . Dementia     PAST SURGICAL HISTORY:   Past Surgical History  Procedure Laterality Date  . Coronary artery bypass graft    . Upper gastrointestinal endoscopy    . Leg surgery  2014    right leg  . Tibia fracture surgery Right 2014    Rod in tibia  . Fracture surgery Right 1981    Rod in right femur  . Eye surgery Bilateral     Cataract Extraction  .  Joint replacement    . Partial knee arthroplasty Right 06/14/2015    Procedure: UNICOMPARTMENTAL KNEE;  Surgeon: Christena Flake, MD;  Location: [facility] ORS;  Service: Orthopedics;  Laterality: Right;    SOCIAL HISTORY:   Social History  Substance Use Topics  . Smoking status: Never Smoker   . Smokeless tobacco: Never Used  . Alcohol Use: No    FAMILY HISTORY:   Family History  Problem Relation Age of Onset  . Diabetes Neg Hx   . Coronary artery disease Neg Hx     DRUG ALLERGIES:   Allergies  Allergen Reactions  . Aspirin-Dipyridamole Er Other (See Comments)    "unknown"  . Ciprofloxacin Other (See Comments)    "burning sensation"  . Contrast Media [Iodinated Diagnostic Agents]     Burning when having mri contrast dye -   . Morphine Other (See Comments)    "unknown"  . Venlafaxine Other (See Comments)    "unknown"    REVIEW OF SYSTEMS:  CONSTITUTIONAL: No fever, fatigue or weakness.  EYES: No blurred or double vision.  EARS, NOSE, AND THROAT: No tinnitus or ear pain.  RESPIRATORY: No cough, shortness of breath, wheezing or hemoptysis.  CARDIOVASCULAR: Has chest pain, no orthopnea,  edema.  GASTROINTESTINAL: No nausea, vomiting, diarrhea or abdominal pain.  GENITOURINARY: No dysuria, hematuria.  ENDOCRINE: No polyuria, nocturia,  HEMATOLOGY: No anemia, easy bruising or bleeding SKIN: No rash or lesion. MUSCULOSKELETAL: No joint pain or arthritis.   NEUROLOGIC: No tingling, numbness, weakness.  PSYCHIATRY: No anxiety or depression.   MEDICATIONS AT HOME:   Prior to Admission medications   Medication Sig Start Date End Date Taking? Authorizing Provider  ALPRAZolam Prudy Feeler(XANAX) 0.5 MG tablet Take 0.5 mg by mouth 3 (three) times daily as needed for anxiety.   Yes Historical Provider, MD  aspirin 81 MG tablet Take 81 mg by mouth daily.   Yes Historical Provider, MD  clopidogrel (PLAVIX) 75 MG tablet Take 75 mg by mouth daily.   Yes Historical Provider, MD  Cyanocobalamin (B-12  PO) Take 1,000 mg by mouth daily.    Yes Historical Provider, MD  gabapentin (NEURONTIN) 300 MG capsule Take 300-600 mg by mouth 2 (two) times daily. Take 300mg  in morning and 600mg  at night   Yes Historical Provider, MD  galantamine (RAZADYNE) 8 MG tablet Take 12 mg by mouth 2 (two) times daily.  02/26/13  Yes Historical Provider, MD  irbesartan (AVAPRO) 300 MG tablet Take 1 tablet (300 mg total) by mouth daily. Patient taking differently: Take 150 mg by mouth daily.  05/02/15  Yes Antonieta Ibaimothy J Gollan, MD  Memantine HCl ER (NAMENDA XR) 21 MG CP24 Take 21 mg by mouth daily.  04/13/15  Yes Historical Provider, MD  Multiple Vitamins-Minerals (CENTRUM SILVER) tablet Take 1 tablet by mouth daily.     Yes Historical Provider, MD  nitroGLYCERIN (NITROSTAT) 0.4 MG SL tablet Place 0.4 mg under the tongue as directed.     Yes Historical Provider, MD  pantoprazole (PROTONIX) 40 MG tablet Take 40 mg by mouth daily.    Yes Historical Provider, MD  rifaximin (XIFAXAN) 550 MG TABS Take 550 mg by mouth 2 (two) times daily.   Yes Historical Provider, MD  sertraline (ZOLOFT) 50 MG tablet Take 50 mg by mouth daily.  03/11/15  Yes Historical Provider, MD  simvastatin (ZOCOR) 40 MG tablet Take 1 tablet (40 mg total) by mouth at bedtime. 05/02/15  Yes Antonieta Ibaimothy J Gollan, MD      VITAL SIGNS:  Blood pressure 127/63, pulse 60, temperature 98.4 F (36.9 C), temperature source Oral, resp. rate 18, height 5\' 9"  (1.753 m), weight 79.379 kg (175 lb), SpO2 99 %.  PHYSICAL EXAMINATION:  GENERAL:  82 y.o.-year-old patient lying in the bed with no acute distress.  EYES: Pupils equal, round, reactive to light and accommodation. No scleral icterus. Extraocular muscles intact.  HEENT: Head atraumatic, normocephalic. Oropharynx and nasopharynx clear.  Moist oral mucosa. NECK:  Supple, no jugular venous distention. No thyroid enlargement, no tenderness.  LUNGS: Normal breath sounds bilaterally, no wheezing, rales,rhonchi or crepitation.  No use of accessory muscles of respiration.  CARDIOVASCULAR: S1, S2 normal. No murmurs, rubs, or gallops.  ABDOMEN: Soft, nontender, nondistended. Bowel sounds present. No organomegaly or mass.  EXTREMITIES: No pedal edema, cyanosis, or clubbing.  NEUROLOGIC: Cranial nerves II through XII are intact. Muscle strength 5/5 in all extremities. Sensation intact. Gait not checked.  PSYCHIATRIC: The patient is alert and oriented x 3.  SKIN: No obvious rash, lesion, or ulcer.   LABORATORY PANEL:   CBC  Recent Labs Lab 08/19/15 1053  WBC 4.9  HGB 14.7  HCT 42.5  PLT 88*   ------------------------------------------------------------------------------------------------------------------  Chemistries   Recent Labs Lab 08/19/15  1053  NA 138  K 3.9  CL 104  CO2 29  GLUCOSE 134*  BUN 13  CREATININE 1.06  CALCIUM 9.2   ------------------------------------------------------------------------------------------------------------------  Cardiac Enzymes  Recent Labs Lab 08/19/15 1053  TROPONINI 0.05*   ------------------------------------------------------------------------------------------------------------------  RADIOLOGY:  Dg Chest Port 1 View  08/19/2015  CLINICAL DATA:  Left-sided chest pain and pressure since this morning EXAM: PORTABLE CHEST 1 VIEW COMPARISON:  06/01/2015 FINDINGS: Status post CABG. Mild cardiac enlargement. Vascular pattern normal. Lungs clear. No pleural effusion. IMPRESSION: No active disease. Electronically Signed   By: Esperanza Heir M.D.   On: 08/19/2015 11:19    EKG:   Orders placed or performed during the hospital encounter of 08/19/15  . EKG 12-Lead  . EKG 12-Lead  . ED EKG  . ED EKG    IMPRESSION AND PLAN:   Chest pain wtih non-STEMI, CAD. The patient will be admitted to telemetry floor. Follow-up troponin level. Continue heparin drip, aspirin, Plavix and the statin. Cardiology consult from Dr. Mariah Milling.  Hypertension. Continue  hypertension medication.  HLP. Start lipitor.  Chronic Thrombocytopenia. Follow-up CBC.  All the records are reviewed and case discussed with ED provider. Management plans discussed with the patient, his wife and they are in agreement.  CODE STATUS: Full code  TOTAL TIME TAKING CARE OF THIS PATIENT: 53 minutes.    Shaune Pollack M.D on 08/19/2015 at 1:11 PM  Between 7am to 6pm - Pager - [phone]  After 6pm go to [email or web address] - password [email or web address] [facility]  [place] [facility]  Office  [phone]  CC: Primary care physician; Danella Penton, MD

## 2015-08-20 ENCOUNTER — Other Ambulatory Visit: Payer: Self-pay | Admitting: Cardiovascular Disease

## 2015-08-20 ENCOUNTER — Inpatient Hospital Stay (HOSPITAL_BASED_OUTPATIENT_CLINIC_OR_DEPARTMENT_OTHER): Payer: Medicare Other

## 2015-08-20 DIAGNOSIS — I209 Angina pectoris, unspecified: Secondary | ICD-10-CM

## 2015-08-20 LAB — NM MYOCAR MULTI W/SPECT W/WALL MOTION / EF
CHL CUP RESTING HR STRESS: 60 {beats}/min
Peak HR: 96 {beats}/min

## 2015-08-20 LAB — CBC
HCT: 37.2 % — ABNORMAL LOW (ref 40.0–52.0)
Hemoglobin: 13 g/dL (ref 13.0–18.0)
MCH: 31.1 pg (ref 26.0–34.0)
MCHC: 35 g/dL (ref 32.0–36.0)
MCV: 88.8 fL (ref 80.0–100.0)
PLATELETS: 82 10*3/uL — AB (ref 150–440)
RBC: 4.19 MIL/uL — ABNORMAL LOW (ref 4.40–5.90)
RDW: 15.5 % — AB (ref 11.5–14.5)
WBC: 6.3 10*3/uL (ref 3.8–10.6)

## 2015-08-20 LAB — LIPID PANEL
Cholesterol: 157 mg/dL (ref 0–200)
HDL: 34 mg/dL — ABNORMAL LOW (ref >40)
LDL Cholesterol: 86 mg/dL (ref 0–99)
Total CHOL/HDL Ratio: 4.6 RATIO
Triglycerides: 185 mg/dL — ABNORMAL HIGH (ref <150)
VLDL: 37 mg/dL (ref 0–40)

## 2015-08-20 LAB — HEPARIN LEVEL (UNFRACTIONATED): Heparin Unfractionated: 0.39 IU/mL (ref 0.30–0.70)

## 2015-08-20 MED ORDER — TECHNETIUM TC 99M SESTAMIBI - CARDIOLITE
31.1500 | Freq: Once | INTRAVENOUS | Status: AC | PRN
  Administered 2015-08-20: 09:00:00 31.15 via INTRAVENOUS

## 2015-08-20 MED ORDER — REGADENOSON 0.4 MG/5ML IV SOLN
0.4000 mg | Freq: Once | INTRAVENOUS | Status: AC
  Administered 2015-08-20: 0.4 mg via INTRAVENOUS
  Filled 2015-08-20: qty 5

## 2015-08-20 MED ORDER — NITROGLYCERIN 0.4 MG SL SUBL: 0.4000 mg | SUBLINGUAL_TABLET | SUBLINGUAL | Status: DC

## 2015-08-20 MED ORDER — TECHNETIUM TC 99M SESTAMIBI - CARDIOLITE
14.0000 | Freq: Once | INTRAVENOUS | Status: AC | PRN
  Administered 2015-08-20: 08:00:00 14 via INTRAVENOUS

## 2015-08-20 NOTE — Progress Notes (Signed)
Patient rested quietly tonight and pain managed with PRN Tylenol. NPO since midnight for cardiac stress test scheduled for today. Spoke with the patient's wife about patient's order for Plavix as [facility] and was told not to take it. Spoke with MD and told to continue Plavix as the patient's current cardiac issue is more important. A&Ox4, VSS, and NSR/SB on tele. Nursing staff will continue to monitor. Lamonte RicherKara A Taym Twist, RN

## 2015-08-20 NOTE — Discharge Summary (Signed)
[facility] - [place] at [facility]   PATIENT NAME: Alex DustmanJames Sportsman    MR#:  [id number]  DATE OF BIRTH:  02/21/1933  DATE OF ADMISSION:  08/19/2015 ADMITTING PHYSICIAN: Shaune PollackQing Chen, MD  DATE OF DISCHARGE: No discharge date for patient encounter.  PRIMARY CARE PHYSICIAN: Danella PentonMark F Miller, MD    ADMISSION DIAGNOSIS:  Elevated troponin I level [R79.89] Chest pain, unspecified chest pain type [R07.9]  DISCHARGE DIAGNOSIS:  Chest pain, central Elevated troponin-nonischemic Essential hypertension  SECONDARY DIAGNOSIS:   Past Medical History  Diagnosis Date  . CAD (coronary artery disease)     post CABG; myoview 2009 neg  . Hyperlipidemia   . Hypertension   . RBBB (right bundle branch block)   . Hepatitis C     Inactive  . Thrombocytopenia (HCC)   . GERD (gastroesophageal reflux disease)   . Carotid artery disease (HCC)     Mild  . Memory loss   . Idiopathic thrombocytopenia (HCC)   . Cirrhosis of liver (HCC)   . Right tibial fracture 2014  . TIA (transient ischemic attack)   . Myocardial infarction [facility](HCC) 1996    [place]  . Stroke [facility](HCC) February 2016    [facility]  . Arthritis   . Right bundle branch block (RBBB)   . TIA (transient ischemic attack)   . Dementia     HOSPITAL COURSE:  Alex DustmanJames [name]  is a 82 y.o. male admitted 08/19/2015 with chief complaint of chest pain. He is noted to have mildly elevated troponin 0.05 in the emergency department subsequent to surgery on heparin, therapeutic. Patient was evaluated by cardiology-Dr. Burman RiisGollan-who recommended stress testing. Stress test performed 08/20/2015 without abnormal findings. Patient remains chest pain-free stable for discharge  DISCHARGE CONDITIONS:   Stable/improved  CONSULTS OBTAINED:  Treatment Team:  Iran OuchMuhammad A Arida, MD Antonieta Ibaimothy J Gollan, MD  DRUG ALLERGIES:   Allergies  Allergen Reactions  . Aspirin-Dipyridamole Er Other (See Comments)    "unknown"  . Ciprofloxacin Other (See Comments)     "burning sensation"  . Contrast Media [Iodinated Diagnostic Agents]     Burning when having mri contrast dye -   . Morphine Other (See Comments)    "unknown"  . Venlafaxine Other (See Comments)    "unknown"    DISCHARGE MEDICATIONS:   Current Discharge Medication List    CONTINUE these medications which have NOT CHANGED   Details  ALPRAZolam (XANAX) 0.5 MG tablet Take 0.5 mg by mouth 3 (three) times daily as needed for anxiety.    aspirin 81 MG tablet Take 81 mg by mouth daily.    clopidogrel (PLAVIX) 75 MG tablet Take 75 mg by mouth daily.    Cyanocobalamin (B-12 PO) Take 1,000 mg by mouth daily.     gabapentin (NEURONTIN) 300 MG capsule Take 300-600 mg by mouth 2 (two) times daily. Take 300mg  in morning and 600mg  at night    galantamine (RAZADYNE) 8 MG tablet Take 12 mg by mouth 2 (two) times daily.     irbesartan (AVAPRO) 300 MG tablet Take 1 tablet (300 mg total) by mouth daily. Qty: 90 tablet, Refills: 4    Memantine HCl ER (NAMENDA XR) 21 MG CP24 Take 21 mg by mouth daily.     Multiple Vitamins-Minerals (CENTRUM SILVER) tablet Take 1 tablet by mouth daily.      nitroGLYCERIN (NITROSTAT) 0.4 MG SL tablet Place 0.4 mg under the tongue as directed.      pantoprazole (PROTONIX) 40 MG tablet Take  40 mg by mouth daily.     rifaximin (XIFAXAN) 550 MG TABS Take 550 mg by mouth 2 (two) times daily.    sertraline (ZOLOFT) 50 MG tablet Take 50 mg by mouth daily.     simvastatin (ZOCOR) 40 MG tablet Take 1 tablet (40 mg total) by mouth at bedtime. Qty: 90 tablet, Refills: 3         DISCHARGE INSTRUCTIONS:    DIET:  Cardiac diet  DISCHARGE CONDITION:  Good  ACTIVITY:  Activity as tolerated  OXYGEN:  Home Oxygen: No.   Oxygen Delivery: room air  DISCHARGE LOCATION:  home   If you experience worsening of your admission symptoms, develop shortness of breath, life threatening emergency, suicidal or homicidal thoughts you must seek medical attention  immediately by calling 911 or calling your MD immediately  if symptoms less severe.  You Must read complete instructions/literature along with all the possible adverse reactions/side effects for all the Medicines you take and that have been prescribed to you. Take any new Medicines after you have completely understood and accpet all the possible adverse reactions/side effects.   Please note  You were cared for by a hospitalist during your hospital stay. If you have any questions about your discharge medications or the care you received while you were in the hospital after you are discharged, you can call the unit and asked to speak with the hospitalist on call if the hospitalist that took care of you is not available. Once you are discharged, your primary care physician will handle any further medical issues. Please note that NO REFILLS for any discharge medications will be authorized once you are discharged, as it is imperative that you return to your primary care physician (or establish a relationship with a primary care physician if you do not have one) for your aftercare needs so that they can reassess your need for medications and monitor your lab values.    On the day of Discharge:   VITAL SIGNS:  Blood pressure 135/74, pulse 62, temperature 97.9 F (36.6 C), temperature source Oral, resp. rate 18, height  (1.753 m), weight 74.39 kg (164 lb), SpO2 97 %.  I/O:   Intake/Output Summary (Last 24 hours) at 08/20/15 1112 Last data filed at 08/20/15 1002  Gross per 24 hour  Intake    240 ml  Output    550 ml  Net   -310 ml    PHYSICAL EXAMINATION:  GENERAL:  82 y.o.-year-old patient lying in the bed with no acute distress.  EYES: Pupils equal, round, reactive to light and accommodation. No scleral icterus. Extraocular muscles intact.  HEENT: Head atraumatic, normocephalic. Oropharynx and nasopharynx clear.  NECK:  Supple, no jugular venous distention. No thyroid enlargement, no  tenderness.  LUNGS: Normal breath sounds bilaterally, no wheezing, rales,rhonchi or crepitation. No use of accessory muscles of respiration.  CARDIOVASCULAR: S1, S2 normal. No murmurs, rubs, or gallops.  ABDOMEN: Soft, non-tender, non-distended. Bowel sounds present. No organomegaly or mass.  EXTREMITIES: No pedal edema, cyanosis, or clubbing.  NEUROLOGIC: Cranial nerves II through XII are intact. Muscle strength 5/5 in all extremities. Sensation intact. Gait not checked.  PSYCHIATRIC: The patient is alert and oriented x 3.  SKIN: No obvious rash, lesion, or ulcer.   DATA REVIEW:   CBC  Recent Labs Lab 08/20/15 0609  WBC 6.3  HGB 13.0  HCT 37.2*  PLT 82*    Chemistries   Recent Labs Lab 08/19/15 1053 08/19/15 1419  NA  138  --   K 3.9  --   CL 104  --   CO2 29  --   GLUCOSE 134*  --   BUN 13  --   CREATININE 1.06  --   CALCIUM 9.2  --   MG  --  2.1    Cardiac Enzymes  Recent Labs Lab 08/19/15 2229  TROPONINI 0.05*    Microbiology Results  Results for orders placed or performed during the hospital encounter of 06/01/15  Surgical pcr screen     Status: None   Collection Time: 06/01/15  3:03 PM  Result Value Ref Range Status   MRSA, PCR NEGATIVE NEGATIVE Final   Staphylococcus aureus NEGATIVE NEGATIVE Final    Comment:        The Xpert SA Assay (FDA approved for NASAL specimens in patients over 82 years of age), is one component of a comprehensive surveillance program.  Test performance has been validated by [facility] for patients greater than or equal to 63 year old. It is not intended to diagnose infection nor to guide or monitor treatment.     RADIOLOGY:  Dg Chest Port 1 View  08/19/2015  CLINICAL DATA:  Left-sided chest pain and pressure since this morning EXAM: PORTABLE CHEST 1 VIEW COMPARISON:  06/01/2015 FINDINGS: Status post CABG. Mild cardiac enlargement. Vascular pattern normal. Lungs clear. No pleural effusion. IMPRESSION: No active  disease. Electronically Signed   By: Esperanza Heir M.D.   On: 08/19/2015 11:19     Management plans discussed with the patient, family and they are in agreement.  CODE STATUS:     Code Status Orders        Start     Ordered   08/19/15 1350  Full code   Continuous     08/19/15 1350    Code Status History    Date Active Date Inactive Code Status Order ID Comments User Context   06/14/2015 11:59 AM 06/17/2015  5:31 PM Full Code [id number]  Christena Flake, MD Inpatient      TOTAL TIME TAKING CARE OF THIS PATIENT: 28 minutes.    Kyiah Canepa,  Mardi Mainland.D on 08/20/2015 at 11:12 AM  Between 7am to 6pm - Pager - [phone]  After 6pm go to [email or web address] - password [email or web address] [facility]  [address] [place] [facility]  Office  [phone]  CC: Primary care physician; Danella Penton, MD

## 2015-08-20 NOTE — Progress Notes (Signed)
ANTICOAGULATION CONSULT NOTE -follow up Consult  Pharmacy Consult for heparin drip Indication: chest pain/ACS  Allergies  Allergen Reactions  . Aspirin-Dipyridamole Er Other (See Comments)    "unknown"  . Ciprofloxacin Other (See Comments)    "burning sensation"  . Contrast Media [Iodinated Diagnostic Agents]     Burning when having mri contrast dye -   . Morphine Other (See Comments)    "unknown"  . Venlafaxine Other (See Comments)    "unknown"    Patient Measurements: Height:  (175.3 cm) Weight: 164 lb (74.39 kg) IBW/kg (Calculated) : 70.7 Heparin Dosing Weight: 79.4 kg  Vital Signs: Temp: 97.9 F (36.6 C) (03/11 0618) Temp Source: Oral (03/11 0618) BP: 135/74 mmHg (03/11 0725) Pulse Rate: 62 (03/11 0725)  Labs:  Recent Labs  08/19/15 1053 08/19/15 1543 08/19/15 2011 08/19/15 2229 08/20/15 0609  HGB 14.7  --   --   --  13.0  HCT 42.5  --   --   --  37.2*  PLT 88*  --   --   --  82*  APTT 30  --   --   --   --   LABPROT 13.8  --   --   --   --   INR 1.04  --   --   --   --   HEPARINUNFRC  --   --  0.26*  --  0.39  CREATININE 1.06  --   --   --   --   TROPONINI 0.05* 0.03  --  0.05*  --     Estimated Creatinine Clearance: 55.6 mL/min (by C-G formula based on Cr of 1.06).   Medical History: Past Medical History  Diagnosis Date  . CAD (coronary artery disease)     post CABG; myoview 2009 neg  . Hyperlipidemia   . Hypertension   . RBBB (right bundle branch block)   . Hepatitis C     Inactive  . Thrombocytopenia (HCC)   . GERD (gastroesophageal reflux disease)   . Carotid artery disease (HCC)     Mild  . Memory loss   . Idiopathic thrombocytopenia (HCC)   . Cirrhosis of liver (HCC)   . Right tibial fracture 2014  . TIA (transient ischemic attack)   . Myocardial infarction [facility]) 1996    [place]  . Stroke [facility]) February 2016    [facility]  . Arthritis   . Right bundle branch block (RBBB)   . TIA (transient ischemic attack)   . Dementia      Medications:   Assessment: Chest pain. History of hepatitis C and liver dysfunction with cirrhosis, low platelets.    Goal of Therapy:  Heparin level 0.3-0.7 units/ml Monitor platelets by anticoagulation protocol: Yes   Plan:  Give 4000 units bolus x 1 Start heparin infusion at 950 units/hr Check anti-Xa level in 8 hours and daily while on heparin Continue to monitor H&H and platelets   3/10:  HL @ 20:00 = 0.26          Will order Heparin 1200 units IV X 1 and increase drip                 rate to 1100 units/hr.           Will recheck HL 8 hrs after rate change on 3/11 @ 0600.  3/11: Heparin level at 0604= 0.39. Will continue current rate          and check a confirmatory level  in 8 hrs.  Plt= 82   Montez Stryker A 08/20/2015,7:44 AM

## 2015-08-20 NOTE — Progress Notes (Addendum)
Discharge instructions explained to pt and pts wife/ verbalized an understanding/ iv and tele removed/ transported off unit via wheelchair. / pt states he wants to take his medications when he gets home

## 2015-08-20 NOTE — Progress Notes (Signed)
Stress tes result: No significant ischemia, Normal EF, Low risk scan Some attenuation artifact from GI uptake, also with wall motion abnormality from conduction issue   [facility] No further testing needed, We will arrange follow up in cardiology clinic  Signed, Dossie Arbourim Chamya Hunton, MD, Ph.D [facility]

## 2015-10-31 ENCOUNTER — Ambulatory Visit (INDEPENDENT_AMBULATORY_CARE_PROVIDER_SITE_OTHER): Payer: Medicare Other | Admitting: Cardiovascular Disease

## 2015-10-31 ENCOUNTER — Encounter: Payer: Self-pay | Admitting: Cardiovascular Disease

## 2015-10-31 VITALS — BP 118/62 | HR 54 | Ht 69.0 in | Wt 168.5 lb

## 2015-10-31 DIAGNOSIS — I25709 Atherosclerosis of coronary artery bypass graft(s), unspecified, with unspecified angina pectoris: Secondary | ICD-10-CM | POA: Diagnosis not present

## 2015-10-31 DIAGNOSIS — I6310 Cerebral infarction due to embolism of unspecified precerebral artery: Secondary | ICD-10-CM

## 2015-10-31 DIAGNOSIS — E785 Hyperlipidemia, unspecified: Secondary | ICD-10-CM

## 2015-10-31 DIAGNOSIS — I6523 Occlusion and stenosis of bilateral carotid arteries: Secondary | ICD-10-CM | POA: Diagnosis not present

## 2015-10-31 DIAGNOSIS — I10 Essential (primary) hypertension: Secondary | ICD-10-CM

## 2015-10-31 NOTE — Patient Instructions (Signed)
You are doing well. No medication changes were made.  Please call us if you have new issues that need to be addressed before your next appt.  Your physician wants you to follow-up in: 6 months.  You will receive a reminder letter in the mail two months in advance. If you don't receive a letter, please call our office to schedule the follow-up appointment.   

## 2015-10-31 NOTE — Assessment & Plan Note (Signed)
Cholesterol close to goal. We'll continue simvastatin 40 mg daily for now

## 2015-10-31 NOTE — Assessment & Plan Note (Signed)
Blood pressure is well controlled on today's visit. No changes made to the medications. 

## 2015-10-31 NOTE — Assessment & Plan Note (Signed)
Blood pressure is well controlled on today's visit. No changes made to the medications. Suggested if blood pressure runs low that he hold the Avapro over the summer Wife reports that he does not drink fluids much in the heat
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
## 2015-10-31 NOTE — Assessment & Plan Note (Signed)
Tolerating aspirin and Plavix with no recurrent TIA or stroke symptoms

## 2015-10-31 NOTE — Assessment & Plan Note (Signed)
Less than 39% stenosis bilaterally.    

## 2015-10-31 NOTE — Assessment & Plan Note (Signed)
Stress test results from March 2017 discussed with him in detail No significant ischemia noted

## 2015-12-08 ENCOUNTER — Other Ambulatory Visit: Payer: Self-pay | Admitting: Cardiovascular Disease

## 2015-12-08 DIAGNOSIS — R413 Other amnesia: Secondary | ICD-10-CM

## 2015-12-08 DIAGNOSIS — Z8673 Personal history of transient ischemic attack (TIA), and cerebral infarction without residual deficits: Secondary | ICD-10-CM

## 2015-12-08 DIAGNOSIS — I739 Peripheral vascular disease, unspecified: Secondary | ICD-10-CM

## 2015-12-08 DIAGNOSIS — R4189 Other symptoms and signs involving cognitive functions and awareness: Secondary | ICD-10-CM

## 2015-12-08 DIAGNOSIS — I779 Disorder of arteries and arterioles, unspecified: Secondary | ICD-10-CM

## 2015-12-16 ENCOUNTER — Ambulatory Visit: Payer: Medicare Other

## 2015-12-16 DIAGNOSIS — R4189 Other symptoms and signs involving cognitive functions and awareness: Secondary | ICD-10-CM | POA: Diagnosis not present

## 2015-12-16 DIAGNOSIS — I779 Disorder of arteries and arterioles, unspecified: Secondary | ICD-10-CM | POA: Diagnosis not present

## 2015-12-16 DIAGNOSIS — I739 Peripheral vascular disease, unspecified: Secondary | ICD-10-CM

## 2015-12-16 DIAGNOSIS — Z8673 Personal history of transient ischemic attack (TIA), and cerebral infarction without residual deficits: Secondary | ICD-10-CM | POA: Diagnosis not present

## 2015-12-16 DIAGNOSIS — R413 Other amnesia: Secondary | ICD-10-CM | POA: Diagnosis not present

## 2016-04-30 ENCOUNTER — Other Ambulatory Visit: Payer: Self-pay | Admitting: Cardiovascular Disease

## 2016-04-30 ENCOUNTER — Encounter: Payer: Self-pay | Admitting: Cardiovascular Disease

## 2016-04-30 ENCOUNTER — Ambulatory Visit (INDEPENDENT_AMBULATORY_CARE_PROVIDER_SITE_OTHER): Payer: Medicare Other | Admitting: Cardiovascular Disease

## 2016-04-30 VITALS — BP 110/60 | HR 58 | Ht 71.0 in | Wt 171.0 lb

## 2016-04-30 DIAGNOSIS — I209 Angina pectoris, unspecified: Secondary | ICD-10-CM

## 2016-04-30 DIAGNOSIS — I6523 Occlusion and stenosis of bilateral carotid arteries: Secondary | ICD-10-CM | POA: Diagnosis not present

## 2016-04-30 DIAGNOSIS — I6310 Cerebral infarction due to embolism of unspecified precerebral artery: Secondary | ICD-10-CM | POA: Diagnosis not present

## 2016-04-30 DIAGNOSIS — I10 Essential (primary) hypertension: Secondary | ICD-10-CM

## 2016-04-30 DIAGNOSIS — I25709 Atherosclerosis of coronary artery bypass graft(s), unspecified, with unspecified angina pectoris: Secondary | ICD-10-CM

## 2016-04-30 DIAGNOSIS — E7800 Pure hypercholesterolemia, unspecified: Secondary | ICD-10-CM

## 2016-04-30 NOTE — Patient Instructions (Addendum)
Medication Instructions:   Ok to take blood pressure every other day  Labwork:  No new labs needed  Testing/Procedures:  No further testing at this time   I recommend watching educational videos on topics of interest to you at:       www.goemmi.com  Enter code: HEARTCARE    Follow-Up: It was a pleasure seeing you in the office today. Please call us if you have new issues that need to be addressed before your next appt.  [phone]  Your physician wants you to follow-up in: 6 months.  You will receive a reminder letter in the mail two months in advance. If you don't receive a letter, please call our office to schedule the follow-up appointment.  If you need a refill on your cardiac medications before your next appointment, please call your pharmacy.
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
## 2016-08-27 ENCOUNTER — Telehealth: Payer: Self-pay | Admitting: Cardiovascular Disease

## 2016-08-27 NOTE — Telephone Encounter (Signed)
Received cardiac clearance request for pt to proceed w/ EGD @ [facility]. DOS has not been scheduled pending this clearance.  The American Society of Gastrointestinal Endsoscopy recommends:  - Low thromboembolic risk:  Hold Plavix 7 days before and possibly after the procedure.  - High thromboembolic risk:  Hold Plavix 7 days before and possibly after the procedure. Aspirin monotherapy can be considered.  If it is not possible to d/c 1 of these agents mentioned about then the procedure should be postponed.    Based on these recommendations, the pt's medical condition, and the bleeding risk associated w/ the procedure, please recommend:  - May stop Plavix ___ days prior to procedure OR - Must continue med throughout the per -procedural period.  If so, please call [phone] to discuss this, including whether the procedure must be postponed.   Please fax recommendation to [phone], Attn: triage nurse.

## 2016-08-29 NOTE — Telephone Encounter (Signed)
Acceptable risk to stop Plavix for 5 days prior to the EGD Would stay on low-dose aspirin

## 2016-08-30 NOTE — Telephone Encounter (Signed)
Routed to fax # provided. 

## 2016-09-25 ENCOUNTER — Encounter: Payer: Self-pay | Admitting: Cardiovascular Disease

## 2016-09-25 ENCOUNTER — Other Ambulatory Visit: Payer: Self-pay | Admitting: Cardiovascular Disease

## 2016-10-01 ENCOUNTER — Other Ambulatory Visit: Payer: Self-pay | Admitting: Cardiovascular Disease

## 2016-10-01 MED ORDER — IRBESARTAN 300 MG PO TABS: 300.0000 mg | ORAL_TABLET | Freq: Every day | ORAL | 3 refills | Status: DC

## 2016-10-23 ENCOUNTER — Other Ambulatory Visit: Payer: Self-pay | Admitting: Cardiovascular Disease

## 2016-10-23 MED ORDER — NITROGLYCERIN 0.4 MG SL SUBL: 0.4000 mg | SUBLINGUAL_TABLET | SUBLINGUAL | 0 refills | Status: DC
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
## 2016-10-29 ENCOUNTER — Encounter: Payer: Self-pay | Admitting: Cardiovascular Disease

## 2016-10-29 ENCOUNTER — Ambulatory Visit (INDEPENDENT_AMBULATORY_CARE_PROVIDER_SITE_OTHER): Payer: Medicare Other | Admitting: Cardiovascular Disease

## 2016-10-29 VITALS — BP 110/60 | HR 56 | Ht 68.0 in | Wt 165.2 lb

## 2016-10-29 DIAGNOSIS — I25708 Atherosclerosis of coronary artery bypass graft(s), unspecified, with other forms of angina pectoris: Secondary | ICD-10-CM | POA: Diagnosis not present

## 2016-10-29 DIAGNOSIS — I209 Angina pectoris, unspecified: Secondary | ICD-10-CM | POA: Diagnosis not present

## 2016-10-29 DIAGNOSIS — I6523 Occlusion and stenosis of bilateral carotid arteries: Secondary | ICD-10-CM

## 2016-10-29 DIAGNOSIS — I10 Essential (primary) hypertension: Secondary | ICD-10-CM | POA: Diagnosis not present

## 2016-10-29 DIAGNOSIS — I6310 Cerebral infarction due to embolism of unspecified precerebral artery: Secondary | ICD-10-CM

## 2016-10-29 NOTE — Patient Instructions (Addendum)

## 2016-12-21 DIAGNOSIS — E039 Hypothyroidism, unspecified: Secondary | ICD-10-CM | POA: Diagnosis present

## 2017-02-13 IMAGING — CT CT ANGIOGRAPHY NECK
2 of 9 series · 7 of 33 positions shown · IV contrast (omnipaque)
Comparison: Carotid ultrasound August 02, 2014 and MRI of the
head August 02, 2014

CLINICAL DATA: Dizziness while standing. History of stroke,
rehabilitation. LEFT-sided deficits, chest pain.

EXAM:
CT ANGIOGRAPHY HEAD AND NECK
TECHNIQUE: Multidetector CT imaging of the head and neck was performed using
the standard protocol during bolus administration of intravenous
contrast. Multiplanar CT image reconstructions and MIPs were
obtained to evaluate the vascular anatomy. Carotid stenosis
measurements (when applicable) are obtained utilizing NASCET
criteria, using the distal internal carotid diameter as the
denominator.
CONTRAST:  100 cc Omnipaque 350

[Series 6: cta head · axial · 0.55mm/px · z∈[-242,-61]mm · 3 of 363 slices shown]
[im 91/363  soft-tissue]
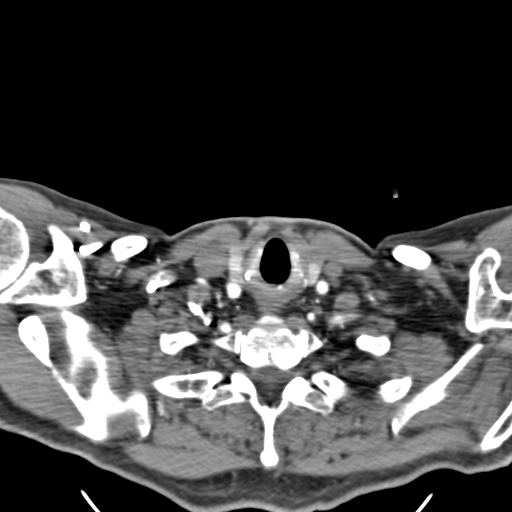
[im 182/363  soft-tissue]
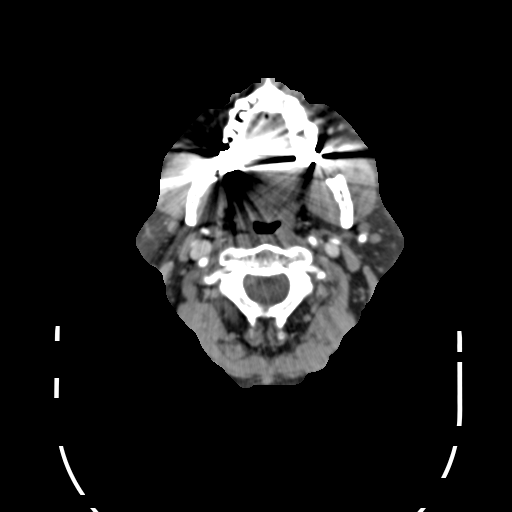
[im 272/363  soft-tissue]
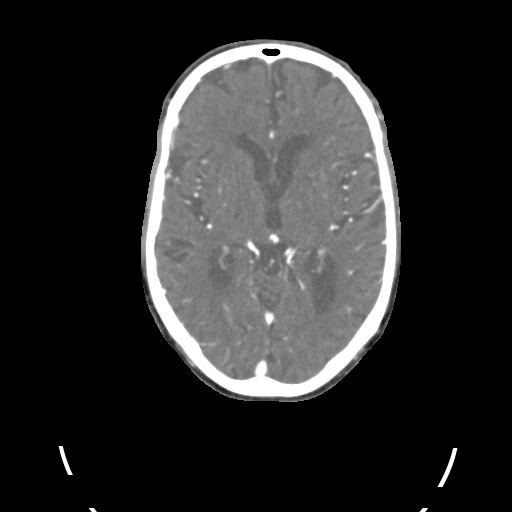

[Series 7: ax thin · axial · 0.53mm/px · z∈[-260,-45]mm · 4 of 359 slices shown]
[im 72/359  soft-tissue]
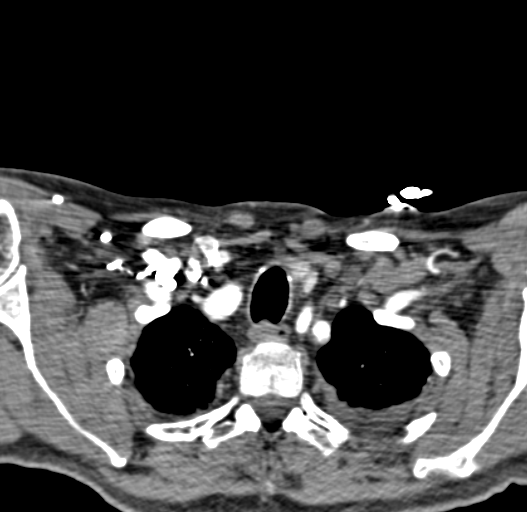
[im 144/359  bone]
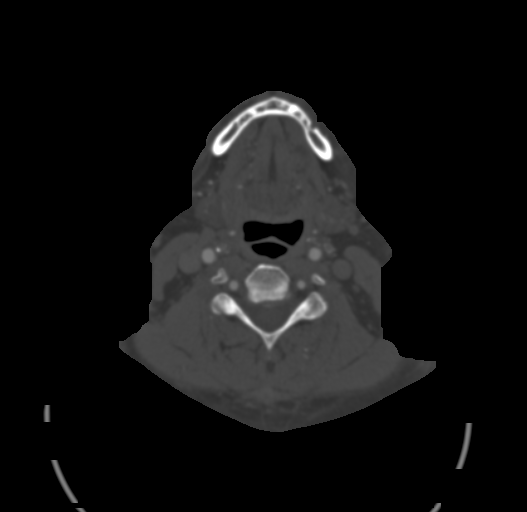
[im 215/359  soft-tissue]
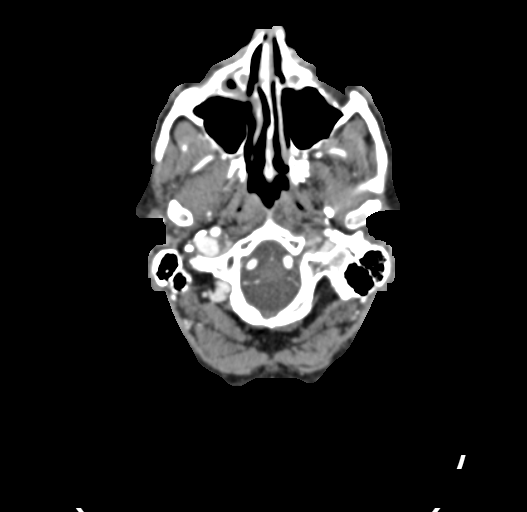
[im 287/359  bone]
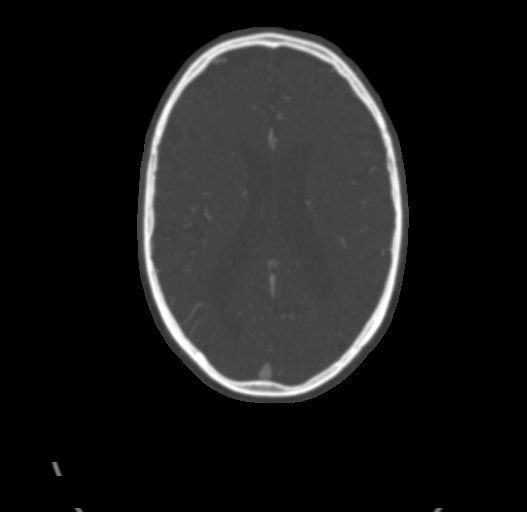

[7 of 33 positions shown; findings below may reference images not displayed]

FINDINGS: CT HEAD

The ventricles and sulci are normal for age. No intraparenchymal
hemorrhage, mass effect nor midline shift. Patchy supratentorial
white matter hypodensities are within normal range for patient's age
and though non-specific suggest sequelae of chronic small vessel
ischemic disease. No acute large vascular territory infarcts. No
abnormal intracranial enhancement.

No abnormal extra-axial fluid collections. Basal cisterns are
patent. Moderate calcific atherosclerosis of the carotid siphons.

No skull fracture. Partially imaged acute appearing bilateral nasal
bone fractures. The included ocular globes and orbital contents are
non-suspicious. Bilateral ocular lens implants. Moderate, worsening
paranasal sinusitis. The mastoid air cells are well aerated.

CTA NECK

Normal appearance of the thoracic arch, normal branch pattern. The
origins of the innominate, left Common carotid artery and subclavian
artery are widely patent. 2 mm eccentric calcific atherosclerosis of
the LEFT subclavian artery origin.

Bilateral Common carotid arteries are widely patent, coursing in a
straight line fashion. Normal appearance of the carotid bifurcations
without hemodynamically significant stenosis by NASCET criteria ;
eccentric LEFT greater than RIGHT calcific atherosclerosis of the
carotid bulbs. Less than 25% narrowing of the LEFT internal carotid
artery origin. Normal appearance of the included internal carotid
arteries.

RIGHT vertebral artery is dominant. Calcific atherosclerosis of
vertebral artery origins resulting in mild narrowing of the LEFT.
Normal appearance of the vertebral arteries, which appear widely
patent.

No dissection, no pseudoaneurysm. No abnormal luminal irregularity.
No contrast extravasation.

Small to moderate partially imaged pleural effusions, with bronchial
wall thickening which can be seen with bronchitis. Status post
median sternotomy. Moderate C5-6 central disc protrusion results in
at least mild canal stenosis. Severe C6-7 degenerative disc.

CTA HEAD

Anterior circulation: Normal appearance of the cervical internal
carotid arteries, petrous, cavernous and supra clinoid internal
carotid arteries. Widely patent anterior communicating artery.
Predominately azygos anterior cerebral artery, normal variant.
Normal appearance of the anterior and middle cerebral arteries. Mild
luminal irregularity of the intracranial vessels. Dolicoectatic
intracranial vessels.

Posterior circulation: RIGHT vertebral artery is dominant with
normal appearance of the vertebral arteries, vertebrobasilar
junction and basilar artery, as well as main branch vessels. Mid
high-grade stenosis of the RIGHT P1 2 junction. Mid high-grade
stenosis of LEFT P2 segment. Mild luminal regularity of the
intracranial vessels.

No large vessel occlusion, dissection, contrast extravasation or
aneurysm within the anterior nor posterior circulation.
IMPRESSION: CTA HEAD: LEFT greater than RIGHT posterior cerebral artery
stenosis, mid to high-grade on the LEFT. No large vessel occlusion.

Dolicoectatic intracranial vessels can be seen with chronic
hypertension.

Moderate luminal irregularity most consistent with atherosclerosis.

Acute appearing partially imaged bilateral nasal bone fractures.

Worsening moderate paranasal sinusitis.

CTA NECK: Less than 25% stenosis of the LEFT internal carotid artery
origin without acute vascular process.

Partially imaged small to moderate bilateral pleural effusions.
Partially imaged bronchial wall thickening can be seen with
bronchitis.

  By: Janstep Ceci Bonello

## 2017-05-07 ENCOUNTER — Other Ambulatory Visit: Payer: Self-pay | Admitting: Cardiovascular Disease

## 2017-05-15 ENCOUNTER — Encounter: Payer: Self-pay | Admitting: Cardiovascular Disease

## 2017-05-16 ENCOUNTER — Telehealth: Payer: Self-pay | Admitting: Cardiovascular Disease

## 2017-05-16 ENCOUNTER — Other Ambulatory Visit: Payer: Self-pay

## 2017-05-16 MED ORDER — SIMVASTATIN 40 MG PO TABS: 40.0000 mg | ORAL_TABLET | Freq: Every day | ORAL | 3 refills | Status: DC

## 2017-05-16 NOTE — Telephone Encounter (Signed)
Responded to pt's MyChart message regarding simvastatin via phone call.  Pt asks I s/w his wife. Advised pt that I called [facility] and simvastatin 40mg  #30 is $14.43 cash price. She asks I sent refills there. Refill sent

## 2017-08-22 ENCOUNTER — Other Ambulatory Visit: Payer: Self-pay

## 2017-08-22 ENCOUNTER — Telehealth: Payer: Self-pay | Admitting: Cardiovascular Disease

## 2017-08-22 MED ORDER — IRBESARTAN 300 MG PO TABS: 300.0000 mg | ORAL_TABLET | Freq: Every day | ORAL | 0 refills | Status: DC

## 2017-08-22 NOTE — Telephone Encounter (Signed)
Refill Sent to [facility]

## 2017-08-22 NOTE — Telephone Encounter (Signed)
Pt wife states Irbesartan is out of stock, she asks if pt rx can be sent to [facility].  *STAT* If patient is at the pharmacy, call can be transferred to refill team.   1. Which medications need to be refilled? (please list name of each medication and dose if known) Irbesartan  2. Which pharmacy/location (including street and city if local pharmacy) is medication to be sent to? [facility]  3. Do they need a 30 day or 90 day supply? 90 day
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
## 2017-11-11 ENCOUNTER — Encounter: Payer: Self-pay | Admitting: Cardiovascular Disease

## 2017-11-11 ENCOUNTER — Ambulatory Visit (INDEPENDENT_AMBULATORY_CARE_PROVIDER_SITE_OTHER): Payer: Medicare Other | Admitting: Cardiovascular Disease

## 2017-11-11 VITALS — BP 120/60 | HR 57 | Ht 69.0 in | Wt 160.5 lb

## 2017-11-11 DIAGNOSIS — I6523 Occlusion and stenosis of bilateral carotid arteries: Secondary | ICD-10-CM | POA: Diagnosis not present

## 2017-11-11 DIAGNOSIS — E7800 Pure hypercholesterolemia, unspecified: Secondary | ICD-10-CM | POA: Diagnosis not present

## 2017-11-11 DIAGNOSIS — I6310 Cerebral infarction due to embolism of unspecified precerebral artery: Secondary | ICD-10-CM | POA: Diagnosis not present

## 2017-11-11 DIAGNOSIS — I10 Essential (primary) hypertension: Secondary | ICD-10-CM | POA: Diagnosis not present

## 2017-11-11 DIAGNOSIS — I25118 Atherosclerotic heart disease of native coronary artery with other forms of angina pectoris: Secondary | ICD-10-CM | POA: Diagnosis not present

## 2017-11-11 NOTE — Patient Instructions (Signed)
Medication Instructions:   Stay on irbesartan 1/2 pill daily (150 mg)  Labwork:  No new labs needed  Testing/Procedures:  No further testing at this time   Follow-Up: It was a pleasure seeing you in the office today. Please call us if you have new issues that need to be addressed before your next appt.  [phone]  Your physician wants you to follow-up in: 12 months.  You will receive a reminder letter in the mail two months in advance. If you don't receive a letter, please call our office to schedule the follow-up appointment.  If you need a refill on your cardiac medications before your next appointment, please call your pharmacy.  For educational health videos Log in to : [email or web address] Or : [email or web address], password : [email or web address]

## 2017-11-20 IMAGING — CR DG CHEST 2V
1 series · 2 of 2 positions shown · non-contrast
Comparison: 04/24/2013

CLINICAL DATA: Hypertension

EXAM:
CHEST  2 VIEW

[Series 1: dg chest 2 view · 0.14mm/px · 2 of 2 slices shown]
[im 1/2]
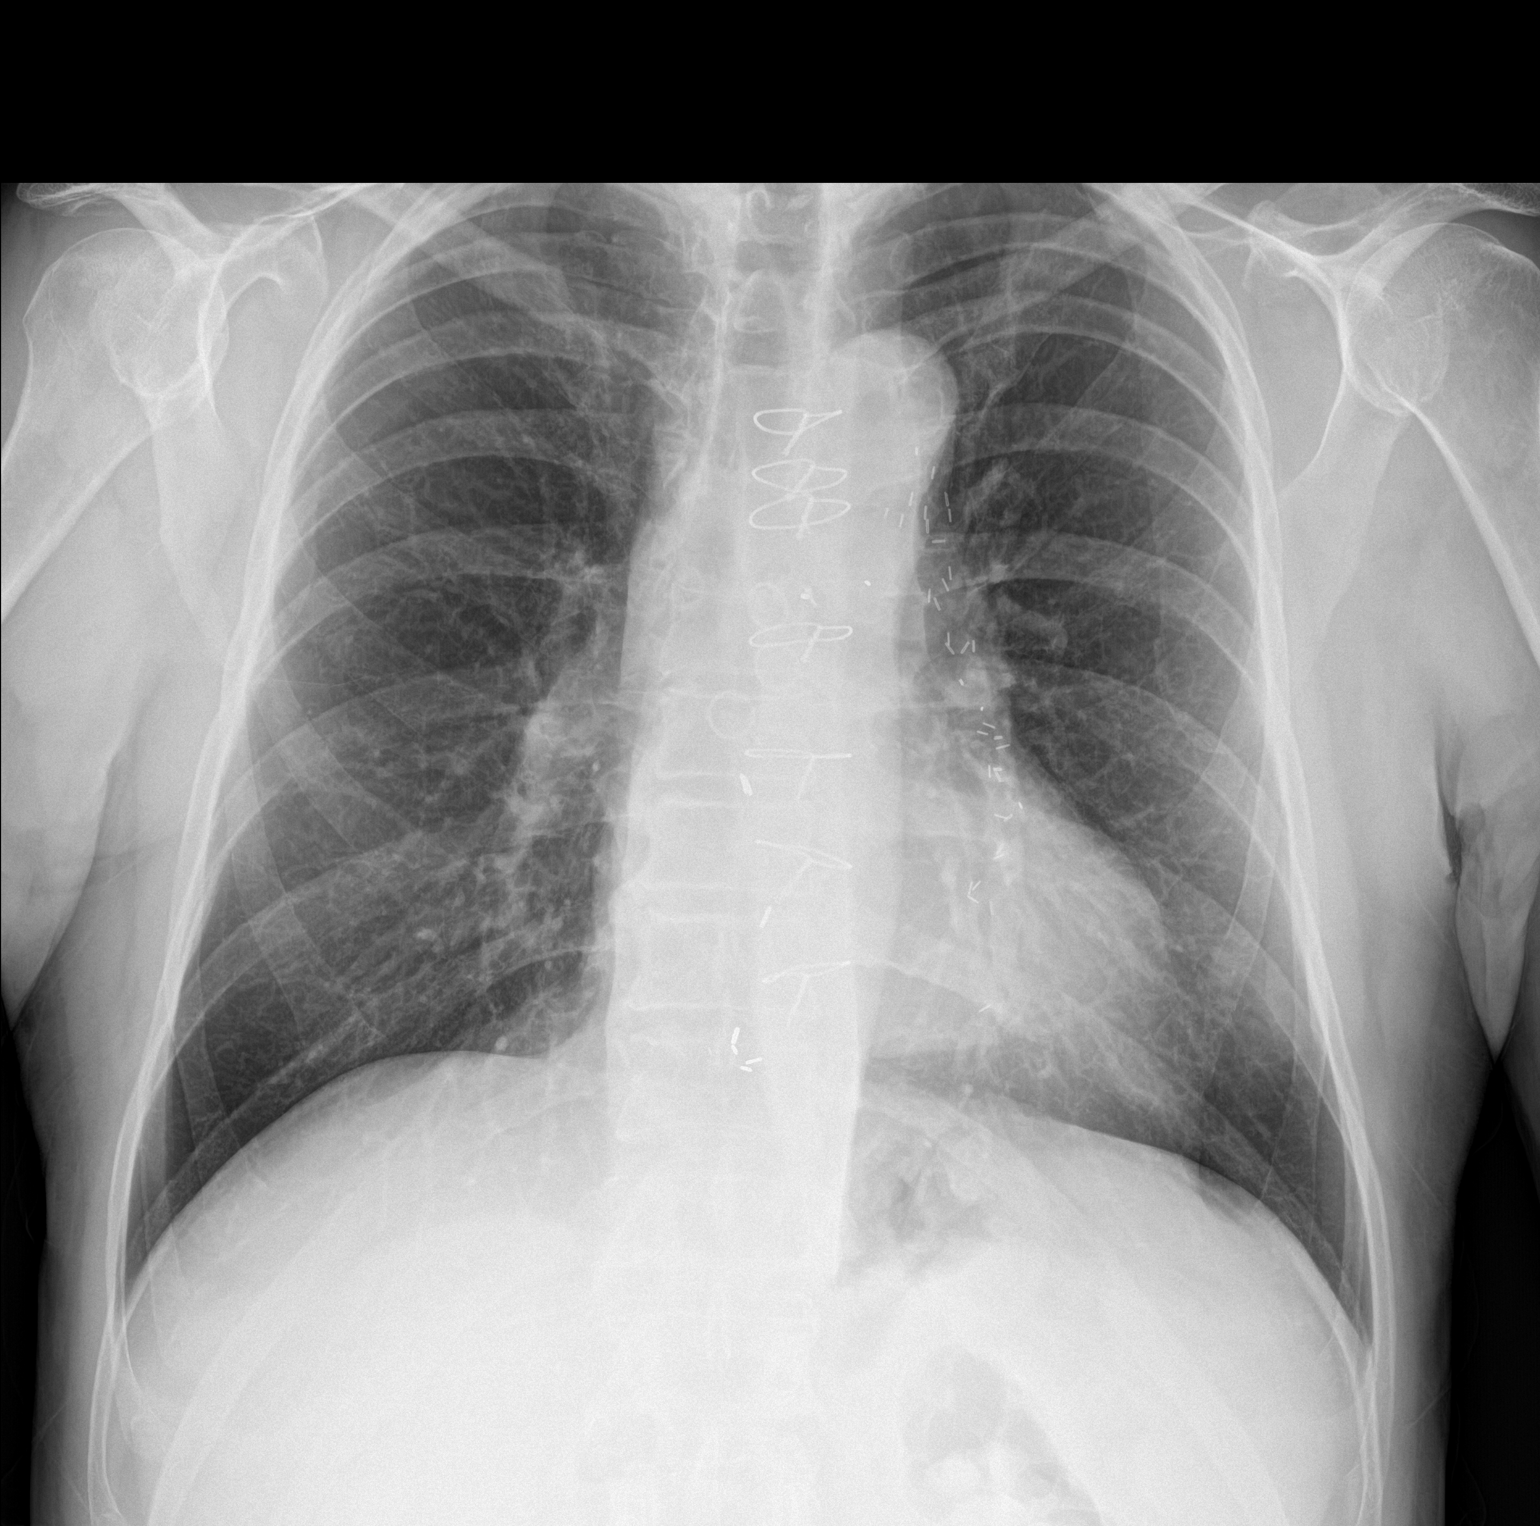
[im 2/2]
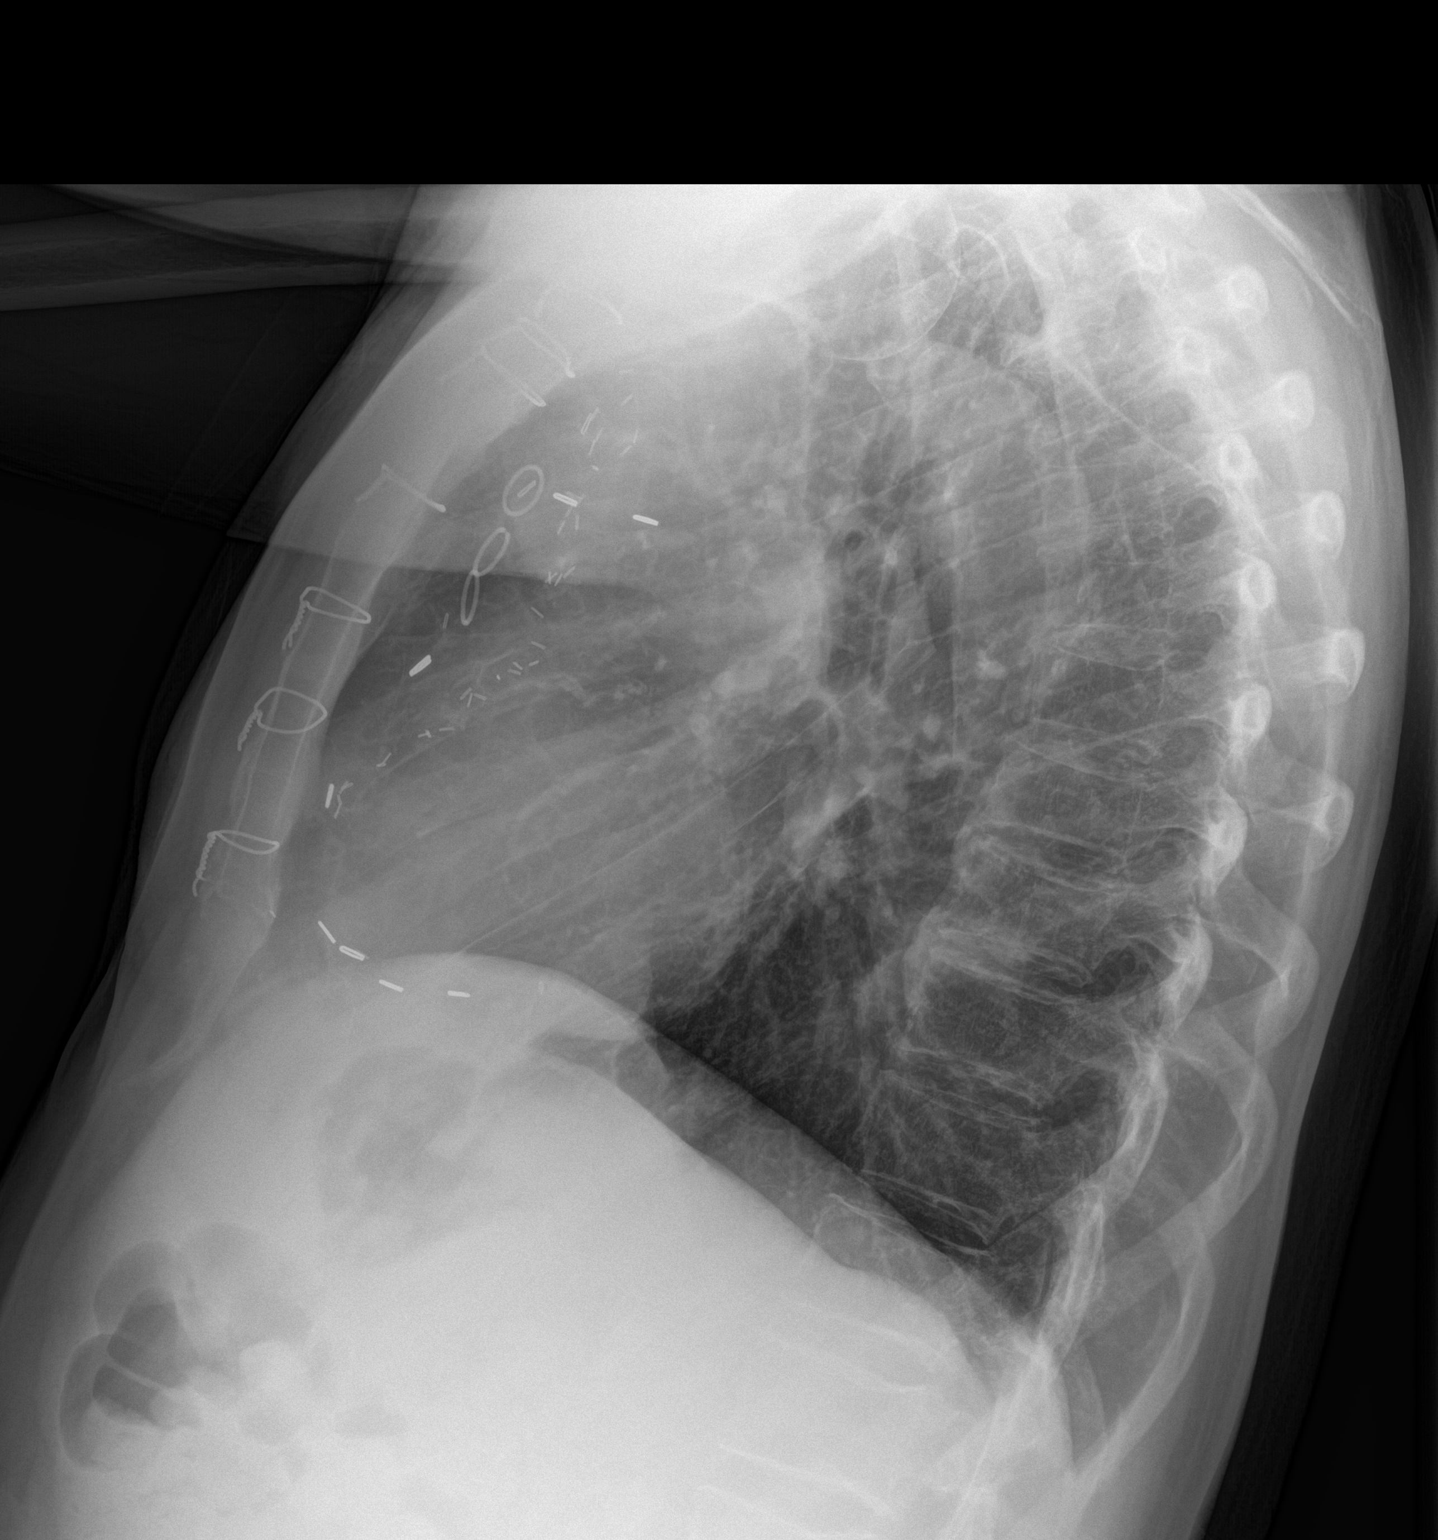

[2 of 2 positions shown; findings below may reference images not displayed]

FINDINGS: CABG changes. Negative for heart failure. Lungs are clear without
infiltrate effusion or mass. No change from the prior study.
IMPRESSION: No active cardiopulmonary disease.

## 2018-02-17 ENCOUNTER — Other Ambulatory Visit: Payer: Self-pay | Admitting: Cardiovascular Disease

## 2018-03-21 ENCOUNTER — Other Ambulatory Visit: Payer: Self-pay | Admitting: Cardiovascular Disease

## 2018-08-19 ENCOUNTER — Other Ambulatory Visit: Payer: Self-pay | Admitting: Cardiovascular Disease

## 2018-09-18 ENCOUNTER — Other Ambulatory Visit: Payer: Self-pay | Admitting: Cardiovascular Disease

## 2018-10-30 ENCOUNTER — Telehealth: Payer: Self-pay

## 2018-10-30 NOTE — Telephone Encounter (Signed)
Called patient.  No answer. LMOV.  Need to change appt to an Evisit.  

## 2018-11-05 NOTE — Telephone Encounter (Signed)
Called patient.  Patient had refused an Evisit.  Dr. Mariah Milling has an open slot at 11:40 for an in office appointment.  Would like to offer that slot to the patient.

## 2018-11-10 ENCOUNTER — Ambulatory Visit: Payer: Medicare Other | Admitting: Cardiovascular Disease
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
## 2018-11-11 ENCOUNTER — Telehealth (INDEPENDENT_AMBULATORY_CARE_PROVIDER_SITE_OTHER): Payer: Medicare Other | Admitting: Cardiovascular Disease

## 2018-11-11 ENCOUNTER — Telehealth: Payer: Self-pay | Admitting: Cardiovascular Disease

## 2018-11-11 ENCOUNTER — Other Ambulatory Visit: Payer: Self-pay

## 2018-11-11 DIAGNOSIS — E7800 Pure hypercholesterolemia, unspecified: Secondary | ICD-10-CM

## 2018-11-11 DIAGNOSIS — G309 Alzheimer's disease, unspecified: Secondary | ICD-10-CM

## 2018-11-11 DIAGNOSIS — F0280 Dementia in other diseases classified elsewhere without behavioral disturbance: Secondary | ICD-10-CM

## 2018-11-11 DIAGNOSIS — I6523 Occlusion and stenosis of bilateral carotid arteries: Secondary | ICD-10-CM | POA: Diagnosis not present

## 2018-11-11 DIAGNOSIS — I10 Essential (primary) hypertension: Secondary | ICD-10-CM | POA: Diagnosis not present

## 2018-11-11 DIAGNOSIS — I25118 Atherosclerotic heart disease of native coronary artery with other forms of angina pectoris: Secondary | ICD-10-CM | POA: Diagnosis not present

## 2018-11-11 NOTE — Patient Instructions (Signed)

## 2018-11-11 NOTE — Telephone Encounter (Signed)
Spoke with patients wife per release form and reviewed restrictions currently in place with COVID and entrance closure along with Dr. Mariah Milling recommendations to do virtual visit to reduce exposure. Reviewed with her that he would send text message to the number listed at his appointment time and if the video was not possible then he would switch over to telephone. She was agreeable with this plan and had no further questions at this time. Consent reviewed and approved for this visit.   YOUR CARDIOLOGY TEAM HAS ARRANGED FOR AN E-VISIT FOR YOUR APPOINTMENT - PLEASE REVIEW IMPORTANT INFORMATION BELOW SEVERAL DAYS PRIOR TO YOUR APPOINTMENT  Due to the recent COVID-19 pandemic, we are transitioning in-person office visits to tele-medicine visits in an effort to decrease unnecessary exposure to our patients, their families, and staff. These visits are billed to your insurance just like a normal visit is. We also encourage you to sign up for MyChart if you have not already done so. You will need a smartphone if possible. For patients that do not have this, we can still complete the visit using a regular telephone but do prefer a smartphone to enable video when possible. You may have a family member that lives with you that can help. If possible, we also ask that you have a blood pressure cuff and scale at home to measure your blood pressure, heart rate and weight prior to your scheduled appointment. Patients with clinical needs that need an in-person evaluation and testing will still be able to come to the office if absolutely necessary. If you have any questions, feel free to call our office.   CONSENT FOR TELE-HEALTH VISIT - PLEASE REVIEW  I hereby voluntarily request, consent and authorize [facility] and its employed or contracted physicians, physician assistants, nurse practitioners or other licensed health care professionals (the Practitioner), to provide me with telemedicine health care services (the  "Services") as deemed necessary by the treating Practitioner. I acknowledge and consent to receive the Services by the Practitioner via telemedicine. I understand that the telemedicine visit will involve communicating with the Practitioner through live audiovisual communication technology and the disclosure of certain medical information by electronic transmission. I acknowledge that I have been given the opportunity to request an in-person assessment or other available alternative prior to the telemedicine visit and am voluntarily participating in the telemedicine visit.  I understand that I have the right to withhold or withdraw my consent to the use of telemedicine in the course of my care at any time, without affecting my right to future care or treatment, and that the Practitioner or I may terminate the telemedicine visit at any time. I understand that I have the right to inspect all information obtained and/or recorded in the course of the telemedicine visit and may receive copies of available information for a reasonable fee.  I understand that some of the potential risks of receiving the Services via telemedicine include:  Marland Kitchen Delay or interruption in medical evaluation due to technological equipment failure or disruption; . Information transmitted may not be sufficient (e.g. poor resolution of images) to allow for appropriate medical decision making by the Practitioner; and/or  . In rare instances, security protocols could fail, causing a breach of personal health information.  Furthermore, I acknowledge that it is my responsibility to provide information about my medical history, conditions and care that is complete and accurate to the best of my ability. I acknowledge that Practitioner's advice, recommendations, and/or decision may be based on factors not  within their control, such as incomplete or inaccurate data provided by me or distortions of diagnostic images or specimens that may result from  electronic transmissions. I understand that the practice of medicine is not an exact science and that Practitioner makes no warranties or guarantees regarding treatment outcomes. I acknowledge that I will receive a copy of this consent concurrently upon execution via email to the email address I last provided but may also request a printed copy by calling the office of [place].    I understand that my insurance will be billed for this visit.   I have read or had this consent read to me. . I understand the contents of this consent, which adequately explains the benefits and risks of the Services being provided via telemedicine.  . I have been provided ample opportunity to ask questions regarding this consent and the Services and have had my questions answered to my satisfaction. . I give my informed consent for the services to be provided through the use of telemedicine in my medical care  By participating in this telemedicine visit I agree to the above.

## 2019-02-25 ENCOUNTER — Other Ambulatory Visit: Payer: Self-pay | Admitting: Cardiovascular Disease

## 2019-03-23 ENCOUNTER — Other Ambulatory Visit: Payer: Self-pay | Admitting: Cardiovascular Disease

## 2019-03-23 NOTE — Telephone Encounter (Signed)
LMOV for patient to call back to verify how patient is currently taking Irbesartan.

## 2019-06-17 ENCOUNTER — Telehealth: Payer: Self-pay | Admitting: Cardiovascular Disease

## 2019-06-17 NOTE — Telephone Encounter (Signed)
   [facility] Pre-operative Risk Assessment    Request for surgical clearance:  1. What type of surgery is being performed?  EGD    2. When is this surgery scheduled? TBD  3. What type of clearance is required (medical clearance vs. Pharmacy clearance to hold med vs. Both)? BOTH  4. Are there any medications that need to be held prior to surgery and how long?PLAVIX please advise    5. Practice name and name of physician performing surgery?  [facility] - Dr. Drue Novel    6. What is your office phone number [phone]    7.   What is your office fax number [phone]   8.   Anesthesia type (None, local, MAC, general) ? propofol    Clarisse Gouge 06/17/2019, 4:15 PM  _________________________________________________________________   (provider comments below)

## 2019-06-18 NOTE — Telephone Encounter (Signed)
   Primary Cardiologist: Julien Nordmann, MD  Chart reviewed as part of pre-operative protocol coverage. Patient was contacted 06/18/2019 in reference to pre-operative risk assessment for pending surgery as outlined below.  Alex [name] was last seen on 11/11/2018 via a telemedicine visit with Dr. Mariah Milling. Spoke with the patient's wife, Artelia Laroche, as the patient has a history of dementia. She reports increased confusion due to increased stress with the death of a family member recently. Since his telemedicine visit, AMORE GRATER has done fine from a cardiac standpoint. [facility] go to the [facility] 2-3 times per week for water aerobics and walking outdoors most days, weather permitting. He has no complaints of chest pain or SOB with these activities. He can complete 4 METs without anginal complaints  Therefore, based on ACC/AHA guidelines, the patient would be at acceptable risk for the planned procedure without further cardiovascular testing.   As for the plavix, this is managed by his PCP due to history of CVA. Will defer recommendations for holding plavix to Dr. Hyacinth Meeker.  I will route this recommendation to the requesting party via Epic fax function and remove from pre-op pool.  Please call with questions.   Callback: - Please notify the requesting surgeons office that recommendations for holding plavix are deferred to the patient's PCP.    Beatriz Stallion, PA-C 06/18/2019, 9:09 AM

## 2019-06-18 NOTE — Telephone Encounter (Signed)
I called and left Becky with [facility] a message to call back about surgical clearance.

## 2019-06-22 ENCOUNTER — Other Ambulatory Visit: Payer: Self-pay | Admitting: Cardiovascular Disease

## 2019-06-23 NOTE — Telephone Encounter (Signed)
I re-faxed clearance notes to [facility] with [facility]. I also left message for Becky that clearance has been sent to her. If any questions please call [phone]. I will remove for the pre op call back pool.

## 2019-09-09 ENCOUNTER — Other Ambulatory Visit: Payer: Self-pay | Admitting: Cardiovascular Disease

## 2019-11-16 ENCOUNTER — Other Ambulatory Visit: Payer: Self-pay

## 2019-11-16 ENCOUNTER — Encounter: Payer: Self-pay | Admitting: Cardiology

## 2019-11-16 ENCOUNTER — Ambulatory Visit (INDEPENDENT_AMBULATORY_CARE_PROVIDER_SITE_OTHER): Payer: Medicare Other | Admitting: Cardiology

## 2019-11-16 ENCOUNTER — Telehealth: Payer: Self-pay | Admitting: Cardiovascular Disease

## 2019-11-16 ENCOUNTER — Ambulatory Visit: Payer: Medicare Other | Admitting: Cardiovascular Disease

## 2019-11-16 VITALS — BP 120/70 | HR 53 | Ht 69.0 in | Wt 163.4 lb

## 2019-11-16 DIAGNOSIS — I2510 Atherosclerotic heart disease of native coronary artery without angina pectoris: Secondary | ICD-10-CM | POA: Diagnosis not present

## 2019-11-16 DIAGNOSIS — R011 Cardiac murmur, unspecified: Secondary | ICD-10-CM

## 2019-11-16 DIAGNOSIS — I10 Essential (primary) hypertension: Secondary | ICD-10-CM

## 2019-11-16 DIAGNOSIS — I6523 Occlusion and stenosis of bilateral carotid arteries: Secondary | ICD-10-CM

## 2019-11-16 NOTE — Telephone Encounter (Signed)
Spoke with the patients wife.  Alex [name]  sts that the patient is allergic to contrast media and the paperwork AVS that the patient received after his visit today say that contrast may be used during the patients echo. The patient has a known allergy to contrast. She wanted to make sure that was noted before scheduling the recommended echo.  Advised the patient that the info she received is out dated. Contrast is not used during the echo but a image enhancer called definity is used and it is not a contrast agent. Adv the patients wife that definity is a different chemical make up and usually tolerated well.  Patient wife sts that she will call back to schedule the patients echo.

## 2019-11-16 NOTE — Telephone Encounter (Signed)
Patient wife wants to discuss image enhancing iv used during echo prior to scheduling.

## 2019-11-16 NOTE — Patient Instructions (Signed)
Medication Instructions:   Your physician recommends that you continue on your current medications as directed. Please refer to the Current Medication list given to you today.  *If you need a refill on your cardiac medications before your next appointment, please call your pharmacy*   Lab Work: None Ordered If you have labs (blood work) drawn today and your tests are completely normal, you will receive your results only by: Marland Kitchen MyChart Message (if you have MyChart) OR . A paper copy in the mail If you have any lab test that is abnormal or we need to change your treatment, we will call you to review the results.   Testing/Procedures:  Your physician has requested that you have an echocardiogram. Echocardiography is a painless test that uses sound waves to create images of your heart. It provides your doctor with information about the size and shape of your heart and how well your heart's chambers and valves are working. This procedure takes approximately one hour. There are no restrictions for this procedure.     Follow-Up: At [facility], you and your health needs are our priority.  As part of our continuing mission to provide you with exceptional heart care, we have created designated Provider Care Teams.  These Care Teams include your primary Cardiologist (physician) and Advanced Practice Providers (APPs -  Physician Assistants and Nurse Practitioners) who all work together to provide you with the care you need, when you need it.  We recommend signing up for the patient portal called "MyChart".  Sign up information is provided on this After Visit Summary.  MyChart is used to connect with patients for Virtual Visits (Telemedicine).  Patients are able to view lab/test results, encounter notes, upcoming appointments, etc.  Non-urgent messages can be sent to your provider as well.   To learn more about what you can do with MyChart, go to [email or web address].    Your next appointment:     4 weeks, after echo results  The format for your next appointment:   Virtual Visit   Provider:   Julien Nordmann, MD   Other Instructions   Echocardiogram An echocardiogram is a procedure that uses painless sound waves (ultrasound) to produce an image of the heart. Images from an echocardiogram can provide important information about:  Signs of coronary artery disease (CAD).  Aneurysm detection. An aneurysm is a weak or damaged part of an artery wall that bulges out from the normal force of blood pumping through the body.  Heart size and shape. Changes in the size or shape of the heart can be associated with certain conditions, including heart failure, aneurysm, and CAD.  Heart muscle function.  Heart valve function.  Signs of a past heart attack.  Fluid buildup around the heart.  Thickening of the heart muscle.  A tumor or infectious growth around the heart valves. Tell a health care provider about:  Any allergies you have.  All medicines you are taking, including vitamins, herbs, eye drops, creams, and over-the-counter medicines.  Any blood disorders you have.  Any surgeries you have had.  Any medical conditions you have.  Whether you are pregnant or may be pregnant. What are the risks? Generally, this is a safe procedure. However, problems may occur, including:  Allergic reaction to dye (contrast) that may be used during the procedure. What happens before the procedure? No specific preparation is needed. You may eat and drink normally. What happens during the procedure?   An IV tube may be inserted into  one of your veins.  You may receive contrast through this tube. A contrast is an injection that improves the quality of the pictures from your heart.  A gel will be applied to your chest.  A wand-like tool (transducer) will be moved over your chest. The gel will help to transmit the sound waves from the transducer.  The sound waves will harmlessly  bounce off of your heart to allow the heart images to be captured in real-time motion. The images will be recorded on a computer. The procedure may vary among health care providers and hospitals. What happens after the procedure?  You may return to your normal, everyday life, including diet, activities, and medicines, unless your health care provider tells you not to do that. Summary  An echocardiogram is a procedure that uses painless sound waves (ultrasound) to produce an image of the heart.  Images from an echocardiogram can provide important information about the size and shape of your heart, heart muscle function, heart valve function, and fluid buildup around your heart.  You do not need to do anything to prepare before this procedure. You may eat and drink normally.  After the echocardiogram is completed, you may return to your normal, everyday life, unless your health care provider tells you not to do that. This information is not intended to replace advice given to you by your health care provider. Make sure you discuss any questions you have with your health care provider. Document Revised: 09/18/2018 Document Reviewed: 06/30/2016 Elsevier Patient Education  Comfort.

## 2019-11-16 NOTE — Progress Notes (Signed)
Cardiology Office Note:    Date:  11/16/2019   ID:  Alex [name], DOB [birth year], MRN [id number]  PCP:  Rusty Aus, MD  [facility] Cardiologist:  Alex Rogue, MD  [place] Electrophysiologist:  None   Referring MD: Rusty Aus, MD   Chief Complaint  Patient presents with  . OTHER    1 yr f/u no complaints today. Pt would like to know if it would be cheaper to have actual Irbesartan 150 mg tablet instead of cutting 300mg  in half.Meds reviewed verbally with pt.    History of Present Illness:    Alex [name] is a 86 y.o. male with a hx of CAD/CABG in 1996, [place] 2016, pretension, hyperlipidemia, dementia who presents for 1 year follow-up due to history of CAD.  Patient feels well, denies any chest pain at rest or with exertion.  They live [place] miles out of [facility] and usually come to [facility] 3 times a week to exercise in the pool at the [facility].  Last left heart cath in 2010 showed patent grafts.  Lexiscan Myoview in 2014 with no evidence for ischemia, low risk scan.  Patient feels well and has no issues or concerns at this time.  Past Medical History:  Diagnosis Date  . Arthritis   . CAD (coronary artery disease)    post CABG; myoview 2009 neg  . Carotid artery disease (HCC)    Mild  . Cirrhosis of liver ([place])   . Dementia ([place])   . GERD (gastroesophageal reflux disease)   . Hepatitis C    Inactive  . Hyperlipidemia   . Hypertension   . Idiopathic thrombocytopenia ([place])   . Memory loss   . Myocardial infarction [facility]) [place]  . RBBB (right bundle branch block)   . Right bundle branch block (RBBB)   . Right tibial fracture 2014  . Stroke [facility]) February 2016   [facility]  . Thrombocytopenia ([place])   . TIA (transient ischemic attack)   . TIA (transient ischemic attack)     Past Surgical History:  Procedure Laterality Date  . CORONARY ARTERY BYPASS GRAFT    . EYE SURGERY Bilateral    Cataract Extraction  . FRACTURE SURGERY Right  1981   Rod in right femur  . JOINT REPLACEMENT    . LEG SURGERY  2014   right leg  . PARTIAL KNEE ARTHROPLASTY Right 06/14/2015   Procedure: UNICOMPARTMENTAL KNEE;  Surgeon: Corky Mull, MD;  Location: [facility] ORS;  Service: Orthopedics;  Laterality: Right;  . TIBIA FRACTURE SURGERY Right 2014   Rod in tibia  . UPPER GASTROINTESTINAL ENDOSCOPY      Current Medications: Current Meds  Medication Sig  . aspirin 81 MG tablet Take 81 mg by mouth daily.  . clopidogrel (PLAVIX) 75 MG tablet Take 75 mg by mouth daily.  . Cyanocobalamin (B-12 PO) Take 1,000 mg by mouth daily.   Marland Kitchen galantamine (RAZADYNE) 8 MG tablet Take 12 mg by mouth 2 (two) times daily.   . irbesartan (AVAPRO) 300 MG tablet Take 0.5 tablets (150 mg total) by mouth daily.  Marland Kitchen levothyroxine (SYNTHROID, LEVOTHROID) 75 MCG tablet Take 75 mcg by mouth daily.  . Memantine HCl ER (NAMENDA XR) 21 MG CP24 Take 21 mg by mouth daily.   . Multiple Vitamins-Minerals (CENTRUM SILVER) tablet Take 1 tablet by mouth daily.    . nitroGLYCERIN (NITROSTAT) 0.4 MG SL tablet Place 1 tablet (0.4 mg total) under the  tongue as directed.  Marland Kitchen omeprazole (PRILOSEC) 40 MG capsule Take 40 mg by mouth daily.  . sertraline (ZOLOFT) 50 MG tablet Take 50 mg by mouth daily.   . simvastatin (ZOCOR) 40 MG tablet TAKE ONE TABLET BY MOUTH AT BEDTIME     Allergies:   Aspirin, Aspirin-dipyridamole er, Aspirin-dipyridamole er, Ciprofloxacin, Contrast media [iodinated diagnostic agents], Morphine, Other, and Venlafaxine   Social History   Socioeconomic History  . Marital status: Married    Spouse name: Not on file  . Number of children: Not on file  . Years of education: Not on file  . Highest education level: Not on file  Occupational History  . Occupation: Retired  Tobacco Use  . Smoking status: Never Smoker  . Smokeless tobacco: Never Used  Substance and Sexual Activity  . Alcohol use: No  . Drug use: No  . Sexual activity: Not on file  Other Topics  Concern  . Not on file  Social History Narrative   Married   Social Determinants of Health   Financial Resource Strain:   . Difficulty of Paying Living Expenses:   Food Insecurity:   . Worried About Programme researcher, broadcasting/film/video in the Last Year:   . Barista in the Last Year:   Transportation Needs:   . Freight forwarder (Medical):   Marland Kitchen Lack of Transportation (Non-Medical):   Physical Activity:   . Days of Exercise per Week:   . Minutes of Exercise per Session:   Stress:   . Feeling of Stress :   Social Connections:   . Frequency of Communication with Friends and Family:   . Frequency of Social Gatherings with Friends and Family:   . Attends Religious Services:   . Active Member of Clubs or Organizations:   . Attends Banker Meetings:   Marland Kitchen Marital Status:      Family History: The patient's family history is negative for Diabetes and Coronary artery disease.  ROS:   Please see the history of present illness.     All other systems reviewed and are negative.  EKGs/Labs/Other Studies Reviewed:    The following studies were reviewed today:   EKG:  EKG is  ordered today.  The ekg ordered today demonstrates sinus bradycardia, right bundle branch block, left anterior hemiblock.  Recent Labs: No results found for requested labs within last 8760 hours.  Recent Lipid Panel    Component Value Date/Time   CHOL 157 08/20/2015 0609   CHOL 153 04/26/2013 0432   TRIG 185 (H) 08/20/2015 0609   TRIG 104 04/26/2013 0432   TRIG 54 08/08/2009 0000   HDL 34 (L) 08/20/2015 0609   HDL 36 (L) 04/26/2013 0432   CHOLHDL 4.6 08/20/2015 0609   VLDL 37 08/20/2015 0609   VLDL 21 04/26/2013 0432   LDLCALC 86 08/20/2015 0609   LDLCALC 96 04/26/2013 0432   LDLDIRECT 39.7 08/08/2009 0000    Physical Exam:    VS:  BP 120/70 (BP Location: Left Arm, Patient Position: Sitting, Cuff Size: Normal)   Pulse (!) 53   Ht 5\' 9"  (1.753 m)   Wt 163 lb 6 oz (74.1 kg)   SpO2 94%    BMI 24.13 kg/m     Wt Readings from Last 3 Encounters:  11/16/19 163 lb 6 oz (74.1 kg)  11/11/17 160 lb 8 oz (72.8 kg)  10/29/16 165 lb 4 oz (75 kg)     GEN:  Well nourished, well developed in  no acute distress HEENT: Normal NECK: No JVD; No carotid bruits LYMPHATICS: No lymphadenopathy CARDIAC: RRR, 2/6 systolic murmur, rubs, gallops RESPIRATORY:  Clear to auscultation without rales, wheezing or rhonchi  ABDOMEN: Soft, non-tender, non-distended MUSCULOSKELETAL:  No edema; No deformity  SKIN: Warm and dry NEUROLOGIC:  Alert and oriented x 3 PSYCHIATRIC:  Normal affect   ASSESSMENT:    1. Coronary artery disease involving native coronary artery of native heart without angina pectoris   2. Systolic murmur   3. Essential hypertension   4. Bilateral carotid artery stenosis    PLAN:    In order of problems listed above:  1. Patient with history of CAD/CABG, denies any current symptoms of chest pain or shortness of breath.  Last Myoview with no evidence of ischemia.  Continue aspirin, statin as prescribed. 2. Systolic murmur noted on exam.  No echocardiogram noted in the EMR.  Get echocardiogram to evaluate for any valvular function and also ejection fraction in light of CAD CABG. 3. History of hypertension, BP well controlled.  Continue Avapro as prescribed. 4. Patient with carotid artery disease.  History of CVA.  On aspirin, Plavix.  Continue.  Continue statin.  Follow-up after echocardiogram with Dr. Mariah Milling and or PA.  Patient lives 12 miles from [place].  If follow-up is needed after echocardiogram, virtual visit will be planned if possible.   Medication Adjustments/Labs and Tests Ordered: Current medicines are reviewed at length with the patient today.  Concerns regarding medicines are outlined above.  No orders of the defined types were placed in this encounter.  No orders of the defined types were placed in this encounter.   Patient Instructions  Medication  Instructions:   Your physician recommends that you continue on your current medications as directed. Please refer to the Current Medication list given to you today.  *If you need a refill on your cardiac medications before your next appointment, please call your pharmacy*   Lab Work: None Ordered If you have labs (blood work) drawn today and your tests are completely normal, you will receive your results only by: Marland Kitchen MyChart Message (if you have MyChart) OR . A paper copy in the mail If you have any lab test that is abnormal or we need to change your treatment, we will call you to review the results.   Testing/Procedures:  Your physician has requested that you have an echocardiogram. Echocardiography is a painless test that uses sound waves to create images of your heart. It provides your doctor with information about the size and shape of your heart and how well your heart's chambers and valves are working. This procedure takes approximately one hour. There are no restrictions for this procedure.     Follow-Up: At [facility], you and your health needs are our priority.  As part of our continuing mission to provide you with exceptional heart care, we have created designated Provider Care Teams.  These Care Teams include your primary Cardiologist (physician) and Advanced Practice Providers (APPs -  Physician Assistants and Nurse Practitioners) who all work together to provide you with the care you need, when you need it.  We recommend signing up for the patient portal called "MyChart".  Sign up information is provided on this After Visit Summary.  MyChart is used to connect with patients for Virtual Visits (Telemedicine).  Patients are able to view lab/test results, encounter notes, upcoming appointments, etc.  Non-urgent messages can be sent to your provider as well.   To learn  more about what you can do with MyChart, go to [email or web address].    Your next appointment:   4 weeks,  after echo results  The format for your next appointment:   Virtual Visit   Provider:   Julien Nordmann, MD   Other Instructions   Echocardiogram An echocardiogram is a procedure that uses painless sound waves (ultrasound) to produce an image of the heart. Images from an echocardiogram can provide important information about:  Signs of coronary artery disease (CAD).  Aneurysm detection. An aneurysm is a weak or damaged part of an artery wall that bulges out from the normal force of blood pumping through the body.  Heart size and shape. Changes in the size or shape of the heart can be associated with certain conditions, including heart failure, aneurysm, and CAD.  Heart muscle function.  Heart valve function.  Signs of a past heart attack.  Fluid buildup around the heart.  Thickening of the heart muscle.  A tumor or infectious growth around the heart valves. Tell a health care provider about:  Any allergies you have.  All medicines you are taking, including vitamins, herbs, eye drops, creams, and over-the-counter medicines.  Any blood disorders you have.  Any surgeries you have had.  Any medical conditions you have.  Whether you are pregnant or may be pregnant. What are the risks? Generally, this is a safe procedure. However, problems may occur, including:  Allergic reaction to dye (contrast) that may be used during the procedure. What happens before the procedure? No specific preparation is needed. You may eat and drink normally. What happens during the procedure?   An IV tube may be inserted into one of your veins.  You may receive contrast through this tube. A contrast is an injection that improves the quality of the pictures from your heart.  A gel will be applied to your chest.  A wand-like tool (transducer) will be moved over your chest. The gel will help to transmit the sound waves from the transducer.  The sound waves will harmlessly bounce off of  your heart to allow the heart images to be captured in real-time motion. The images will be recorded on a computer. The procedure may vary among health care providers and hospitals. What happens after the procedure?  You may return to your normal, everyday life, including diet, activities, and medicines, unless your health care provider tells you not to do that. Summary  An echocardiogram is a procedure that uses painless sound waves (ultrasound) to produce an image of the heart.  Images from an echocardiogram can provide important information about the size and shape of your heart, heart muscle function, heart valve function, and fluid buildup around your heart.  You do not need to do anything to prepare before this procedure. You may eat and drink normally.  After the echocardiogram is completed, you may return to your normal, everyday life, unless your health care provider tells you not to do that. This information is not intended to replace advice given to you by your health care provider. Make sure you discuss any questions you have with your health care provider. Document Revised: 09/18/2018 Document Reviewed: 06/30/2016 Elsevier Patient Education  2020 [employer].      Signed, Debbe Odea, MD  11/16/2019 12:50 PM    [facility]

## 2019-11-18 NOTE — Addendum Note (Signed)
Addended by: Kendrick Fries on: 11/18/2019 12:54 PM   Modules accepted: Orders

## 2019-12-23 ENCOUNTER — Ambulatory Visit (INDEPENDENT_AMBULATORY_CARE_PROVIDER_SITE_OTHER): Payer: Medicare Other

## 2019-12-23 ENCOUNTER — Other Ambulatory Visit: Payer: Self-pay

## 2019-12-23 DIAGNOSIS — R011 Cardiac murmur, unspecified: Secondary | ICD-10-CM | POA: Diagnosis not present

## 2019-12-23 MED ORDER — PERFLUTREN LIPID MICROSPHERE
1.0000 mL | INTRAVENOUS | Status: AC | PRN
  Administered 2019-12-23: 2 mL via INTRAVENOUS

## 2020-02-01 ENCOUNTER — Ambulatory Visit: Payer: Medicare Other | Admitting: Cardiovascular Disease
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
## 2020-02-03 ENCOUNTER — Ambulatory Visit (INDEPENDENT_AMBULATORY_CARE_PROVIDER_SITE_OTHER): Payer: Medicare Other | Admitting: Cardiovascular Disease

## 2020-02-03 ENCOUNTER — Encounter: Payer: Self-pay | Admitting: Cardiovascular Disease

## 2020-02-03 ENCOUNTER — Other Ambulatory Visit: Payer: Self-pay

## 2020-02-03 VITALS — BP 130/62 | HR 62 | Ht 69.0 in | Wt 166.0 lb

## 2020-02-03 DIAGNOSIS — I6523 Occlusion and stenosis of bilateral carotid arteries: Secondary | ICD-10-CM | POA: Diagnosis not present

## 2020-02-03 DIAGNOSIS — I25118 Atherosclerotic heart disease of native coronary artery with other forms of angina pectoris: Secondary | ICD-10-CM | POA: Diagnosis not present

## 2020-02-03 DIAGNOSIS — G309 Alzheimer's disease, unspecified: Secondary | ICD-10-CM

## 2020-02-03 DIAGNOSIS — E7800 Pure hypercholesterolemia, unspecified: Secondary | ICD-10-CM | POA: Diagnosis not present

## 2020-02-03 DIAGNOSIS — I10 Essential (primary) hypertension: Secondary | ICD-10-CM

## 2020-02-03 DIAGNOSIS — F0280 Dementia in other diseases classified elsewhere without behavioral disturbance: Secondary | ICD-10-CM

## 2020-02-03 MED ORDER — EZETIMIBE 10 MG PO TABS: 10.0000 mg | ORAL_TABLET | Freq: Every day | ORAL | 3 refills | Status: DC

## 2020-02-03 NOTE — Patient Instructions (Addendum)
Medication Instructions:  Zetia 10 mg daily once a day for cholesterol  If you need a refill on your cardiac medications before your next appointment, please call your pharmacy.    Lab work: No new labs needed   If you have labs (blood work) drawn today and your tests are completely normal, you will receive your results only by: Marland Kitchen MyChart Message (if you have MyChart) OR . A paper copy in the mail If you have any lab test that is abnormal or we need to change your treatment, we will call you to review the results.   Testing/Procedures: No new testing needed   Follow-Up: At [facility], you and your health needs are our priority.  As part of our continuing mission to provide you with exceptional heart care, we have created designated Provider Care Teams.  These Care Teams include your primary Cardiologist (physician) and Advanced Practice Providers (APPs -  Physician Assistants and Nurse Practitioners) who all work together to provide you with the care you need, when you need it.  . You will need a follow up appointment in 12 months  . Providers on your designated Care Team:   . Nicolasa Ducking, NP . Eula Listen, PA-C . Marisue Ivan, PA-C  Any Other Special Instructions Will Be Listed Below (If Applicable).  COVID-19 Vaccine Information can be found at: PodExchange.[email or web address] For questions related to vaccine distribution or appointments, please email [email or web address] or call [phone].

## 2020-03-15 ENCOUNTER — Ambulatory Visit: Payer: Medicare Other | Admitting: Dermatology

## 2020-04-19 ENCOUNTER — Emergency Department
Admission: EM | Admit: 2020-04-19 | Discharge: 2020-04-20 | Disposition: A | Discharge status: Home / Self Care | Payer: Medicare Other | Attending: Emergency Medicine | Admitting: Emergency Medicine

## 2020-04-19 DIAGNOSIS — I10 Essential (primary) hypertension: Secondary | ICD-10-CM | POA: Insufficient documentation

## 2020-04-19 DIAGNOSIS — S0101XA Laceration without foreign body of scalp, initial encounter: Secondary | ICD-10-CM | POA: Diagnosis not present

## 2020-04-19 DIAGNOSIS — S0990XA Unspecified injury of head, initial encounter: Secondary | ICD-10-CM | POA: Diagnosis present

## 2020-04-19 DIAGNOSIS — W19XXXA Unspecified fall, initial encounter: Secondary | ICD-10-CM | POA: Insufficient documentation

## 2020-04-19 DIAGNOSIS — I2510 Atherosclerotic heart disease of native coronary artery without angina pectoris: Secondary | ICD-10-CM | POA: Diagnosis not present

## 2020-04-19 DIAGNOSIS — Z7901 Long term (current) use of anticoagulants: Secondary | ICD-10-CM | POA: Insufficient documentation

## 2020-04-19 DIAGNOSIS — F0390 Unspecified dementia without behavioral disturbance: Secondary | ICD-10-CM | POA: Diagnosis not present

## 2020-04-19 DIAGNOSIS — Z96651 Presence of right artificial knee joint: Secondary | ICD-10-CM | POA: Diagnosis not present

## 2020-04-19 DIAGNOSIS — Z7982 Long term (current) use of aspirin: Secondary | ICD-10-CM | POA: Insufficient documentation

## 2020-04-19 DIAGNOSIS — Z951 Presence of aortocoronary bypass graft: Secondary | ICD-10-CM | POA: Insufficient documentation

## 2020-04-19 DIAGNOSIS — S064X0A Epidural hemorrhage without loss of consciousness, initial encounter: Secondary | ICD-10-CM | POA: Diagnosis not present

## 2020-04-19 DIAGNOSIS — Z79899 Other long term (current) drug therapy: Secondary | ICD-10-CM | POA: Insufficient documentation

## 2020-04-19 DIAGNOSIS — Z20822 Contact with and (suspected) exposure to covid-19: Secondary | ICD-10-CM | POA: Diagnosis not present

## 2020-04-19 DIAGNOSIS — Z7902 Long term (current) use of antithrombotics/antiplatelets: Secondary | ICD-10-CM

## 2020-04-19 NOTE — ED Triage Notes (Signed)
Pt to ED via EMS from home c/o fall tonight, per EMS it was mechanical fall but pt unsure why he fell.  Pt with bandage in place, EMS states approx 5in laceration to posterior right head.  Pt on plavix and ASA, denies LOC.  EMS EKG showed RBBB with hx of MI and dementia.  EMS CBG 144 and other VSS.  Presents alert and oriented to place and name.

## 2020-04-20 ENCOUNTER — Other Ambulatory Visit: Payer: Self-pay

## 2020-04-20 ENCOUNTER — Emergency Department: Payer: Medicare Other

## 2020-04-20 ENCOUNTER — Encounter: Payer: Self-pay | Admitting: Emergency Medicine

## 2020-04-20 DIAGNOSIS — S0101XA Laceration without foreign body of scalp, initial encounter: Secondary | ICD-10-CM | POA: Diagnosis not present

## 2020-04-20 LAB — BASIC METABOLIC PANEL
Anion gap: 9 (ref 5–15)
BUN: 17 mg/dL (ref 8–23)
CO2: 27 mmol/L (ref 22–32)
Calcium: 8.6 mg/dL — ABNORMAL LOW (ref 8.9–10.3)
Chloride: 102 mmol/L (ref 98–111)
Creatinine, Ser: 1.05 mg/dL (ref 0.61–1.24)
GFR, Estimated: >60 mL/min (ref >60)
Glucose, Bld: 125 mg/dL — ABNORMAL HIGH (ref 70–99)
Potassium: 3.9 mmol/L (ref 3.5–5.1)
Sodium: 138 mmol/L (ref 135–145)

## 2020-04-20 LAB — RESPIRATORY PANEL BY RT PCR (FLU A&B, COVID)
Influenza A by PCR: NEGATIVE
Influenza B by PCR: NEGATIVE
SARS Coronavirus 2 by RT PCR: NEGATIVE

## 2020-04-20 LAB — URINALYSIS, COMPLETE (UACMP) WITH MICROSCOPIC
Bacteria, UA: NONE SEEN
Bilirubin Urine: NEGATIVE
Glucose, UA: NEGATIVE mg/dL
Hgb urine dipstick: NEGATIVE
Ketones, ur: NEGATIVE mg/dL
Leukocytes,Ua: NEGATIVE
Nitrite: NEGATIVE
Protein, ur: NEGATIVE mg/dL
Specific Gravity, Urine: 1.012 (ref 1.005–1.030)
Squamous Epithelial / HPF: NONE SEEN (ref 0–5)
pH: 7 (ref 5.0–8.0)

## 2020-04-20 LAB — PROTIME-INR
INR: 1.1 (ref 0.8–1.2)
Prothrombin Time: 13.6 seconds (ref 11.4–15.2)

## 2020-04-20 LAB — CBC
HCT: 36.8 % — ABNORMAL LOW (ref 39.0–52.0)
Hemoglobin: 12.6 g/dL — ABNORMAL LOW (ref 13.0–17.0)
MCH: 32.1 pg (ref 26.0–34.0)
MCHC: 34.2 g/dL (ref 30.0–36.0)
MCV: 93.6 fL (ref 80.0–100.0)
Platelets: 91 10*3/uL — ABNORMAL LOW (ref 150–400)
RBC: 3.93 MIL/uL — ABNORMAL LOW (ref 4.22–5.81)
RDW: 14.8 % (ref 11.5–15.5)
WBC: 5.2 10*3/uL (ref 4.0–10.5)
nRBC: 0 % (ref 0.0–0.2)

## 2020-04-20 MED ORDER — LIDOCAINE-EPINEPHRINE 2 %-1:100000 IJ SOLN
20.0000 mL | Freq: Once | INTRAMUSCULAR | Status: AC
  Administered 2020-04-20: 20 mL via INTRADERMAL
  Filled 2020-04-20: qty 1

## 2020-04-20 NOTE — ED Provider Notes (Addendum)
[facility] Emergency Department Provider Note  ____________________________________________   First MD Initiated Contact with Patient 04/20/20 0015     (approximate)  I have reviewed the triage vital signs and the nursing notes.   HISTORY  Chief Complaint Fall  Level 5 caveat:  history/ROS limited by chronic dementia  HPI Alex [name] is a 86 y.o. male with medical history as listed below which includes CAD on dual antiplatelet therapy as well as chronic dementia.  His wife is at bedside and provides majority of the history including confirming the dementia diagnosis.  She states that he got up during the night to go to the bathroom and did not turn on the light.  He tripped over something and struck the left side of his head against what they believe was the corner of a cabinet or dresser.  He had substantial bleeding and an obvious laceration to his scalp.  He did not lose consciousness.  He reports some pain in the left side of his head but otherwise denies any symptoms.  He does not remember exactly what happened.  He has had no nausea nor vomiting.  He denies any numbness nor weakness.  He is in no respiratory distress and has no chest pain or abdominal pain.  He did not sustain any injuries to his arms or his legs.  The bleeding from the scalp wound was extensive and a large pressure dressing was placed by EMS.  He was sent almost immediately to CT scan upon arrival.         Past Medical History:  Diagnosis Date  . Arthritis   . CAD (coronary artery disease)    post CABG; myoview 2009 neg  . Carotid artery disease (HCC)    Mild  . Cirrhosis of liver (HCC)   . Dementia (HCC)   . GERD (gastroesophageal reflux disease)   . Hepatitis C    Inactive  . Hyperlipidemia   . Hypertension   . Idiopathic thrombocytopenia (HCC)   . Memory loss   . Myocardial infarction [facility]) 1996   [place]  . RBBB (right bundle branch block)   . Right bundle branch  block (RBBB)   . Right tibial fracture 2014  . Stroke [facility]) February 2016   [facility]  . Thrombocytopenia (HCC)   . TIA (transient ischemic attack)   . TIA (transient ischemic attack)     Patient Active Problem List   Diagnosis Date Noted  . Angina pectoris (HCC)   . Pain in the chest   . History of stroke   . Essential hypertension   . Status post right partial knee replacement 06/14/2015  . CVA (cerebral vascular accident) (HCC) 11/05/2014  . History of recent fall 05/10/2014  . Broken nose 05/10/2014  . Malaise 01/07/2012  . Dizziness 12/15/2010  . Carotid stenosis 07/04/2009  . PALPITATIONS 03/23/2009  . Hyperlipidemia 12/26/2008  . HYPERTENSION, BENIGN 12/26/2008  . CAD, ARTERY BYPASS GRAFT 12/26/2008    Past Surgical History:  Procedure Laterality Date  . CORONARY ARTERY BYPASS GRAFT    . EYE SURGERY Bilateral    Cataract Extraction  . FRACTURE SURGERY Right 1981   Rod in right femur  . JOINT REPLACEMENT    . LEG SURGERY  2014   right leg  . PARTIAL KNEE ARTHROPLASTY Right 06/14/2015   Procedure: UNICOMPARTMENTAL KNEE;  Surgeon: Christena Flake, MD;  Location: [facility] ORS;  Service: Orthopedics;  Laterality: Right;  . TIBIA FRACTURE SURGERY Right 2014  Rod in tibia  . UPPER GASTROINTESTINAL ENDOSCOPY      Prior to Admission medications   Medication Sig Start Date End Date Taking? Authorizing Provider  aspirin 81 MG tablet Take 81 mg by mouth daily.    [provider]  clopidogrel (PLAVIX) 75 MG tablet Take 75 mg by mouth daily.    [provider]  Cyanocobalamin (B-12 PO) Take 1,000 mg by mouth daily.     [provider]  ezetimibe (ZETIA) 10 MG tablet Take 1 tablet (10 mg total) by mouth daily. 02/03/20 05/03/20  Antonieta IbaGollan, Timothy J, MD  galantamine (RAZADYNE) 8 MG tablet Take 12 mg by mouth 2 (two) times daily.  02/26/13   [provider]  irbesartan (AVAPRO) 300 MG tablet Take 0.5 tablets (150 mg total) by mouth daily. 03/23/19    Antonieta IbaGollan, Timothy J, MD  levothyroxine (SYNTHROID, LEVOTHROID) 75 MCG tablet Take 75 mcg by mouth daily. 12/21/16   [provider]  Memantine HCl ER (NAMENDA XR) 21 MG CP24 Take 21 mg by mouth daily.  04/13/15   [provider]  Multiple Vitamins-Minerals (CENTRUM SILVER) tablet Take 1 tablet by mouth daily.      [provider]  nitroGLYCERIN (NITROSTAT) 0.4 MG SL tablet Place 1 tablet (0.4 mg total) under the tongue as directed. 10/23/16   Antonieta IbaGollan, Timothy J, MD  omeprazole (PRILOSEC) 40 MG capsule Take 40 mg by mouth daily. 10/08/17   [provider]  sertraline (ZOLOFT) 50 MG tablet Take 50 mg by mouth daily.  03/11/15   [provider]  simvastatin (ZOCOR) 40 MG tablet TAKE ONE TABLET BY MOUTH AT BEDTIME 09/09/19   Antonieta IbaGollan, Timothy J, MD    Allergies Aspirin, Aspirin-dipyridamole er, Aspirin-dipyridamole er, Ciprofloxacin, Contrast media [iodinated diagnostic agents], Morphine, Other, and Venlafaxine  Family History  Problem Relation Age of Onset  . Diabetes Neg Hx   . Coronary artery disease Neg Hx     Social History Social History   Tobacco Use  . Smoking status: Never Smoker  . Smokeless tobacco: Never Used  Substance Use Topics  . Alcohol use: No  . Drug use: No    Review of Systems Level 5 caveat:  history/ROS limited by chronic dementia   ____________________________________________   PHYSICAL EXAM:  VITAL SIGNS: ED Triage Vitals  Enc Vitals Group     BP 04/19/20 2359 (!) 128/96     Pulse Rate 04/19/20 2359 70     Resp 04/19/20 2359 20     Temp 04/19/20 2359 98.1 F (36.7 C)     Temp Source 04/19/20 2359 Oral     SpO2 04/19/20 2359 100 %     Weight 04/20/20 0000 75 kg (165 lb 5.5 oz)     Height 04/20/20 0000 1.753 m (5\' 9" )     Head Circumference --      Peak Flow --      Pain Score 04/19/20 2359 3     Pain Loc --      Pain Edu? --      Excl. in GC? --     Constitutional: Alert and oriented to self. Eyes:  Conjunctivae are normal.  Pupils are equal and reactive, extraocular motion is intact and normal. Head: 11-cm elliptical laceration to the mid scalp/crown.  Bleeding relatively well controlled.  No other injuries are evident. Nose: No congestion/rhinnorhea. Mouth/Throat: Patient is wearing a mask. Neck: No stridor.  No meningeal signs.   Cardiovascular: Normal rate, regular rhythm. Good peripheral  circulation. Grossly normal heart sounds. Respiratory: Normal respiratory effort.  No retractions. Gastrointestinal: Soft and nontender. No distention.  Musculoskeletal: No lower extremity tenderness nor edema. No gross deformities of extremities. Neurologic:  Normal speech and language. No gross focal neurologic deficits are appreciated.  Skin:  Skin is warm, dry and intact except for scalp laceration. Psychiatric: Mood and affect are normal for his diagnosis of dementia.  He is not anxious and in no distress.  ____________________________________________   LABS (all labs ordered are listed, but only abnormal results are displayed)  Labs Reviewed  BASIC METABOLIC PANEL - Abnormal; Notable for the following components:      Result Value   Glucose, Bld 125 (*)    Calcium 8.6 (*)    All other components within normal limits  CBC - Abnormal; Notable for the following components:   RBC 3.93 (*)    Hemoglobin 12.6 (*)    HCT 36.8 (*)    Platelets 91 (*)    All other components within normal limits  URINALYSIS, COMPLETE (UACMP) WITH MICROSCOPIC - Abnormal; Notable for the following components:   Color, Urine YELLOW (*)    APPearance CLEAR (*)    All other components within normal limits  RESPIRATORY PANEL BY RT PCR (FLU A&B, COVID)  PROTIME-INR   ____________________________________________  EKG  ED ECG REPORT I, Loleta Rose, the attending physician, personally viewed and interpreted this ECG.  Date: 04/19/2020 EKG Time: 23: 54 Rate: 69 Rhythm: sinus rhythm QRS Axis:  normal Intervals: Bifascicular block ST/T Wave abnormalities: Non-specific ST segment / T-wave changes, but no clear evidence of acute ischemia. Narrative Interpretation: no definitive evidence of acute ischemia; does not meet STEMI criteria.  I compared this to a prior EKG from June 2021 and the morphology is very similar with no substantial changes.   ____________________________________________  RADIOLOGY Marylou Mccoy, personally viewed and evaluated these images (plain radiographs) as part of my medical decision making, as well as reviewing the written report by the radiologist.  I also discussed the results by phone with the radiologist (Dr. Ramiro Harvest).  ED MD interpretation: Acute right-sided epidural hemorrhage.  Chronic changes to the cervical spine, no acute changes.  Official radiology report(s): CT HEAD WO CONTRAST  Result Date: 04/20/2020 CLINICAL DATA:  Fall, scalp laceration, head injury, dual anti-platelet therapy EXAM: CT HEAD WITHOUT CONTRAST CT CERVICAL SPINE WITHOUT CONTRAST TECHNIQUE: Multidetector CT imaging of the head and cervical spine was performed following the standard protocol without intravenous contrast. Multiplanar CT image reconstructions of the cervical spine were also generated. COMPARISON:  08/02/2014 FINDINGS: CT HEAD FINDINGS Brain: There is an acute epidural hematoma in the region of the right pterion along the frontal convexity abutting the right frontal and temporal lobes adjacent to the sylvian this measures 12 mm x 33 mm at axial image # 13/3. There is associated small subdural hemorrhage anteriorly just superior to the right anterior cranial fossa layering along the frontal bone and falx. Additional tiny subdural hemorrhage seen within the right middle cranial fossa. No significant associated mass effect. Moderate parenchymal volume loss is commensurate with the patient's age. Mild periventricular white matter changes are present likely reflecting the  sequela of small vessel ischemia. Remote right thalamic and pontine lacunar infarcts noted. No acute infarct. Ventricular size is normal. No intraventricular or subarachnoid hemorrhage is clearly identified. Cerebellum is unremarkable. Vascular: No asymmetric hyperdense vasculature at the skull base. Skull: Intact Sinuses/Orbits: The orbits are unremarkable. There is mucosal thickening and opacification of the  ethmoid air cells bilaterally in keeping with mild paranasal sinus disease. Other: The mastoid air cells and middle ear cavities are clear. There is subcutaneous hemorrhage involving the left parietal scalp at the vertex. CT CERVICAL SPINE FINDINGS Alignment: Normal.  No listhesis. Skull base and vertebrae: The craniocervical junction is unremarkable. Atlantodental interval is normal. No acute fracture of the cervical spine. No lytic or blastic bone lesion. Soft tissues and spinal canal: Prominent calcified central posterior disc herniation noted at C5-6 with effacement of the anterior canal space and abutment of the thecal sac. No canal hematoma identified. No prevertebral soft tissue swelling. No paraspinal fluid collection identified. Disc levels: Review of the sagittal reformats demonstrates straightening of the cervical spine which may be positional in nature. Vertebral body height has been preserved. There is advanced intervertebral disc space narrowing and endplate remodeling at C6-7 in keeping with changes of advanced degenerative disc disease at this level. Milder degenerative changes are noted at C4-5 and C5-6. The spinal canal is widely patent save for minimal narrowing at C5-6, as noted above. The prevertebral soft tissues are not thickened. Review of the axial images demonstrates mild multilevel uncovertebral and facet arthrosis resulting in mild to moderate right neural foraminal narrowing at C5-6 and bilaterally at C6-7 Upper chest: Unremarkable Other: Extensive atherosclerotic calcification  noted within the left carotid bifurcation. IMPRESSION: Right epidural hematoma superficial to the sylvian fissure with minimal abutment of the right frontal and temporal lobes. Small right frontal subdural hematoma.  No associated mass effect. No acute fracture of the cervical spine. These results were called by telephone at the time of interpretation on 04/20/2020 at 12:57 am to provider [facility] , who verbally acknowledged these results. Electronically Signed   By: Helyn Numbers MD   On: 04/20/2020 00:59   CT CERVICAL SPINE WO CONTRAST  Result Date: 04/20/2020 CLINICAL DATA:  Fall, scalp laceration, head injury, dual anti-platelet therapy EXAM: CT HEAD WITHOUT CONTRAST CT CERVICAL SPINE WITHOUT CONTRAST TECHNIQUE: Multidetector CT imaging of the head and cervical spine was performed following the standard protocol without intravenous contrast. Multiplanar CT image reconstructions of the cervical spine were also generated. COMPARISON:  08/02/2014 FINDINGS: CT HEAD FINDINGS Brain: There is an acute epidural hematoma in the region of the right pterion along the frontal convexity abutting the right frontal and temporal lobes adjacent to the sylvian this measures 12 mm x 33 mm at axial image # 13/3. There is associated small subdural hemorrhage anteriorly just superior to the right anterior cranial fossa layering along the frontal bone and falx. Additional tiny subdural hemorrhage seen within the right middle cranial fossa. No significant associated mass effect. Moderate parenchymal volume loss is commensurate with the patient's age. Mild periventricular white matter changes are present likely reflecting the sequela of small vessel ischemia. Remote right thalamic and pontine lacunar infarcts noted. No acute infarct. Ventricular size is normal. No intraventricular or subarachnoid hemorrhage is clearly identified. Cerebellum is unremarkable. Vascular: No asymmetric hyperdense vasculature at the skull base.  Skull: Intact Sinuses/Orbits: The orbits are unremarkable. There is mucosal thickening and opacification of the ethmoid air cells bilaterally in keeping with mild paranasal sinus disease. Other: The mastoid air cells and middle ear cavities are clear. There is subcutaneous hemorrhage involving the left parietal scalp at the vertex. CT CERVICAL SPINE FINDINGS Alignment: Normal.  No listhesis. Skull base and vertebrae: The craniocervical junction is unremarkable. Atlantodental interval is normal. No acute fracture of the cervical spine. No lytic or blastic bone lesion.  Soft tissues and spinal canal: Prominent calcified central posterior disc herniation noted at C5-6 with effacement of the anterior canal space and abutment of the thecal sac. No canal hematoma identified. No prevertebral soft tissue swelling. No paraspinal fluid collection identified. Disc levels: Review of the sagittal reformats demonstrates straightening of the cervical spine which may be positional in nature. Vertebral body height has been preserved. There is advanced intervertebral disc space narrowing and endplate remodeling at C6-7 in keeping with changes of advanced degenerative disc disease at this level. Milder degenerative changes are noted at C4-5 and C5-6. The spinal canal is widely patent save for minimal narrowing at C5-6, as noted above. The prevertebral soft tissues are not thickened. Review of the axial images demonstrates mild multilevel uncovertebral and facet arthrosis resulting in mild to moderate right neural foraminal narrowing at C5-6 and bilaterally at C6-7 Upper chest: Unremarkable Other: Extensive atherosclerotic calcification noted within the left carotid bifurcation. IMPRESSION: Right epidural hematoma superficial to the sylvian fissure with minimal abutment of the right frontal and temporal lobes. Small right frontal subdural hematoma.  No associated mass effect. No acute fracture of the cervical spine. These results were  called by telephone at the time of interpretation on 04/20/2020 at 12:57 am to provider [facility] , who verbally acknowledged these results. Electronically Signed   By: Helyn Numbers MD   On: 04/20/2020 00:59    ____________________________________________   PROCEDURES   Procedure(s) performed (including Critical Care):  .Critical Care Performed by: Loleta Rose, MD Authorized by: Loleta Rose, MD   Critical care provider statement:    Critical care time (minutes):  45   Critical care time was exclusive of:  Separately billable procedures and treating other patients   Critical care was necessary to treat or prevent imminent or life-threatening deterioration of the following conditions:  Trauma   Critical care was time spent personally by me on the following activities:  Development of treatment plan with patient or surrogate, discussions with consultants, evaluation of patient's response to treatment, examination of patient, obtaining history from patient or surrogate, ordering and performing treatments and interventions, ordering and review of laboratory studies, ordering and review of radiographic studies, pulse oximetry, re-evaluation of patient's condition and review of old charts  .Marland KitchenLaceration Repair  Date/Time: 04/20/2020 2:50 AM Performed by: Loleta Rose, MD Authorized by: Loleta Rose, MD   Consent:    Consent obtained:  Verbal   Consent given by:  Patient and spouse   Risks discussed:  Infection, pain, retained foreign body, poor cosmetic result, poor wound healing and need for additional repair Anesthesia (see MAR for exact dosages):    Anesthesia method:  Local infiltration   Local anesthetic:  Lidocaine 2% WITH epi Laceration details:    Location:  Scalp   Scalp location:  Crown   Length (cm):  11 Repair type:    Repair type:  Intermediate Exploration:    Hemostasis achieved with:  Direct pressure   Wound exploration: entire depth of wound probed and  visualized     Contaminated: no   Treatment:    Area cleansed with:  Saline   Amount of cleaning:  Extensive   Irrigation solution:  Sterile saline   Visualized foreign bodies/material removed: no   Skin repair:    Repair method:  Sutures and staples   Suture size:  2-0   Suture material:  Nylon   Suture technique:  Horizontal mattress and simple interrupted (1 horizontal mattress, 1 simple interrupted)   Number  of sutures:  2   Number of staples:  8 Approximation:    Approximation:  Close Post-procedure details:    Dressing:  Sterile dressing   Patient tolerance of procedure:  Tolerated well, no immediate complications .1-3 Lead EKG Interpretation Performed by: Loleta Rose, MD Authorized by: Loleta Rose, MD     Interpretation: normal     ECG rate:  70   ECG rate assessment: normal     Rhythm: sinus rhythm     Ectopy: none     Conduction: normal       ____________________________________________   INITIAL IMPRESSION / MDM / ASSESSMENT AND PLAN / ED COURSE  As part of my medical decision making, I reviewed the following data within the electronic MEDICAL RECORD NUMBER History obtained from family, Nursing notes reviewed and incorporated, Labs reviewed , EKG interpreted , Old chart reviewed, Discussed with local neurosurgeon (Dr. Adriana Simas), [facility] with [facility] Discussed with radiologist (Dr. Ramiro Harvest) and reviewed Notes from prior ED visits   Differential diagnosis includes, but is not limited to, subdural hemorrhage, epidural hemorrhage, scalp laceration, skull fracture, cervical spine injury, less likely electrolyte or metabolic abnormality.  It sounds as if the patient had a mechanical fall but has a substantial laceration to the scalp but the bleeding is currently controlled with a pressure dressing.  I reviewed the images of his head CT as soon as they were available and to my viewing it appears that he has a substantial right-sided parietal subdural  hemorrhage.  I ordered a CT of the cervical spine as well.  I evaluated him as soon as he came back to the room.  His mental status seems to be at baseline and he is in no distress.  No obvious focal neurological deficits.  Basic metabolic panel is back in normal.  Coags and CBC are pending.  Will discuss the case with the radiologist as well as with neurosurgery.  I updated his wife and will address the scalp laceration once we have more of a definitive plan.  The patient is on the cardiac monitor to evaluate for evidence of arrhythmia and/or significant heart rate changes.     Clinical Course as of Apr 20 253  Wed Apr 20, 2020  1610 Discussed case by phone with the radiologist, Dr. .  Reported epidural hematoma on the right as well as some subdural blood.    [CF]  [phone] Spoke by phone with Dr. Adriana Simas with neurosurgery.  He is going to review the images and call me back.   [CF]  5409 Spoke by phone with Dr. Adriana Simas.  He agrees that this is an epidural hemorrhage and recommends transfer to a [facility] facility if the family is interested in considering surgery if it becomes necessary.  I will speak with the patient and his wife.   [CF]  0111 Discussed with patient and wife.  She confirmed he is full code and that they would pursue surgery if it is recommended.  Initiated transfer process to [facility].   [CF]  0120 Discussed by phone with [facility]  She will present the case to the appropriate provider(s) and call me back. They are at capacity but will see if they can make room for him.   [CF]  0152 SARS Coronavirus 2 by RT PCR: NEGATIVE [CF]  0203 Patient accepted to [facility] by Dr. Vivi Martens, Neuro ICU.  Updated patient and wife.   [CF]  0248 Finished laceration repair.  Patient tolerated  it well, has no focal neuro deficitis, no change in mental status.  Stable for transport,.   [CF]  0253 CBC reassuring including hemoglobin of 12.6.  Urinalysis unremarkable.  Covid test  negative.  Coags are within normal limits.   [CF]    Clinical Course User Index [CF] Loleta Rose, MD     ____________________________________________  FINAL CLINICAL IMPRESSION(S) / ED DIAGNOSES  Final diagnoses:  Epidural hemorrhage without loss of consciousness, initial encounter [facility])  Fall, initial encounter  Laceration of scalp, initial encounter  Dementia without behavioral disturbance, unspecified dementia type (HCC)  Antiplatelet or antithrombotic long-term use     MEDICATIONS GIVEN DURING THIS VISIT:  Medications  lidocaine-EPINEPHrine (XYLOCAINE W/EPI) 2 %-1:100000 (with pres) injection 20 mL (has no administration in time range)     ED Discharge Orders    None      *Please note:  Alex [name] was evaluated in Emergency Department on 04/20/2020 for the symptoms described in the history of present illness. He was evaluated in the context of the global COVID-19 pandemic, which necessitated consideration that the patient might be at risk for infection with the SARS-CoV-2 virus that causes COVID-19. Institutional protocols and algorithms that pertain to the evaluation of patients at risk for COVID-19 are in a state of rapid change based on information released by regulatory bodies including the CDC and federal and state organizations. These policies and algorithms were followed during the patient's care in the ED.  Some ED evaluations and interventions may be delayed as a result of limited staffing during and after the pandemic.*  Note:  This document was prepared using Dragon voice recognition software and may include unintentional dictation errors.   Loleta Rose, MD 04/20/20 7341    Loleta Rose, MD 04/20/20 [phone]

## 2020-05-16 ENCOUNTER — Other Ambulatory Visit: Payer: Self-pay

## 2020-05-16 ENCOUNTER — Emergency Department
Admission: EM | Admit: 2020-05-16 | Discharge: 2020-05-16 | Disposition: A | Discharge status: Home / Self Care | Payer: Medicare Other | Attending: Emergency Medicine | Admitting: Emergency Medicine

## 2020-05-16 ENCOUNTER — Encounter: Payer: Self-pay | Admitting: Emergency Medicine

## 2020-05-16 ENCOUNTER — Emergency Department: Payer: Medicare Other

## 2020-05-16 DIAGNOSIS — Z96651 Presence of right artificial knee joint: Secondary | ICD-10-CM | POA: Insufficient documentation

## 2020-05-16 DIAGNOSIS — Z7901 Long term (current) use of anticoagulants: Secondary | ICD-10-CM | POA: Insufficient documentation

## 2020-05-16 DIAGNOSIS — X58XXXA Exposure to other specified factors, initial encounter: Secondary | ICD-10-CM | POA: Diagnosis not present

## 2020-05-16 DIAGNOSIS — S065XAA Traumatic subdural hemorrhage with loss of consciousness status unknown, initial encounter: Secondary | ICD-10-CM

## 2020-05-16 DIAGNOSIS — S065X9A Traumatic subdural hemorrhage with loss of consciousness of unspecified duration, initial encounter: Secondary | ICD-10-CM | POA: Insufficient documentation

## 2020-05-16 DIAGNOSIS — Z7902 Long term (current) use of antithrombotics/antiplatelets: Secondary | ICD-10-CM | POA: Insufficient documentation

## 2020-05-16 DIAGNOSIS — Z79899 Other long term (current) drug therapy: Secondary | ICD-10-CM | POA: Insufficient documentation

## 2020-05-16 DIAGNOSIS — I2510 Atherosclerotic heart disease of native coronary artery without angina pectoris: Secondary | ICD-10-CM | POA: Insufficient documentation

## 2020-05-16 DIAGNOSIS — R4182 Altered mental status, unspecified: Secondary | ICD-10-CM | POA: Insufficient documentation

## 2020-05-16 DIAGNOSIS — Z7982 Long term (current) use of aspirin: Secondary | ICD-10-CM | POA: Diagnosis not present

## 2020-05-16 DIAGNOSIS — R531 Weakness: Secondary | ICD-10-CM

## 2020-05-16 DIAGNOSIS — F0390 Unspecified dementia without behavioral disturbance: Secondary | ICD-10-CM | POA: Insufficient documentation

## 2020-05-16 DIAGNOSIS — Z20822 Contact with and (suspected) exposure to covid-19: Secondary | ICD-10-CM | POA: Diagnosis not present

## 2020-05-16 DIAGNOSIS — Z951 Presence of aortocoronary bypass graft: Secondary | ICD-10-CM | POA: Diagnosis not present

## 2020-05-16 DIAGNOSIS — I10 Essential (primary) hypertension: Secondary | ICD-10-CM | POA: Diagnosis not present

## 2020-05-16 DIAGNOSIS — S0990XA Unspecified injury of head, initial encounter: Secondary | ICD-10-CM | POA: Diagnosis present

## 2020-05-16 LAB — COMPREHENSIVE METABOLIC PANEL
ALT: 14 U/L (ref 0–44)
AST: 18 U/L (ref 15–41)
Albumin: 4.4 g/dL (ref 3.5–5.0)
Alkaline Phosphatase: 92 U/L (ref 38–126)
Anion gap: 8 (ref 5–15)
BUN: 17 mg/dL (ref 8–23)
CO2: 28 mmol/L (ref 22–32)
Calcium: 9.3 mg/dL (ref 8.9–10.3)
Chloride: 101 mmol/L (ref 98–111)
Creatinine, Ser: 1.22 mg/dL (ref 0.61–1.24)
GFR, Estimated: 58 mL/min — ABNORMAL LOW (ref >60)
Glucose, Bld: 110 mg/dL — ABNORMAL HIGH (ref 70–99)
Potassium: 4.4 mmol/L (ref 3.5–5.1)
Sodium: 137 mmol/L (ref 135–145)
Total Bilirubin: 1.2 mg/dL (ref 0.3–1.2)
Total Protein: 7.1 g/dL (ref 6.5–8.1)

## 2020-05-16 LAB — DIFFERENTIAL
Abs Immature Granulocytes: 0.02 10*3/uL (ref 0.00–0.07)
Basophils Absolute: 0 10*3/uL (ref 0.0–0.1)
Basophils Relative: 0 %
Eosinophils Absolute: 0.3 10*3/uL (ref 0.0–0.5)
Eosinophils Relative: 5 %
Immature Granulocytes: 0 %
Lymphocytes Relative: 16 %
Lymphs Abs: 0.8 10*3/uL (ref 0.7–4.0)
Monocytes Absolute: 0.4 10*3/uL (ref 0.1–1.0)
Monocytes Relative: 7 %
Neutro Abs: 3.9 10*3/uL (ref 1.7–7.7)
Neutrophils Relative %: 72 %

## 2020-05-16 LAB — CBC
HCT: 38 % — ABNORMAL LOW (ref 39.0–52.0)
Hemoglobin: 12.7 g/dL — ABNORMAL LOW (ref 13.0–17.0)
MCH: 30.5 pg (ref 26.0–34.0)
MCHC: 33.4 g/dL (ref 30.0–36.0)
MCV: 91.3 fL (ref 80.0–100.0)
Platelets: 130 10*3/uL — ABNORMAL LOW (ref 150–400)
RBC: 4.16 MIL/uL — ABNORMAL LOW (ref 4.22–5.81)
RDW: 14.4 % (ref 11.5–15.5)
WBC: 5.4 10*3/uL (ref 4.0–10.5)
nRBC: 0 % (ref 0.0–0.2)

## 2020-05-16 LAB — PROTIME-INR
INR: 1.2 (ref 0.8–1.2)
Prothrombin Time: 14.3 seconds (ref 11.4–15.2)

## 2020-05-16 LAB — URINALYSIS, COMPLETE (UACMP) WITH MICROSCOPIC
Bacteria, UA: NONE SEEN
Bilirubin Urine: NEGATIVE
Glucose, UA: NEGATIVE mg/dL
Hgb urine dipstick: NEGATIVE
Ketones, ur: NEGATIVE mg/dL
Nitrite: NEGATIVE
Protein, ur: NEGATIVE mg/dL
Specific Gravity, Urine: 1.015 (ref 1.005–1.030)
pH: 6 (ref 5.0–8.0)

## 2020-05-16 LAB — RESP PANEL BY RT-PCR (FLU A&B, COVID) ARPGX2
Influenza A by PCR: NEGATIVE
Influenza B by PCR: NEGATIVE
SARS Coronavirus 2 by RT PCR: NEGATIVE

## 2020-05-16 LAB — APTT: aPTT: 35 seconds (ref 24–36)

## 2020-05-16 LAB — CBG MONITORING, ED: Glucose-Capillary: 99 mg/dL (ref 70–99)

## 2020-05-16 MED ORDER — SODIUM CHLORIDE 0.9% IV SOLUTION
Freq: Once | INTRAVENOUS | Status: AC
  Filled 2020-05-16: qty 250

## 2020-05-16 MED ORDER — SODIUM CHLORIDE 0.9 % IV SOLN
20.0000 ug | Freq: Once | INTRAVENOUS | Status: AC
  Administered 2020-05-16: 20 ug via INTRAVENOUS
  Filled 2020-05-16: qty 5

## 2020-05-16 NOTE — ED Triage Notes (Addendum)
Patient to ER for c/o increased confusion (has h/o dementia), stating he has been urinating a lot. Wife also states patient has been dragging left leg behind him (more pronounced this past weekend, but started after Thursday). Patient oriented to self, disoriented to time or place.

## 2020-05-16 NOTE — ED Notes (Signed)
EMTALA reviewed by charge RN 

## 2020-05-16 NOTE — ED Provider Notes (Addendum)
[facility] Emergency Department Provider Note ____________________________________________   First MD Initiated Contact with Patient 05/16/20 [phone]     (approximate)  I have reviewed the triage vital signs and the nursing notes.  HISTORY  Chief Complaint Altered Mental Status   HPI Alex [name] is a 86 y.o. malewho presents to the ED for evaluation of altered mentation and gait change.  Chart review indicates history of CAD on DAPT with aspirin and Plavix. 11/10-11/11 [facility] with an epidural hematoma. History of dementia.  Patient went home with wife after this recent admission.  Patient presents to the ED with his wife due to worsening confusion and gait changes of the past few days.  Patient is unable to provide any relevant history due to his baseline dementia.  Indicates that he feels well and has no complaints, denies pain.  Wife arrives later and indicates that patient has been doing well until the past 3 days when she has noticed him being more confused and not walking as well.  He has been ambulate independently for the past few weeks, but is relied upon a walker for the past 3 days due to weakness and dragging his left leg.  Wife adamantly denies any falls and denies providing the patient any aspirin or Plavix.  He has been off of these medications since that admission.  She denies any known fevers, cough, shortness of breath or other illnesses.  Past Medical History:  Diagnosis Date  . Arthritis   . CAD (coronary artery disease)    post CABG; myoview 2009 neg  . Carotid artery disease (HCC)    Mild  . Cirrhosis of liver (HCC)   . Dementia (HCC)   . GERD (gastroesophageal reflux disease)   . Hepatitis C    Inactive  . Hyperlipidemia   . Hypertension   . Idiopathic thrombocytopenia (HCC)   . Memory loss   . Myocardial infarction [facility](HCC) 1996   [place]  . RBBB (right bundle branch block)   . Right bundle branch  block (RBBB)   . Right tibial fracture 2014  . Stroke [facility](HCC) February 2016   [facility]  . Thrombocytopenia (HCC)   . TIA (transient ischemic attack)   . TIA (transient ischemic attack)     Patient Active Problem List   Diagnosis Date Noted  . Angina pectoris (HCC)   . Pain in the chest   . History of stroke   . Essential hypertension   . Status post right partial knee replacement 06/14/2015  . CVA (cerebral vascular accident) (HCC) 11/05/2014  . History of recent fall 05/10/2014  . Broken nose 05/10/2014  . Malaise 01/07/2012  . Dizziness 12/15/2010  . Carotid stenosis 07/04/2009  . PALPITATIONS 03/23/2009  . Hyperlipidemia 12/26/2008  . HYPERTENSION, BENIGN 12/26/2008  . CAD, ARTERY BYPASS GRAFT 12/26/2008    Past Surgical History:  Procedure Laterality Date  . CORONARY ARTERY BYPASS GRAFT    . EYE SURGERY Bilateral    Cataract Extraction  . FRACTURE SURGERY Right 1981   Rod in right femur  . JOINT REPLACEMENT    . LEG SURGERY  2014   right leg  . PARTIAL KNEE ARTHROPLASTY Right 06/14/2015   Procedure: UNICOMPARTMENTAL KNEE;  Surgeon: Christena FlakeJohn J Poggi, MD;  Location: [facility] ORS;  Service: Orthopedics;  Laterality: Right;  . TIBIA FRACTURE SURGERY Right 2014   Rod in tibia  . UPPER GASTROINTESTINAL ENDOSCOPY      Prior to Admission medications  Medication Sig Start Date End Date Taking? Authorizing Provider  aspirin 81 MG tablet Take 81 mg by mouth daily.    [provider]  clopidogrel (PLAVIX) 75 MG tablet Take 75 mg by mouth daily.    [provider]  Cyanocobalamin (B-12 PO) Take 1,000 mg by mouth daily.     [provider]  ezetimibe (ZETIA) 10 MG tablet Take 1 tablet (10 mg total) by mouth daily. 02/03/20 05/03/20  Antonieta Iba, MD  galantamine (RAZADYNE) 8 MG tablet Take 12 mg by mouth 2 (two) times daily.  02/26/13   [provider]  irbesartan (AVAPRO) 300 MG tablet Take 0.5 tablets (150 mg total) by mouth daily. 03/23/19    Antonieta Iba, MD  levothyroxine (SYNTHROID, LEVOTHROID) 75 MCG tablet Take 75 mcg by mouth daily. 12/21/16   [provider]  Memantine HCl ER (NAMENDA XR) 21 MG CP24 Take 21 mg by mouth daily.  04/13/15   [provider]  Multiple Vitamins-Minerals (CENTRUM SILVER) tablet Take 1 tablet by mouth daily.      [provider]  nitroGLYCERIN (NITROSTAT) 0.4 MG SL tablet Place 1 tablet (0.4 mg total) under the tongue as directed. 10/23/16   Antonieta Iba, MD  omeprazole (PRILOSEC) 40 MG capsule Take 40 mg by mouth daily. 10/08/17   [provider]  sertraline (ZOLOFT) 50 MG tablet Take 50 mg by mouth daily.  03/11/15   [provider]  simvastatin (ZOCOR) 40 MG tablet TAKE ONE TABLET BY MOUTH AT BEDTIME 09/09/19   Antonieta Iba, MD    Allergies Aspirin, Aspirin-dipyridamole er, Aspirin-dipyridamole er, Ciprofloxacin, Contrast media [iodinated diagnostic agents], Morphine, Other, and Venlafaxine  Family History  Problem Relation Age of Onset  . Diabetes Neg Hx   . Coronary artery disease Neg Hx     Social History Social History   Tobacco Use  . Smoking status: Never Smoker  . Smokeless tobacco: Never Used  Substance Use Topics  . Alcohol use: No  . Drug use: No    Review of Systems  Unable to be accurately assessed due to patient's baseline dementia  ____________________________________________   PHYSICAL EXAM:  VITAL SIGNS: Vitals:   05/16/20 1023 05/16/20 1040  BP: 120/84 136/66  Pulse: 66 76  Resp: 16 19  Temp: 98.3 F (36.8 C) 97.8 F (36.6 C)  SpO2:  98%     Constitutional: Alert and pleasantly disoriented. Well appearing and in no acute distress. Eyes: Conjunctivae are normal. PERRL. EOMI. Head: Healing scabbing wound to his left parietal scalp from his admission 1 month ago.  No evidence of acute trauma. Nose: No congestion/rhinnorhea. Mouth/Throat: Mucous membranes are moist.  Oropharynx  non-erythematous. Neck: No stridor. No cervical spine tenderness to palpation. Cardiovascular: Normal rate, regular rhythm. Grossly normal heart sounds.  Good peripheral circulation. Respiratory: Normal respiratory effort.  No retractions. Lungs CTAB. Gastrointestinal: Soft , nondistended, nontender to palpation. No CVA tenderness. Musculoskeletal: No lower extremity tenderness nor edema.  No joint effusions. No signs of acute trauma. Neurologic:  Normal speech and language. Cranial nerves intact Left arm and left leg weakness noted Sensation intact throughout Skin:  Skin is warm, dry and intact. No rash noted. Psychiatric: Mood and affect are normal. Speech and behavior are normal.  ____________________________________________   LABS (all labs ordered are listed, but only abnormal results are displayed)  Labs Reviewed  CBC - Abnormal; Notable for the following components:      Result Value   RBC  4.16 (*)    Hemoglobin 12.7 (*)    HCT 38.0 (*)    Platelets 130 (*)    All other components within normal limits  COMPREHENSIVE METABOLIC PANEL - Abnormal; Notable for the following components:   Glucose, Bld 110 (*)    GFR, Estimated 58 (*)    All other components within normal limits  RESP PANEL BY RT-PCR (FLU A&B, COVID) ARPGX2  PROTIME-INR  APTT  DIFFERENTIAL  URINALYSIS, COMPLETE (UACMP) WITH MICROSCOPIC  CBG MONITORING, ED  PREPARE PLATELET PHERESIS   ____________________________________________  12 Lead EKG  Sinus rhythm, rate of 71 bpm.  Normal axis.  Bifascicular block.  T wave inversions to anterior and inferior leads without evidence of acute ischemia. ____________________________________________  RADIOLOGY  ED MD interpretation: CT head reviewed by me with evidence of acute on chronic right-sided subdural hematoma with greater than 1 cm of midline shift.  Official radiology report(s): CT HEAD WO CONTRAST  Result Date: 05/16/2020 CLINICAL DATA:  Mental status  change. Increased confusion. Dragging the left leg. History of dementia. EXAM: CT HEAD WITHOUT CONTRAST TECHNIQUE: Contiguous axial images were obtained from the base of the skull through the vertex without intravenous contrast. COMPARISON:  04/20/2020 FINDINGS: Brain: There is a large right-sided subdural hematoma which has greatly increased in size from the prior study. The hematoma is primarily hypoattenuating, however there are scattered hyperattenuating blood products as well. The hematoma measures up to 2.1 cm in thickness, and there is increased mass effect on the right cerebral hemisphere with 1.1 cm of leftward midline shift. There is partial effacement of the lateral and third ventricles. No acute infarct is identified. Hypodensities in the cerebral white matter bilaterally are unchanged and nonspecific but compatible with mild chronic small vessel ischemic disease. A chronic lacunar infarct is again noted in the right thalamus. Vascular: Calcified atherosclerosis at the skull base. No hyperdense vessel. Skull: No fracture or suspicious osseous lesion. Sinuses/Orbits: Moderate right and mild left ethmoid sinus mucosal thickening. Clear mastoid air cells. Bilateral cataract extraction. Other: Near complete resolution of left-sided scalp hematoma. IMPRESSION: Greatly increased size of a now large right-sided subdural hematoma with increased mass effect and 1.1 cm of leftward midline shift. Critical Value/emergent results were called by telephone at the time of interpretation on 05/16/2020 at 9:57 am to Dr. Katrinka Blazing, who verbally acknowledged these results. Electronically Signed   By: Sebastian Ache M.D.   On: 05/16/2020 10:00    ____________________________________________   PROCEDURES and INTERVENTIONS  Procedure(s) performed (including Critical Care):  .1-3 Lead EKG Interpretation Performed by: Delton Prairie, MD Authorized by: Delton Prairie, MD     Interpretation: normal     ECG rate:  80   ECG  rate assessment: normal     Rhythm: sinus rhythm     Ectopy: none     Conduction: normal   .Critical Care Performed by: Delton Prairie, MD Authorized by: Delton Prairie, MD   Critical care provider statement:    Critical care time (minutes):  45   Critical care was necessary to treat or prevent imminent or life-threatening deterioration of the following conditions:  CNS failure or compromise   Critical care was time spent personally by me on the following activities:  Discussions with consultants, evaluation of patient's response to treatment, examination of patient, ordering and performing treatments and interventions, ordering and review of laboratory studies, ordering and review of radiographic studies, pulse oximetry, re-evaluation of patient's condition, obtaining history from patient or surrogate and review of old  charts    Medications  desmopressin (DDAVP) 20 mcg in sodium chloride 0.9 % 50 mL IVPB (has no administration in time range)  0.9 %  sodium chloride infusion (Manually program via Guardrails IV Fluids) ( Intravenous Stopped 05/16/20 1046)    ____________________________________________   MDM / ED COURSE   86 year old male admitted 1 month ago for an epidural hematoma managed nonoperatively, presents to the ED with a few days of gait changes and confusion, found to have a large right-sided acute on chronic subdural hematoma with the night shift requiring transfer to neurosurgical capable institution.  Normal vitals on room air.  Exam shows surprisingly well-appearing patient who has no complaints.  He is pleasantly demented at his baseline per the wife at the bedside.  His has weakness to his left arm and leg.  CT head with enlarging SDH with midline shift.  Prior to his wife arriving in providing history, I order DDAVP and platelets transfusion to reverse possible DAPT.  Patient remains hemodynamically stable on the ED and has no acute events by the time of this writing.  Speak  with neurosurgery our institution, and neuro ICU at [facility].  We will transfer to [facility] for ICU admission and further neurosurgical work-up and management.   Clinical Course as of May 17 716  Mon May 16, 2020  4010 Nurse informs me of radiology calling another physician about this patient's CT head with subdural hematoma with shift.  I reviewed the images in his chart briefly before going to see him.   [DS]  2725 Dr. Mosetta Putt, rads,  He also cannot see the images from [facility], but the reports are saying about 3 mm of midline shift where he is now at 11 mm of midline shift.  Awaiting callback from neurosurgery who I have already paged.   [DS]  3664 I speak with Dr. Adriana Simas, neurosurgery on-call, who is currently in the OR, and we discussed patient's presentation.  He recommends administering platelets on top of the DDAVP and transferring to [facility].   [DS]  1008 I speak with [facility] transfer center to initiate transfer for ICU admission   [DS]  1022 I speak with patient's wife at the bedside and obtained history, as above in HPI.   [DS]  1052 Dr. Sherryll Burger, neurointensivist , at [facility].  Unsure if any available  [facility]   [DS]  1158 Dr. Adriana Simas has seen the patient and we're waiting on transfer   [DS]    Clinical Course User Index [DS] Delton Prairie, MD    ____________________________________________   FINAL CLINICAL IMPRESSION(S) / ED DIAGNOSES  Final diagnoses:  Altered mental status, unspecified altered mental status type  Subdural hematoma (HCC)  Left-sided weakness     ED Discharge Orders    None       Jestin Burbach   Note:  This document was prepared using [employer] software and may include unintentional dictation errors.   Delton Prairie, MD 05/16/20 1107    Delton Prairie, MD 05/17/20 [phone]

## 2020-05-17 LAB — PREPARE PLATELET PHERESIS: Unit division: 0

## 2020-05-17 LAB — BPAM PLATELET PHERESIS
Blood Product Expiration Date: 202112092359
ISSUE DATE / TIME: 202112061013
Unit Type and Rh: 5100

## 2020-06-03 ENCOUNTER — Inpatient Hospital Stay
Admission: EM | Admit: 2020-06-03 | Discharge: 2020-06-05 | DRG: 315 | Disposition: A | Discharge status: Skilled Nursing Facility | Payer: Medicare Other | Source: Skilled Nursing Facility | Attending: Internal Medicine | Admitting: Internal Medicine

## 2020-06-03 ENCOUNTER — Other Ambulatory Visit: Payer: Self-pay

## 2020-06-03 ENCOUNTER — Emergency Department: Payer: Medicare Other

## 2020-06-03 DIAGNOSIS — Z96651 Presence of right artificial knee joint: Secondary | ICD-10-CM | POA: Diagnosis present

## 2020-06-03 DIAGNOSIS — E039 Hypothyroidism, unspecified: Secondary | ICD-10-CM | POA: Diagnosis present

## 2020-06-03 DIAGNOSIS — Z20822 Contact with and (suspected) exposure to covid-19: Secondary | ICD-10-CM | POA: Diagnosis present

## 2020-06-03 DIAGNOSIS — I2510 Atherosclerotic heart disease of native coronary artery without angina pectoris: Secondary | ICD-10-CM

## 2020-06-03 DIAGNOSIS — Z8673 Personal history of transient ischemic attack (TIA), and cerebral infarction without residual deficits: Secondary | ICD-10-CM

## 2020-06-03 DIAGNOSIS — S065X0D Traumatic subdural hemorrhage without loss of consciousness, subsequent encounter: Secondary | ICD-10-CM

## 2020-06-03 DIAGNOSIS — R7989 Other specified abnormal findings of blood chemistry: Secondary | ICD-10-CM

## 2020-06-03 DIAGNOSIS — K219 Gastro-esophageal reflux disease without esophagitis: Secondary | ICD-10-CM | POA: Diagnosis present

## 2020-06-03 DIAGNOSIS — R410 Disorientation, unspecified: Secondary | ICD-10-CM | POA: Diagnosis present

## 2020-06-03 DIAGNOSIS — Z79899 Other long term (current) drug therapy: Secondary | ICD-10-CM

## 2020-06-03 DIAGNOSIS — I10 Essential (primary) hypertension: Secondary | ICD-10-CM | POA: Diagnosis present

## 2020-06-03 DIAGNOSIS — Z8679 Personal history of other diseases of the circulatory system: Secondary | ICD-10-CM

## 2020-06-03 DIAGNOSIS — I9589 Other hypotension: Secondary | ICD-10-CM | POA: Diagnosis not present

## 2020-06-03 DIAGNOSIS — N179 Acute kidney failure, unspecified: Secondary | ICD-10-CM

## 2020-06-03 DIAGNOSIS — B182 Chronic viral hepatitis C: Secondary | ICD-10-CM | POA: Diagnosis present

## 2020-06-03 DIAGNOSIS — Z951 Presence of aortocoronary bypass graft: Secondary | ICD-10-CM

## 2020-06-03 DIAGNOSIS — Z79890 Hormone replacement therapy (postmenopausal): Secondary | ICD-10-CM

## 2020-06-03 DIAGNOSIS — F05 Delirium due to known physiological condition: Secondary | ICD-10-CM | POA: Diagnosis present

## 2020-06-03 DIAGNOSIS — E785 Hyperlipidemia, unspecified: Secondary | ICD-10-CM | POA: Diagnosis present

## 2020-06-03 DIAGNOSIS — S064X9D Epidural hemorrhage with loss of consciousness of unspecified duration, subsequent encounter: Secondary | ICD-10-CM

## 2020-06-03 DIAGNOSIS — F0391 Unspecified dementia with behavioral disturbance: Secondary | ICD-10-CM | POA: Diagnosis present

## 2020-06-03 DIAGNOSIS — I959 Hypotension, unspecified: Secondary | ICD-10-CM

## 2020-06-03 DIAGNOSIS — K7460 Unspecified cirrhosis of liver: Secondary | ICD-10-CM | POA: Diagnosis present

## 2020-06-03 DIAGNOSIS — R29810 Facial weakness: Secondary | ICD-10-CM | POA: Diagnosis present

## 2020-06-03 DIAGNOSIS — F0390 Unspecified dementia without behavioral disturbance: Secondary | ICD-10-CM | POA: Diagnosis present

## 2020-06-03 DIAGNOSIS — R55 Syncope and collapse: Secondary | ICD-10-CM

## 2020-06-03 DIAGNOSIS — I69222 Dysarthria following other nontraumatic intracranial hemorrhage: Secondary | ICD-10-CM

## 2020-06-03 DIAGNOSIS — I252 Old myocardial infarction: Secondary | ICD-10-CM

## 2020-06-03 DIAGNOSIS — S064XAA Epidural hemorrhage with loss of consciousness status unknown, initial encounter: Secondary | ICD-10-CM

## 2020-06-03 DIAGNOSIS — I4510 Unspecified right bundle-branch block: Secondary | ICD-10-CM | POA: Diagnosis present

## 2020-06-03 DIAGNOSIS — R1310 Dysphagia, unspecified: Secondary | ICD-10-CM | POA: Diagnosis present

## 2020-06-03 DIAGNOSIS — I69028 Other speech and language deficits following nontraumatic subarachnoid hemorrhage: Secondary | ICD-10-CM

## 2020-06-03 DIAGNOSIS — R778 Other specified abnormalities of plasma proteins: Secondary | ICD-10-CM

## 2020-06-03 LAB — BASIC METABOLIC PANEL
Anion gap: 12 (ref 5–15)
BUN: 26 mg/dL — ABNORMAL HIGH (ref 8–23)
CO2: 24 mmol/L (ref 22–32)
Calcium: 9.2 mg/dL (ref 8.9–10.3)
Chloride: 100 mmol/L (ref 98–111)
Creatinine, Ser: 1.32 mg/dL — ABNORMAL HIGH (ref 0.61–1.24)
GFR, Estimated: 53 mL/min — ABNORMAL LOW (ref >60)
Glucose, Bld: 125 mg/dL — ABNORMAL HIGH (ref 70–99)
Potassium: 3.7 mmol/L (ref 3.5–5.1)
Sodium: 136 mmol/L (ref 135–145)

## 2020-06-03 LAB — RESP PANEL BY RT-PCR (FLU A&B, COVID) ARPGX2
Influenza A by PCR: NEGATIVE
Influenza B by PCR: NEGATIVE
SARS Coronavirus 2 by RT PCR: NEGATIVE

## 2020-06-03 LAB — CBC WITH DIFFERENTIAL/PLATELET
Abs Immature Granulocytes: 0.01 10*3/uL (ref 0.00–0.07)
Basophils Absolute: 0 10*3/uL (ref 0.0–0.1)
Basophils Relative: 0 %
Eosinophils Absolute: 0.3 10*3/uL (ref 0.0–0.5)
Eosinophils Relative: 5 %
HCT: 33.5 % — ABNORMAL LOW (ref 39.0–52.0)
Hemoglobin: 11.1 g/dL — ABNORMAL LOW (ref 13.0–17.0)
Immature Granulocytes: 0 %
Lymphocytes Relative: 17 %
Lymphs Abs: 0.8 10*3/uL (ref 0.7–4.0)
MCH: 29.8 pg (ref 26.0–34.0)
MCHC: 33.1 g/dL (ref 30.0–36.0)
MCV: 89.8 fL (ref 80.0–100.0)
Monocytes Absolute: 0.4 10*3/uL (ref 0.1–1.0)
Monocytes Relative: 9 %
Neutro Abs: 3.3 10*3/uL (ref 1.7–7.7)
Neutrophils Relative %: 69 %
Platelets: 120 10*3/uL — ABNORMAL LOW (ref 150–400)
RBC: 3.73 MIL/uL — ABNORMAL LOW (ref 4.22–5.81)
RDW: 14.8 % (ref 11.5–15.5)
WBC: 4.8 10*3/uL (ref 4.0–10.5)
nRBC: 0 % (ref 0.0–0.2)

## 2020-06-03 LAB — TROPONIN I (HIGH SENSITIVITY)
Troponin I (High Sensitivity): 83 ng/L — ABNORMAL HIGH (ref <18)
Troponin I (High Sensitivity): 86 ng/L — ABNORMAL HIGH (ref <18)

## 2020-06-03 MED ORDER — ACETAMINOPHEN 325 MG PO TABS: 650.0000 mg | ORAL_TABLET | Freq: Four times a day (QID) | ORAL | Status: DC | PRN

## 2020-06-03 MED ORDER — SENNOSIDES-DOCUSATE SODIUM 8.6-50 MG PO TABS
2.0000 | ORAL_TABLET | Freq: Two times a day (BID) | ORAL | Status: DC
  Administered 2020-06-04 – 2020-06-05 (×3): 2 via ORAL
  Filled 2020-06-03 (×3): qty 2

## 2020-06-03 MED ORDER — MEMANTINE HCL ER 7 MG PO CP24
21.0000 mg | ORAL_CAPSULE | Freq: Every day | ORAL | Status: DC
  Administered 2020-06-04 – 2020-06-05 (×2): 21 mg via ORAL
  Filled 2020-06-03 (×2): qty 1

## 2020-06-03 MED ORDER — LEVOTHYROXINE SODIUM 50 MCG PO TABS
75.0000 ug | ORAL_TABLET | Freq: Every day | ORAL | Status: DC
  Administered 2020-06-05: 06:00:00 75 ug via ORAL
  Filled 2020-06-03: qty 2

## 2020-06-03 MED ORDER — SODIUM CHLORIDE 0.9% FLUSH
3.0000 mL | Freq: Two times a day (BID) | INTRAVENOUS | Status: DC
  Administered 2020-06-03 – 2020-06-04 (×2): 3 mL via INTRAVENOUS

## 2020-06-03 MED ORDER — ADULT MULTIVITAMIN W/MINERALS CH
1.0000 | ORAL_TABLET | Freq: Every day | ORAL | Status: DC
  Administered 2020-06-04 – 2020-06-05 (×2): 1 via ORAL
  Filled 2020-06-03 (×2): qty 1

## 2020-06-03 MED ORDER — ONDANSETRON HCL 4 MG PO TABS: 4.0000 mg | ORAL_TABLET | Freq: Four times a day (QID) | ORAL | Status: DC | PRN

## 2020-06-03 MED ORDER — GALANTAMINE HYDROBROMIDE 4 MG PO TABS
12.0000 mg | ORAL_TABLET | Freq: Two times a day (BID) | ORAL | Status: DC
  Administered 2020-06-04 – 2020-06-05 (×4): 12 mg via ORAL
  Filled 2020-06-03 (×5): qty 3

## 2020-06-03 MED ORDER — SIMVASTATIN 40 MG PO TABS
40.0000 mg | ORAL_TABLET | Freq: Every day | ORAL | Status: DC
  Administered 2020-06-04 – 2020-06-05 (×2): 40 mg via ORAL
  Filled 2020-06-03: qty 1
  Filled 2020-06-03: qty 2

## 2020-06-03 MED ORDER — EZETIMIBE 10 MG PO TABS
10.0000 mg | ORAL_TABLET | Freq: Every day | ORAL | Status: DC
  Administered 2020-06-04 – 2020-06-05 (×2): 10 mg via ORAL
  Filled 2020-06-03 (×2): qty 1

## 2020-06-03 MED ORDER — STROKE: EARLY STAGES OF RECOVERY BOOK: Freq: Once | Status: DC

## 2020-06-03 MED ORDER — SERTRALINE HCL 50 MG PO TABS
50.0000 mg | ORAL_TABLET | Freq: Every day | ORAL | Status: DC
  Administered 2020-06-05: 11:00:00 50 mg via ORAL
  Filled 2020-06-03 (×4): qty 1

## 2020-06-03 MED ORDER — NITROGLYCERIN 0.4 MG SL SUBL: 0.4000 mg | SUBLINGUAL_TABLET | SUBLINGUAL | Status: DC

## 2020-06-03 MED ORDER — ACETAMINOPHEN 650 MG RE SUPP: 650.0000 mg | Freq: Four times a day (QID) | RECTAL | Status: DC | PRN

## 2020-06-03 MED ORDER — SODIUM CHLORIDE 0.9 % IV BOLUS
1000.0000 mL | Freq: Once | INTRAVENOUS | Status: AC
  Administered 2020-06-03: 23:00:00 1000 mL via INTRAVENOUS

## 2020-06-03 MED ORDER — PANTOPRAZOLE SODIUM 40 MG PO TBEC
40.0000 mg | DELAYED_RELEASE_TABLET | Freq: Every day | ORAL | Status: DC
  Administered 2020-06-04 – 2020-06-05 (×2): 40 mg via ORAL
  Filled 2020-06-03 (×2): qty 1

## 2020-06-03 MED ORDER — ONDANSETRON HCL 4 MG/2ML IJ SOLN: 4.0000 mg | Freq: Four times a day (QID) | INTRAMUSCULAR | Status: DC | PRN

## 2020-06-03 NOTE — ED Notes (Signed)
Patient transported to CT 

## 2020-06-03 NOTE — ED Triage Notes (Signed)
Pt arrives [facility] from [facility] w cc of possible syncopal episode. Per facility, pt was sitting at nursing station when he slumped over in chair. Upon EMS arrival, pt was supine on floor but per staff did not fall. Pt was alert and back to baseline w EMS. Hx dementia and TIA.  BP 112/64 HR 70-80 NSR O2 96% room air CBG 144 temp 97

## 2020-06-03 NOTE — ED Provider Notes (Signed)
[facility] Emergency Department Provider Note   ____________________________________________   Event Date/Time   First MD Initiated Contact with Patient 06/03/20 2105     (approximate)  I have reviewed the triage vital signs and the nursing notes.   HISTORY  Chief Complaint Loss of Consciousness    HPI Alex [name] is a 85 y.o. male with possible history of hypertension, hyperlipidemia, CAD, stroke, and dementia who presents to the ED for syncopal episode. History is limited due to patient's dementia. Per EMS, patient was walking out to the nurses station at his nursing facility when he suddenly seemed to lose consciousness and slumped over in a chair. When EMS arrived, he was lying down on the ground but staff stated he did not fall. He was initially slow to respond, but gradually returned to his reported baseline mental status. Patient currently denies any complaints, specifically denies any chest pain or shortness of breath. He has been off anticoagulation following epidural hematoma in November and subsequent subdural hematoma earlier this month, which required bur hole.        Past Medical History:  Diagnosis Date  . Arthritis   . CAD (coronary artery disease)    post CABG; myoview 2009 neg  . Carotid artery disease (HCC)    Mild  . Cirrhosis of liver (HCC)   . Dementia (HCC)   . GERD (gastroesophageal reflux disease)   . Hepatitis C    Inactive  . Hyperlipidemia   . Hypertension   . Idiopathic thrombocytopenia (HCC)   . Memory loss   . Myocardial infarction [facility]) 1996   [place]  . RBBB (right bundle branch block)   . Right bundle branch block (RBBB)   . Right tibial fracture 2014  . Stroke [facility]) February 2016   [facility]  . Thrombocytopenia (HCC)   . TIA (transient ischemic attack)   . TIA (transient ischemic attack)     Patient Active Problem List   Diagnosis Date Noted  . Dementia (HCC) 06/03/2020  . History of subdural  hematoma 05/16/20  06/03/2020  . Syncope 06/03/2020  . H/O Epidural hematoma 04/2020 (HCC)  06/03/2020  . Hx of CABG 06/03/2020  . Syncope and collapse 06/03/2020  . Elevated troponin 06/03/2020  . Hypotension 06/03/2020  . AKI (acute kidney injury) (HCC) 06/03/2020  . Acquired hypothyroidism 12/21/2016  . Angina pectoris (HCC)   . Pain in the chest   . History of stroke   . Essential hypertension   . Status post right partial knee replacement 06/14/2015  . CVA (cerebral vascular accident) (HCC) 11/05/2014  . History of recent fall 05/10/2014  . Broken nose 05/10/2014  . Hepatic cirrhosis due to chronic hepatitis C infection (HCC) 11/09/2013  . Chronic viral hepatitis C (HCC) 01/29/2013  . Malaise 01/07/2012  . Dizziness 12/15/2010  . Carotid stenosis 07/04/2009  . PALPITATIONS 03/23/2009  . Hyperlipidemia 12/26/2008  . HYPERTENSION, BENIGN 12/26/2008  . CAD (coronary artery disease) 12/26/2008    Past Surgical History:  Procedure Laterality Date  . CORONARY ARTERY BYPASS GRAFT    . EYE SURGERY Bilateral    Cataract Extraction  . FRACTURE SURGERY Right 1981   Rod in right femur  . JOINT REPLACEMENT    . LEG SURGERY  2014   right leg  . PARTIAL KNEE ARTHROPLASTY Right 06/14/2015   Procedure: UNICOMPARTMENTAL KNEE;  Surgeon: Christena Flake, MD;  Location: [facility] ORS;  Service: Orthopedics;  Laterality: Right;  . TIBIA FRACTURE SURGERY Right  2014   Rod in tibia  . UPPER GASTROINTESTINAL ENDOSCOPY      Prior to Admission medications   Medication Sig Start Date End Date Taking? Authorizing Provider  acetaminophen (TYLENOL) 325 MG tablet Take 650 mg by mouth 3 (three) times daily. 05/24/20 06/03/20 Yes [provider]  Cyanocobalamin (B-12 PO) Take 1,000 mg by mouth daily.    Yes [provider]  ezetimibe (ZETIA) 10 MG tablet Take 1 tablet (10 mg total) by mouth daily. 02/03/20 05/03/20 Yes Gollan, Tollie Pizzaimothy J, MD  galantamine (RAZADYNE) 12 MG tablet Take 12 mg by  mouth 2 (two) times daily.  02/26/13  Yes [provider]  hydrocortisone 2.5 % cream Apply 1 application topically daily as needed.   Yes [provider]  irbesartan (AVAPRO) 300 MG tablet Take 0.5 tablets (150 mg total) by mouth daily. 03/23/19  Yes Gollan, Tollie Pizzaimothy J, MD  levothyroxine (SYNTHROID, LEVOTHROID) 75 MCG tablet Take 75 mcg by mouth daily. 12/21/16  Yes [provider]  memantine (NAMENDA XR) 21 MG CP24 24 hr capsule Take 21 mg by mouth daily.  04/13/15  Yes [provider]  Multiple Vitamins-Minerals (CENTRUM SILVER) tablet Take 1 tablet by mouth daily.     Yes [provider]  nitroGLYCERIN (NITROSTAT) 0.4 MG SL tablet Place 1 tablet (0.4 mg total) under the tongue as directed. 10/23/16  Yes Antonieta IbaGollan, Timothy J, MD  omeprazole (PRILOSEC) 40 MG capsule Take 40 mg by mouth daily. 10/08/17  Yes [provider]  QUEtiapine (SEROQUEL) 25 MG tablet Take 25 mg by mouth daily as needed for agitation. 05/24/20 06/23/20 Yes [provider]  senna-docusate (SENOKOT-S) 8.6-50 MG tablet Take 2 tablets by mouth 2 (two) times daily. 05/24/20 08/22/20 Yes [provider]  sertraline (ZOLOFT) 50 MG tablet Take 50 mg by mouth daily.  03/11/15  Yes [provider]  simvastatin (ZOCOR) 40 MG tablet TAKE ONE TABLET BY MOUTH AT BEDTIME 09/09/19  Yes Gollan, Tollie Pizzaimothy J, MD    Allergies Aspirin, Aspirin-dipyridamole er, Aspirin-dipyridamole er, Ciprofloxacin, Contrast media [iodinated diagnostic agents], Morphine, Other, and Venlafaxine  Family History  Problem Relation Age of Onset  . Diabetes Neg Hx   . Coronary artery disease Neg Hx     Social History Social History   Tobacco Use  . Smoking status: Never Smoker  . Smokeless tobacco: Never Used  Substance Use Topics  . Alcohol use: No  . Drug use: No    Review of Systems  Constitutional: No fever/chills Eyes: No visual changes. ENT: No sore throat. Cardiovascular:  Denies chest pain.  Positive for syncope. Respiratory: Denies shortness of breath. Gastrointestinal: No abdominal pain.  No nausea, no vomiting.  No diarrhea.  No constipation. Genitourinary: Negative for dysuria. Musculoskeletal: Negative for back pain. Skin: Negative for rash. Neurological: Negative for headaches, focal weakness or numbness.  ____________________________________________   PHYSICAL EXAM:  VITAL SIGNS: ED Triage Vitals  Enc Vitals Group     BP 06/03/20 2102 105/66     Pulse Rate 06/03/20 2102 84     Resp 06/03/20 2102 13     Temp 06/03/20 2102 (!) 97.4 F (36.3 C)     Temp Source 06/03/20 2102 Oral     SpO2 06/03/20 2101 95 %     Weight 06/03/20 2103 164 lb 11.2 oz (74.7 kg)     Height 06/03/20 2103 5\' 10"  (1.778 m)     Head Circumference --      Peak Flow --  Pain Score --      Pain Loc --      Pain Edu? --      Excl. in GC? --     Constitutional: Alert and oriented to person, but not place or time. Eyes: Conjunctivae are normal.  Pupils equal round and reactive to light bilaterally. Head: Atraumatic.  Bur hole sites clean, dry, and intact. Nose: No congestion/rhinnorhea. Mouth/Throat: Mucous membranes are moist. Neck: Normal ROM, no midline cervical spine tenderness. Cardiovascular: Normal rate, regular rhythm. Grossly normal heart sounds. Respiratory: Normal respiratory effort.  No retractions. Lungs CTAB. Gastrointestinal: Soft and nontender. No distention. Genitourinary: deferred Musculoskeletal: No lower extremity tenderness nor edema. Neurologic: Slurred speech noted. No gross focal neurologic deficits are appreciated. Skin:  Skin is warm, dry and intact. No rash noted. Psychiatric: Mood and affect are normal. Speech and behavior are normal.  ____________________________________________   LABS (all labs ordered are listed, but only abnormal results are displayed)  Labs Reviewed  CBC WITH DIFFERENTIAL/PLATELET - Abnormal; Notable for  the following components:      Result Value   RBC 3.73 (*)    Hemoglobin 11.1 (*)    HCT 33.5 (*)    Platelets 120 (*)    All other components within normal limits  BASIC METABOLIC PANEL - Abnormal; Notable for the following components:   Glucose, Bld 125 (*)    BUN 26 (*)    Creatinine, Ser 1.32 (*)    GFR, Estimated 53 (*)    All other components within normal limits  TROPONIN I (HIGH SENSITIVITY) - Abnormal; Notable for the following components:   Troponin I (High Sensitivity) 83 (*)    All other components within normal limits  TROPONIN I (HIGH SENSITIVITY) - Abnormal; Notable for the following components:   Troponin I (High Sensitivity) 86 (*)    All other components within normal limits  RESP PANEL BY RT-PCR (FLU A&B, COVID) ARPGX2  URINALYSIS, COMPLETE (UACMP) WITH MICROSCOPIC  HEMOGLOBIN A1C  LIPID PANEL   ____________________________________________  EKG  ED ECG REPORT I, Chesley Noon, the attending physician, personally viewed and interpreted this ECG.   Date: 06/03/2020  EKG Time: 21:00  Rate: 93  Rhythm: normal sinus rhythm  Axis: LAD  Intervals:nonspecific intraventricular conduction delay  ST&T Change: Anterolateral T wave inversions, similar to previous   PROCEDURES  Procedure(s) performed (including Critical Care):  Procedures   ____________________________________________   INITIAL IMPRESSION / ASSESSMENT AND PLAN / ED COURSE       86 year old male with past medical history of dementia, hypertension, hyperlipidemia, CAD, recent subdural hematoma requiring bur hole, and stroke who presents to the ED following apparent syncopal episode where he slumped to the ground and became less responsive at his nursing facility.  On arrival to the ED he is awake and alert with slurred speech but no focal neurologic deficits.  Wife at bedside states this is his baseline since his discharge from [place].  CT head performed and shows residual subdural hematoma  with some pneumocephaly but no evidence of acute bleeding.  Case discussed with Dr. Adriana Simas of neurosurgery, who states pneumocephaly is improving from prior imaging at [facility] and no acute neurosurgical intervention needed.  EKG similar to previous but with nonspecific intraventricular conduction delay, troponin mildly elevated but patient denies any chest pain or shortness of breath.  Given apparent prolonged syncopal episode, we will admit patient for observation, patient and family agree with plan.      ____________________________________________   FINAL CLINICAL IMPRESSION(S) /  ED DIAGNOSES  Final diagnoses:  Syncope, unspecified syncope type  Subdural hematoma due to concussion, without loss of consciousness, subsequent encounter     ED Discharge Orders    None       Note:  This document was prepared using Dragon voice recognition software and may include unintentional dictation errors.   Chesley Noon, MD 06/03/20 [phone]

## 2020-06-03 NOTE — H&P (Addendum)
History and Physical    Alex [name]:[id number] DOB: Oct 16, 1932 DOA: 06/03/2020  PCP: Danella Penton, MD   Patient coming from: Home  I have personally briefly reviewed patient's old medical records in [facility]  Chief Complaint: Syncope  HPI: Alex [name] is a 86 y.o. male with medical history significant for HTN, CAD status post CABG, dementia, hypothyroidism, who suffered an epidural hematoma in November 2021, and then a nontraumatic subdural hematoma on 12/6 transferred to [facility] where he underwent bur holes and hematoma evacuation, with residual dysphagia and dysarthria, discharged to SNF on 12/15 who was in his usual state of health until the night of arrival at the nursing home when he slumped over in his chair and was minimally responsive and hypotensive.  The nursing home called as a possible cardiac arrest however by arrival of EMS, patient was already coming around though lethargic.  Most of the history taken from ER report and subsequently by wife who arrived a bit later at the hospital.  She said that she saw her husband earlier in the day and she thinks he is back to his baseline but she is concerned that his speech seemed a little more slurred than his most recent baseline and a previous left facial droop appeared more prominent.  She also voiced concern that during her visit earlier in the day, her husband use the bathroom at least 4 times during her visit which is unusual. ED Course: By arrival in the ED, patient was awake, alert and back to baseline.  He denied chest pain or shortness of breath, cough fever or chills.  Lightheadedness, headache weakness in face arm or leg.  Vitals, temperature 97.4, BP 105/66, pulse 84 O2 sat 95% on room air.  Blood work significant for hemoglobin 11.1, creatinine 1.32, troponin 83. EKG as reviewed by me : Normal sinus rhythm at 93 with right bundle branch block Imaging: CT head and C-spine showed postsurgical changes from previous  evacuation of right subdural hematoma.... C-spine no evidence of acute trauma The emergency room provider spoke with neurosurgeon, Dr. Adriana Simas, who compared CT head images with images from discharge at [facility] and so no new hemorrhage and no recurrent severe mass-effect.  Recommended plan follow-up as outpatient  Review of Systems: Unable to reliably obtain due to dementia  Past Medical History:  Diagnosis Date  . Arthritis   . CAD (coronary artery disease)    post CABG; myoview 2009 neg  . Carotid artery disease (HCC)    Mild  . Cirrhosis of liver (HCC)   . Dementia (HCC)   . GERD (gastroesophageal reflux disease)   . Hepatitis C    Inactive  . Hyperlipidemia   . Hypertension   . Idiopathic thrombocytopenia (HCC)   . Memory loss   . Myocardial infarction [facility]) 1996   [place]  . RBBB (right bundle branch block)   . Right bundle branch block (RBBB)   . Right tibial fracture 2014  . Stroke [facility]) February 2016   [facility]  . Thrombocytopenia (HCC)   . TIA (transient ischemic attack)   . TIA (transient ischemic attack)     Past Surgical History:  Procedure Laterality Date  . CORONARY ARTERY BYPASS GRAFT    . EYE SURGERY Bilateral    Cataract Extraction  . FRACTURE SURGERY Right 1981   Rod in right femur  . JOINT REPLACEMENT    . LEG SURGERY  2014   right leg  . PARTIAL KNEE ARTHROPLASTY  Right 06/14/2015   Procedure: UNICOMPARTMENTAL KNEE;  Surgeon: Christena FlakeJohn J Poggi, MD;  Location: [facility] ORS;  Service: Orthopedics;  Laterality: Right;  . TIBIA FRACTURE SURGERY Right 2014   Rod in tibia  . UPPER GASTROINTESTINAL ENDOSCOPY       reports that he has never smoked. He has never used smokeless tobacco. He reports that he does not drink alcohol and does not use drugs.  Allergies  Allergen Reactions  . Aspirin Other (See Comments)    "unknown"  . Aspirin-Dipyridamole Er Other (See Comments)    "unknown"  . Aspirin-Dipyridamole Er     aggrenox  . Ciprofloxacin Other (See Comments)     "burning sensation" Other reaction(s): OTHER  . Contrast Media [Iodinated Diagnostic Agents]     Burning when having mri contrast dye -   . Morphine Other (See Comments)    "unknown" Other reaction(s): OTHER  . Other Other (See Comments)    Other reaction(s): Unknown  Other reaction(s): OTHER Burning when having mri contrast dye -  Other reaction(s): OTHER   . Venlafaxine Other (See Comments)    "unknown" Other reaction(s): OTHER  AKA Effexor Other reaction(s): OTHER     Family History  Problem Relation Age of Onset  . Diabetes Neg Hx   . Coronary artery disease Neg Hx       Prior to Admission medications   Medication Sig Start Date End Date Taking? Authorizing Provider  aspirin 81 MG tablet Take 81 mg by mouth daily.    [provider]  clopidogrel (PLAVIX) 75 MG tablet Take 75 mg by mouth daily.    [provider]  Cyanocobalamin (B-12 PO) Take 1,000 mg by mouth daily.     [provider]  ezetimibe (ZETIA) 10 MG tablet Take 1 tablet (10 mg total) by mouth daily. 02/03/20 05/03/20  Antonieta IbaGollan, Timothy J, MD  galantamine (RAZADYNE) 8 MG tablet Take 12 mg by mouth 2 (two) times daily.  02/26/13   [provider]  irbesartan (AVAPRO) 300 MG tablet Take 0.5 tablets (150 mg total) by mouth daily. 03/23/19   Antonieta IbaGollan, Timothy J, MD  levothyroxine (SYNTHROID, LEVOTHROID) 75 MCG tablet Take 75 mcg by mouth daily. 12/21/16   [provider]  Memantine HCl ER (NAMENDA XR) 21 MG CP24 Take 21 mg by mouth daily.  04/13/15   [provider]  Multiple Vitamins-Minerals (CENTRUM SILVER) tablet Take 1 tablet by mouth daily.      [provider]  nitroGLYCERIN (NITROSTAT) 0.4 MG SL tablet Place 1 tablet (0.4 mg total) under the tongue as directed. 10/23/16   Antonieta IbaGollan, Timothy J, MD  omeprazole (PRILOSEC) 40 MG capsule Take 40 mg by mouth daily. 10/08/17   [provider]  sertraline (ZOLOFT) 50 MG tablet Take 50 mg by mouth daily.   03/11/15   [provider]  simvastatin (ZOCOR) 40 MG tablet TAKE ONE TABLET BY MOUTH AT BEDTIME 09/09/19   Antonieta IbaGollan, Timothy J, MD    Physical Exam: Vitals:   06/03/20 2101 06/03/20 2102 06/03/20 2103  BP:  105/66   Pulse:  84   Resp:  13   Temp:  (!) 97.4 F (36.3 C)   TempSrc:  Oral   SpO2: 95% 96%   Weight:   74.7 kg  Height:   5\' 10"  (1.778 m)     Vitals:   06/03/20 2101 06/03/20 2102 06/03/20 2103  BP:  105/66   Pulse:  84   Resp:  13   Temp:  Marland Kitchen(!)  97.4 F (36.3 C)   TempSrc:  Oral   SpO2: 95% 96%   Weight:   74.7 kg  Height:   5\' 10"  (1.778 m)      Constitutional:  Somewhat oriented x 2 . Not in any apparent distress HEENT:      Head: Normocephalic and atraumatic.         Eyes: PERLA, EOMI, Conjunctivae are normal. Sclera is non-icteric.       Mouth/Throat: Mucous membranes are moist.       Neck: Supple with no signs of meningismus. Cardiovascular: Regular rate and rhythm. No murmurs, gallops, or rubs. 2+ symmetrical distal pulses are present . No JVD. No LE edema Respiratory: Respiratory effort normal .Lungs sounds clear bilaterally. No wheezes, crackles, or rhonchi.  Gastrointestinal: Soft, non tender, and non distended with positive bowel sounds.  Genitourinary: No CVA tenderness. Musculoskeletal: Nontender with normal range of motion in all extremities. No cyanosis, or erythema of extremities. Neurologic: Minimal left-sided droop.  Equal strength 5/5 all extremities  Skin: Skin is warm, dry.  No rash or ulcers Psychiatric: Somewhat flat affect, restless at times.   Labs on Admission: I have personally reviewed following labs and imaging studies  CBC: Recent Labs  Lab 06/03/20 2059  WBC 4.8  NEUTROABS 3.3  HGB 11.1*  HCT 33.5*  MCV 89.8  PLT 120*   Basic Metabolic Panel: Recent Labs  Lab 06/03/20 2059  NA 136  K 3.7  CL 100  CO2 24  GLUCOSE 125*  BUN 26*  CREATININE 1.32*  CALCIUM 9.2   GFR: Estimated Creatinine Clearance: 43  mL/min (A) (by C-G formula based on SCr of 1.32 mg/dL (H)). Liver Function Tests: No results for input(s): AST, ALT, ALKPHOS, BILITOT, PROT, ALBUMIN in the last 168 hours. No results for input(s): LIPASE, AMYLASE in the last 168 hours. No results for input(s): AMMONIA in the last 168 hours. Coagulation Profile: No results for input(s): INR, PROTIME in the last 168 hours. Cardiac Enzymes: No results for input(s): CKTOTAL, CKMB, CKMBINDEX, TROPONINI in the last 168 hours. BNP (last 3 results) No results for input(s): PROBNP in the last 8760 hours. HbA1C: No results for input(s): HGBA1C in the last 72 hours. CBG: No results for input(s): GLUCAP in the last 168 hours. Lipid Profile: No results for input(s): CHOL, HDL, LDLCALC, TRIG, CHOLHDL, LDLDIRECT in the last 72 hours. Thyroid Function Tests: No results for input(s): TSH, T4TOTAL, FREET4, T3FREE, THYROIDAB in the last 72 hours. Anemia Panel: No results for input(s): VITAMINB12, FOLATE, FERRITIN, TIBC, IRON, RETICCTPCT in the last 72 hours. Urine analysis:    Component Value Date/Time   COLORURINE YELLOW (A) 05/16/2020 1051   APPEARANCEUR CLEAR (A) 05/16/2020 1051   APPEARANCEUR Clear 04/04/2013 1500   LABSPEC 1.015 05/16/2020 1051   LABSPEC 1.008 04/04/2013 1500   PHURINE 6.0 05/16/2020 1051   GLUCOSEU NEGATIVE 05/16/2020 1051   GLUCOSEU 50 mg/dL 14/11/2019 81/82/9937   HGBUR NEGATIVE 05/16/2020 1051   BILIRUBINUR NEGATIVE 05/16/2020 1051   BILIRUBINUR Negative 04/04/2013 1500   KETONESUR NEGATIVE 05/16/2020 1051   PROTEINUR NEGATIVE 05/16/2020 1051   NITRITE NEGATIVE 05/16/2020 1051   LEUKOCYTESUR TRACE (A) 05/16/2020 1051   LEUKOCYTESUR Negative 04/04/2013 1500    Radiological Exams on Admission: CT Head Wo Contrast  Result Date: 06/03/2020 CLINICAL DATA:  Possible syncopal episode, found down EXAM: CT HEAD WITHOUT CONTRAST CT CERVICAL SPINE WITHOUT CONTRAST TECHNIQUE: Multidetector CT imaging of the head and cervical spine  was performed following the standard protocol  without intravenous contrast. Multiplanar CT image reconstructions of the cervical spine were also generated. COMPARISON:  CT head 05/16/2020, CT head and cervical spine 04/20/2020 FINDINGS: CT HEAD FINDINGS Brain: Patient appears to have undergone prior evacuation of the previously seen right subdural collection with placement small frontal and parietal burr holes. There is some regions of thin subjacent thickening and heterogeneity, likely dural thickening adjacent the evacuation sites however, there are some persistent lucencies of gas layering anti dependently within a residual collection which measures up to 8 mm in maximal thickness. Some more dependently layering intermediate attenuation is seen within this collection as well which could reflect some mixed age blood products. Some persistent leftward mediastinal shift of approximately 3 mm. No evidence of acute infarction, hydrocephalus, or new extra-axial collection. Symmetric prominence of the ventricles, cisterns and sulci compatible with parenchymal volume loss. Patchy areas of white matter hypoattenuation are most compatible with chronic microvascular angiopathy. Vascular: Atherosclerotic calcification of the carotid siphons and intradural vertebral arteries. No hyperdense vessel. Skull: Postsurgical changes from burr hole placement in the right frontal and parietal bones with subjacent dural thickening. Mild overlying scalp thickening is likely postsurgical as well. No other acute or worrisome calvarial abnormalities. Sinuses/Orbits: Diffuse mural thickening in ethmoids with some pneumatized secretions in the right sphenoid sinus. Mastoids and middle ear cavities are clear. Included orbital structures are unremarkable. Other: None CT CERVICAL SPINE FINDINGS Alignment: Chronic straightening and slight reversal the normal cervical lordosis. No evidence of traumatic listhesis. No abnormally widened, perched or  jumped facets. Normal alignment of the craniocervical and atlantoaxial articulations. Skull base and vertebrae: No acute skull base fracture. No vertebral body fracture or height loss. Normal bone mineralization. No worrisome osseous lesions. Moderate atlantodental and basion dens arthrosis with early calcific pannus formation. Additional cervical spondylitic changes as below. Soft tissues and spinal canal: No pre or paravertebral fluid or swelling. No visible canal hematoma. Disc levels: Multilevel intervertebral disc height loss with spondylitic endplate changes. Features are most pronounced C5-6 with an exuberant, calcified central posterior disc osteophyte complex. Resulting in some moderate canal stenosis. Additional smaller disc osteophyte complexes are present C3-4, C4-5 and C6-7 resulting only partial effacement of the ventral thecal sac. Disc height loss however is most pronounced at the C6-7 level with more exuberant anterior osteophytosis. Multilevel uncinate spurring facet hypertrophic changes in the cervical spine, maximal C6-7 resulting in at most moderate foraminal narrowing. Upper chest: No acute abnormality in the upper chest or imaged lung apices. Other: Cervical carotid atherosclerosis.  Normal thyroid. IMPRESSION: 1. Postsurgical changes from evacuation of the previously seen right subdural collection with placement small frontal and parietal burr holes. Dural thickening and heterogeneity is seen adjacent the evacuation sites, nonspecific. Persistent pneumo cephaly layering antidependently and some mild heterogeneous material reflecting mixed age blood products is seen layering dependently is somewhat conspicuous given the time since operative intervention. Minimal residual leftward midline shift of 3 mm. 2. No evidence of acute fracture or traumatic listhesis of the cervical spine. 3. Multilevel cervical spondylitic changes, most pronounced at C5-6 with moderate canal stenosis, and at C6-7 where  there is moderate bilateral foraminal narrowing. 4. Cervical and intracranial atherosclerosis. These results were called by telephone at the time of interpretation on 06/03/2020 at 9:50 pm to provider [facility] , who verbally acknowledged these results. Electronically Signed   By: Kreg Shropshire M.D.   On: 06/03/2020 21:50   CT Cervical Spine Wo Contrast  Result Date: 06/03/2020 CLINICAL DATA:  Possible syncopal episode,  found down EXAM: CT HEAD WITHOUT CONTRAST CT CERVICAL SPINE WITHOUT CONTRAST TECHNIQUE: Multidetector CT imaging of the head and cervical spine was performed following the standard protocol without intravenous contrast. Multiplanar CT image reconstructions of the cervical spine were also generated. COMPARISON:  CT head 05/16/2020, CT head and cervical spine 04/20/2020 FINDINGS: CT HEAD FINDINGS Brain: Patient appears to have undergone prior evacuation of the previously seen right subdural collection with placement small frontal and parietal burr holes. There is some regions of thin subjacent thickening and heterogeneity, likely dural thickening adjacent the evacuation sites however, there are some persistent lucencies of gas layering anti dependently within a residual collection which measures up to 8 mm in maximal thickness. Some more dependently layering intermediate attenuation is seen within this collection as well which could reflect some mixed age blood products. Some persistent leftward mediastinal shift of approximately 3 mm. No evidence of acute infarction, hydrocephalus, or new extra-axial collection. Symmetric prominence of the ventricles, cisterns and sulci compatible with parenchymal volume loss. Patchy areas of white matter hypoattenuation are most compatible with chronic microvascular angiopathy. Vascular: Atherosclerotic calcification of the carotid siphons and intradural vertebral arteries. No hyperdense vessel. Skull: Postsurgical changes from burr hole placement in the  right frontal and parietal bones with subjacent dural thickening. Mild overlying scalp thickening is likely postsurgical as well. No other acute or worrisome calvarial abnormalities. Sinuses/Orbits: Diffuse mural thickening in ethmoids with some pneumatized secretions in the right sphenoid sinus. Mastoids and middle ear cavities are clear. Included orbital structures are unremarkable. Other: None CT CERVICAL SPINE FINDINGS Alignment: Chronic straightening and slight reversal the normal cervical lordosis. No evidence of traumatic listhesis. No abnormally widened, perched or jumped facets. Normal alignment of the craniocervical and atlantoaxial articulations. Skull base and vertebrae: No acute skull base fracture. No vertebral body fracture or height loss. Normal bone mineralization. No worrisome osseous lesions. Moderate atlantodental and basion dens arthrosis with early calcific pannus formation. Additional cervical spondylitic changes as below. Soft tissues and spinal canal: No pre or paravertebral fluid or swelling. No visible canal hematoma. Disc levels: Multilevel intervertebral disc height loss with spondylitic endplate changes. Features are most pronounced C5-6 with an exuberant, calcified central posterior disc osteophyte complex. Resulting in some moderate canal stenosis. Additional smaller disc osteophyte complexes are present C3-4, C4-5 and C6-7 resulting only partial effacement of the ventral thecal sac. Disc height loss however is most pronounced at the C6-7 level with more exuberant anterior osteophytosis. Multilevel uncinate spurring facet hypertrophic changes in the cervical spine, maximal C6-7 resulting in at most moderate foraminal narrowing. Upper chest: No acute abnormality in the upper chest or imaged lung apices. Other: Cervical carotid atherosclerosis.  Normal thyroid. IMPRESSION: 1. Postsurgical changes from evacuation of the previously seen right subdural collection with placement small  frontal and parietal burr holes. Dural thickening and heterogeneity is seen adjacent the evacuation sites, nonspecific. Persistent pneumo cephaly layering antidependently and some mild heterogeneous material reflecting mixed age blood products is seen layering dependently is somewhat conspicuous given the time since operative intervention. Minimal residual leftward midline shift of 3 mm. 2. No evidence of acute fracture or traumatic listhesis of the cervical spine. 3. Multilevel cervical spondylitic changes, most pronounced at C5-6 with moderate canal stenosis, and at C6-7 where there is moderate bilateral foraminal narrowing. 4. Cervical and intracranial atherosclerosis. These results were called by telephone at the time of interpretation on 06/03/2020 at 9:50 pm to provider [facility] , who verbally acknowledged these results. Electronically Signed  By: Kreg Shropshire M.D.   On: 06/03/2020 21:50     Assessment/Plan 86 year old male with history of HTN, CAD status post CABG, dementia, hypothyroidism, who suffered an epidural hematoma in November 2021, and then a nontraumatic subdural hematoma on 12/6 transferred to [facility] where he underwent burr holes and hematoma evacuation, discharged to SNF on 12/15 presenting with a syncopal episode .  SNF denies that patient did not fall.  Syncope  History of subdural hematoma 05/16/20 s/p evacuation with residual dysphagia and dysarthria.   H/O Epidural hematoma 04/2020 [facility]) -Patient presenting with a syncopal episode in the setting of recent nontraumatic subdural hematoma requiring bur holes and hematoma evacuation -CT head reviewed by neurosurgeon, Dr. Adriana Simas.  Please see note for details but no new hemorrhage was seen and no recurrent mass-effect and outpatient follow-up recommended -Patient back to baseline status at this time -Neurologic checks, with fall and aspiration precautions -Continuous cardiac monitoring,  -Low suspicion for stroke but will get MRI  due to concerns for worsening of slurred speech -(had unremarkable echo with grade 2 diastolic dysfunction, EF 50 to 55% 11/2019) -Addendum: Patient became hypotensive following admission so this might be etiology fo syncope, see below  Hypotension, with history of essential hypertension -Possibly medication related in combination with dehydration.   --hold irbesartan 300 -BP 88/70 following admission.  Patient also was hypotensive at the nursing home -Normal saline bolus ordered -Hold home antihypertensives -Continue to monitor blood pressure  AKI -- Creatinine 1.32, up from 0.9 at [facility] on 05/20/20 --likely prerenal and related to decreased oral intake from dysphagia resulting from recent subdural hematoma evacuation --IV hydration    Elevated troponin   CAD (coronary artery disease)   Hx of CABG -No reports of chest pain and EKG nonacute -Awaiting med rec for resumption of home meds -Continue to trend troponin.  -While at [facility] from 12/6-12/14, patient complained of chest pain on 2 occasions and according to discharge summary cardiac work-up was performed both times and it was normal.  He was believed to have stable angina. (Troponin while at [facility] 86>>73 on 12/10) -Followed by Dr. Mariah Milling  Dysphagia and Dysarthria --Resulting from recent subdural hematoma evacuation, per wife at bedside -No new neurologic deficits but wife concerned about more prominent slurred speech and left facial droop -At baseline patient requires thickened feeds -Speech therapy requested -N.p.o. till eval  History of CVA 2016 without residual deficits -Currently off antiplatelets since it subdural hematoma and has not been restarted -Getting MRI brain his wife had concern about worsening slurring of his speech    Essential hypertension -Blood pressure borderline low so holding antihypertensives    Acquired hypothyroidism -Continue levothyroxine    Dementia (HCC) -Patient had episodes of delirium on  review of discharge summary from [facility] so we will monitor for same -Continue home meds    DVT prophylaxis: SCDs given recent subdural hematoma evacuation Code Status: full code  Family Communication:  Wife at bedside  Disposition Plan: Back to previous home environment Consults called: none  Status: Observation    Andris Baumann MD [facility]     06/03/2020, 10:43 PM

## 2020-06-03 NOTE — Progress Notes (Signed)
Patient with fall and history of SDH evacuation on 12/6. Scan from 12/14 shows persistent moderate pneumocephalus. CT scan tonight shows minimal amount and this appears to be resolving. Given no new hemorrhage and no recurrent severe mass effect, no intervention needed. Can continue with planned follow up as outpatient.

## 2020-06-04 ENCOUNTER — Inpatient Hospital Stay: Payer: Medicare Other

## 2020-06-04 ENCOUNTER — Observation Stay: Payer: Medicare Other

## 2020-06-04 ENCOUNTER — Observation Stay (HOSPITAL_COMMUNITY)
Admit: 2020-06-04 | Discharge: 2020-06-04 | Disposition: A | Discharge status: Home / Self Care | Payer: Medicare Other | Attending: Internal Medicine | Admitting: Internal Medicine

## 2020-06-04 ENCOUNTER — Other Ambulatory Visit: Payer: Self-pay

## 2020-06-04 DIAGNOSIS — R1310 Dysphagia, unspecified: Secondary | ICD-10-CM | POA: Diagnosis present

## 2020-06-04 DIAGNOSIS — E039 Hypothyroidism, unspecified: Secondary | ICD-10-CM | POA: Diagnosis present

## 2020-06-04 DIAGNOSIS — F0391 Unspecified dementia with behavioral disturbance: Secondary | ICD-10-CM | POA: Diagnosis present

## 2020-06-04 DIAGNOSIS — S064X9D Epidural hemorrhage with loss of consciousness of unspecified duration, subsequent encounter: Secondary | ICD-10-CM | POA: Diagnosis not present

## 2020-06-04 DIAGNOSIS — I10 Essential (primary) hypertension: Secondary | ICD-10-CM | POA: Diagnosis present

## 2020-06-04 DIAGNOSIS — I340 Nonrheumatic mitral (valve) insufficiency: Secondary | ICD-10-CM | POA: Diagnosis not present

## 2020-06-04 DIAGNOSIS — R55 Syncope and collapse: Secondary | ICD-10-CM

## 2020-06-04 DIAGNOSIS — I69028 Other speech and language deficits following nontraumatic subarachnoid hemorrhage: Secondary | ICD-10-CM | POA: Diagnosis not present

## 2020-06-04 DIAGNOSIS — Z951 Presence of aortocoronary bypass graft: Secondary | ICD-10-CM | POA: Diagnosis not present

## 2020-06-04 DIAGNOSIS — K7460 Unspecified cirrhosis of liver: Secondary | ICD-10-CM | POA: Diagnosis present

## 2020-06-04 DIAGNOSIS — Z79899 Other long term (current) drug therapy: Secondary | ICD-10-CM | POA: Diagnosis not present

## 2020-06-04 DIAGNOSIS — N179 Acute kidney failure, unspecified: Secondary | ICD-10-CM | POA: Diagnosis present

## 2020-06-04 DIAGNOSIS — R29810 Facial weakness: Secondary | ICD-10-CM | POA: Diagnosis present

## 2020-06-04 DIAGNOSIS — Z96651 Presence of right artificial knee joint: Secondary | ICD-10-CM | POA: Diagnosis present

## 2020-06-04 DIAGNOSIS — I69222 Dysarthria following other nontraumatic intracranial hemorrhage: Secondary | ICD-10-CM | POA: Diagnosis not present

## 2020-06-04 DIAGNOSIS — Z8679 Personal history of other diseases of the circulatory system: Secondary | ICD-10-CM

## 2020-06-04 DIAGNOSIS — Z79890 Hormone replacement therapy (postmenopausal): Secondary | ICD-10-CM | POA: Diagnosis not present

## 2020-06-04 DIAGNOSIS — I252 Old myocardial infarction: Secondary | ICD-10-CM | POA: Diagnosis not present

## 2020-06-04 DIAGNOSIS — E861 Hypovolemia: Secondary | ICD-10-CM

## 2020-06-04 DIAGNOSIS — B182 Chronic viral hepatitis C: Secondary | ICD-10-CM | POA: Diagnosis present

## 2020-06-04 DIAGNOSIS — K219 Gastro-esophageal reflux disease without esophagitis: Secondary | ICD-10-CM | POA: Diagnosis present

## 2020-06-04 DIAGNOSIS — F05 Delirium due to known physiological condition: Secondary | ICD-10-CM | POA: Diagnosis present

## 2020-06-04 DIAGNOSIS — I9589 Other hypotension: Principal | ICD-10-CM

## 2020-06-04 DIAGNOSIS — E785 Hyperlipidemia, unspecified: Secondary | ICD-10-CM | POA: Diagnosis present

## 2020-06-04 DIAGNOSIS — I2510 Atherosclerotic heart disease of native coronary artery without angina pectoris: Secondary | ICD-10-CM | POA: Diagnosis present

## 2020-06-04 DIAGNOSIS — I4510 Unspecified right bundle-branch block: Secondary | ICD-10-CM | POA: Diagnosis present

## 2020-06-04 DIAGNOSIS — Z20822 Contact with and (suspected) exposure to covid-19: Secondary | ICD-10-CM | POA: Diagnosis present

## 2020-06-04 LAB — BASIC METABOLIC PANEL
Anion gap: 6 (ref 5–15)
BUN: 20 mg/dL (ref 8–23)
CO2: 28 mmol/L (ref 22–32)
Calcium: 8.6 mg/dL — ABNORMAL LOW (ref 8.9–10.3)
Chloride: 107 mmol/L (ref 98–111)
Creatinine, Ser: 0.98 mg/dL (ref 0.61–1.24)
GFR, Estimated: >60 mL/min (ref >60)
Glucose, Bld: 102 mg/dL — ABNORMAL HIGH (ref 70–99)
Potassium: 3.6 mmol/L (ref 3.5–5.1)
Sodium: 141 mmol/L (ref 135–145)

## 2020-06-04 LAB — ECHOCARDIOGRAM COMPLETE
AR max vel: 2.96 cm2
AV Area VTI: 3.2 cm2
AV Area mean vel: 2.87 cm2
AV Mean grad: 4 mmHg
AV Peak grad: 7.6 mmHg
Ao pk vel: 1.38 m/s
Area-P 1/2: 2.65 cm2
Height: 70 in
P 1/2 time: 420 msec
S' Lateral: 3.61 cm
Weight: 2417.6 oz

## 2020-06-04 LAB — URINALYSIS, COMPLETE (UACMP) WITH MICROSCOPIC
Bilirubin Urine: NEGATIVE
Glucose, UA: NEGATIVE mg/dL
Hgb urine dipstick: NEGATIVE
Ketones, ur: NEGATIVE mg/dL
Leukocytes,Ua: NEGATIVE
Nitrite: NEGATIVE
Protein, ur: NEGATIVE mg/dL
Specific Gravity, Urine: 1.005 (ref 1.005–1.030)
Squamous Epithelial / HPF: NONE SEEN (ref 0–5)
pH: 6 (ref 5.0–8.0)

## 2020-06-04 LAB — HEMOGLOBIN A1C
Hgb A1c MFr Bld: 5.1 % (ref 4.8–5.6)
Mean Plasma Glucose: 99.67 mg/dL

## 2020-06-04 LAB — LIPID PANEL
Cholesterol: 134 mg/dL (ref 0–200)
HDL: 41 mg/dL (ref >40)
LDL Cholesterol: 75 mg/dL (ref 0–99)
Total CHOL/HDL Ratio: 3.3 RATIO
Triglycerides: 88 mg/dL (ref <150)
VLDL: 18 mg/dL (ref 0–40)

## 2020-06-04 MED ORDER — LORAZEPAM 0.5 MG PO TABS: 0.5000 mg | ORAL_TABLET | Freq: Once | ORAL | Status: DC | PRN

## 2020-06-04 MED ORDER — PERFLUTREN LIPID MICROSPHERE
1.0000 mL | INTRAVENOUS | Status: AC | PRN
  Administered 2020-06-04: 09:00:00 3 mL via INTRAVENOUS
  Filled 2020-06-04: qty 10

## 2020-06-04 MED ORDER — QUETIAPINE FUMARATE 25 MG PO TABS: 25.0000 mg | ORAL_TABLET | Freq: Every evening | ORAL | Status: DC | PRN

## 2020-06-04 MED ORDER — LORAZEPAM 2 MG/ML IJ SOLN
1.0000 mg | Freq: Once | INTRAMUSCULAR | Status: AC | PRN
  Administered 2020-06-04: 01:00:00 1 mg via INTRAVENOUS
  Filled 2020-06-04: qty 1

## 2020-06-04 MED ORDER — LORAZEPAM 2 MG/ML IJ SOLN
0.5000 mg | Freq: Once | INTRAMUSCULAR | Status: AC | PRN
  Administered 2020-06-04: 11:00:00 0.5 mg via INTRAVENOUS
  Filled 2020-06-04: qty 1

## 2020-06-04 MED ORDER — LORAZEPAM 2 MG/ML IJ SOLN
1.0000 mg | Freq: Once | INTRAMUSCULAR | Status: AC
  Administered 2020-06-04: 02:00:00 1 mg via INTRAVENOUS
  Filled 2020-06-04: qty 1

## 2020-06-04 NOTE — Hospital Course (Signed)
From H&P by Dr. Para March: "Alex [name] is a 86 y.o. male with medical history significant for HTN, CAD status post CABG, dementia, hypothyroidism, who suffered an epidural hematoma in November 2021, and then a nontraumatic subdural hematoma on 12/6 transferred to [facility] where he underwent bur holes and hematoma evacuation, with residual dysphagia and dysarthria, discharged to SNF on 12/15 who was in his usual state of health until the night of arrival at the nursing home when he slumped over in his chair and was minimally responsive and hypotensive.  The nursing home called as a possible cardiac arrest however by arrival of EMS, patient was already coming around though lethargic.  Most of the history taken from ER report and subsequently by wife who arrived a bit later at the hospital.  She said that she saw her husband earlier in the day and she thinks he is back to his baseline but she is concerned that his speech seemed a little more slurred than his most recent baseline and a previous left facial droop appeared more prominent.  She also voiced concern that during her visit earlier in the day, her husband use the bathroom at least 4 times during her visit which is unusual. ED Course: By arrival in the ED, patient was awake, alert and back to baseline.  He denied chest pain or shortness of breath, cough fever or chills.  Lightheadedness, headache weakness in face arm or leg.  Vitals, temperature 97.4, BP 105/66, pulse 84 O2 sat 95% on room air.  Blood work significant for hemoglobin 11.1, creatinine 1.32, troponin 83. EKG as reviewed by me : Normal sinus rhythm at 93 with right bundle branch block Imaging: CT head and C-spine showed postsurgical changes from previous evacuation of right subdural hematoma.... C-spine no evidence of acute trauma The emergency room provider spoke with neurosurgeon, Dr. Adriana Simas, who compared CT head images with images from discharge at [facility] and so no new hemorrhage and no recurrent  severe mass-effect.  Recommended plan follow-up as outpatient"

## 2020-06-04 NOTE — Evaluation (Signed)
Physical Therapy Evaluation Patient Details Name: Alex [name] MRN: [id number] DOB: 25-Jul-1932 Today's Date: 06/04/2020   History of Present Illness  Alex [name] is a 86 y.o. male with medical history significant for HTN, CAD status post CABG, dementia, hypothyroidism, who suffered an epidural hematoma in November 2021, and then a nontraumatic subdural hematoma on 12/6 transferred to [facility] where he underwent bur holes and hematoma evacuation, with residual dysphagia and dysarthria, discharged to SNF on 12/15 who was in his usual state of health until the night of arrival at the nursing home when he slumped over in his chair and was minimally responsive and hypotensive.  The nursing home called as a possible cardiac arrest however by arrival of EMS, patient was already coming around though lethargic. By arrival in the ED, patient was awake, alert and back to baseline. CT head and C-spine showed postsurgical changes from previous evacuation of right subdural hematoma. C-spine no evidence of acute trauma  The emergency room provider spoke with neurosurgeon, Dr. Adriana Simas, who compared CT head images with images from discharge at [facility] and so no new hemorrhage and no recurrent severe mass-effect.  Recommended plan follow-up as outpatient. Patient became hypotensive following admission so this might be etiology fo syncope.    Clinical Impression  Patient is lethargic and has minimal response to commands. Also has dysphasia and dysarthria at recent baseline and history of dementia. He is not able to provide reliable history so history obtained from wife. She states that he was most recently at a SNF for short term rehab following hospitalization at [facility] for a subdural hematoma at the beginning of December. He was most recently ambulating with her in the hallways with no assistive device and needed some light assistance to get in and out of bed. Prior to his hospitalization at [facility], he ambulated in the home and  community with no AD but required supervision for safety due to baseline dementia. He also required assistance for ADLs and IADLs and attended adult daycare 3x a week. Upon PT evaluation, patient is very lethargic and rarely opens eyes. He mumbles occasionally and is unable to follow commands at least 90% of the time. He required max A for supine to sit bed mobility and max to min A for sit <> stand transfer with no AD. He was unable to attempt walking more than a few shuffling steps from bed to chair due to confusion and difficulty following commands. Patient appears to have experienced a decline in functional independence and would benefit from return to short term rehab prior to going home. Patient would benefit from skilled physical therapy to address impairments and functional limitations (see PT Problem List below) to work towards stated goals and return to PLOF or maximal functional independence.       Follow Up Recommendations SNF    Equipment Recommendations       Recommendations for Other Services       Precautions / Restrictions Precautions Precautions: Fall Restrictions Weight Bearing Restrictions: No      Mobility  Bed Mobility Overal bed mobility: Needs Assistance Bed Mobility: Supine to Sit     Supine to sit: Max assist     General bed mobility comments: Pateint unable to initiate or complete movement. Required max A at trunk and LEs to come supine to sit at edge of bed. Required min a to prevent falling backwards and to the right    Transfers Overall transfer level: Needs assistance Equipment used: None Transfers: Sit  to/from Stand Sit to Stand: Mod assist;Max assist;Min assist         General transfer comment: Pateint required max A with knees blocked to stand up at edge of bed first attempt. Continues with backwards lean but then able to keep knees and hips extended, unable to balance. Completed sit <> stand at edge of bed x 2 (second transfer mod A) and  then sit <> stand bed to chair with mod A. Completed final sit <> stand with min A at chair. Patient remains confused throughout and cannot follow commands reliably.  Ambulation/Gait             General Gait Details: Pateint confused and only took a few steps between bed and chair when PT shifted weight for him. Did not appear to be aware enough for safet ambulation.  Stairs            Wheelchair Mobility    Modified Rankin (Stroke Patients Only)       Balance Overall balance assessment: Needs assistance Sitting-balance support: Feet supported;Feet unsupported;No upper extremity supported Sitting balance-Leahy Scale: Poor Sitting balance - Comments: patient leans right and backwards in sitting requiring min A to keep him upright. Does not use UEs to prevent falling. Postural control: Right lateral lean;Posterior lean Standing balance support: No upper extremity supported Standing balance-Leahy Scale: Poor Standing balance comment: Patient requires support to stand due to backwards lean.                             Pertinent Vitals/Pain Pain Assessment: Faces Pain Score: 0-No pain    Home Living Family/patient expects to be discharged to:: Skilled nursing facility Living Arrangements: Spouse/significant other (lives with wife who is primary caregiver) Available Help at Discharge: Available 24 hours/day   Home Access: Stairs to enter Entrance Stairs-Rails: Can reach both;Left;Right Entrance Stairs-Number of Steps: 5 Home Layout: Able to live on main level with bedroom/bathroom Home Equipment: Walker - 2 wheels;Cane - single point;Shower seat      Prior Function Level of Independence: Needs assistance   Gait / Transfers Assistance Needed: Wife reports that patient was most recently ambulating with her assistance in the hallways at the SNF with no AD. States he needed some light assistance to get in and out of bed. Prior to his hospitalization a the start  of december 2021, patient ambulated in the home and in the community with supervision but no AD.  ADL's / Homemaking Assistance Needed: prior to hospitalization in the beginning of Dec, patient needed help with ADLs and IADLs such as bathing and dressing, driving, meal prep, etc. He was in an adult daycare program at [facility] 3x a week.  Comments: Pateint was most recently completing short term rehab at a SNF after suffering a subdural hematoma at the start of December. Prior to that he had dementia.     Hand Dominance        Extremity/Trunk Assessment   Upper Extremity Assessment Upper Extremity Assessment: Overall WFL for tasks assessed    Lower Extremity Assessment Lower Extremity Assessment: Overall WFL for tasks assessed (patient keeps legs stiff and has difficulty following commands to bend knees.)    Cervical / Trunk Assessment Cervical / Trunk Assessment: Normal  Communication   Communication: Other (comment) (dysphsia and dysarthria,)  Cognition     Overall Cognitive Status: Impaired/Different from baseline Area of Impairment: Following commands;Awareness  General Comments: Patient keeps eyes closed most of the session and has difficulty following commands. He has mitts on due to being agetated recently and nursing states she recently gave him a little bit of a sedative to help calm him. Wife reports he is usually much more alert and engaged but has dementia at baseline.      General Comments General comments (skin integrity, edema, etc.): Pateint wearing mitts    Exercises Other Exercises Other Exercises: Practicied bed mobiltiy, transfers, and a few steps. Educated wife about purpose of PT and discharge reccomendations.   Assessment/Plan    PT Assessment Patient needs continued PT services  PT Problem List Decreased strength;Decreased coordination;Decreased cognition;Decreased activity  tolerance;Decreased knowledge of use of DME;Decreased balance;Decreased safety awareness;Decreased mobility;Decreased knowledge of precautions;Decreased skin integrity;Decreased range of motion       PT Treatment Interventions DME instruction;Balance training;Gait training;Neuromuscular re-education;Stair training;Cognitive remediation;Functional mobility training;Patient/family education;Therapeutic activities;Therapeutic exercise    PT Goals (Current goals can be found in the Care Plan section)  Acute Rehab PT Goals Patient Stated Goal: find out what is wrong and get better PT Goal Formulation: With family Time For Goal Achievement: 06/18/20 Potential to Achieve Goals: Good    Frequency Min 2X/week   Barriers to discharge Decreased caregiver support;Inaccessible home environment Pateint requires more physical assistance than wife can provide, patient is not ambulating or able to navigate steps safely at this time.    Co-evaluation               AM-PAC PT "6 Clicks" Mobility  Outcome Measure Help needed turning from your back to your side while in a flat bed without using bedrails?: A Lot Help needed moving from lying on your back to sitting on the side of a flat bed without using bedrails?: A Lot Help needed moving to and from a bed to a chair (including a wheelchair)?: A Lot Help needed standing up from a chair using your arms (e.g., wheelchair or bedside chair)?: A Lot Help needed to walk in hospital room?: Total Help needed climbing 3-5 steps with a railing? : Total 6 Click Score: 10    End of Session Equipment Utilized During Treatment: Gait belt (patient wearing mitts on both hands) Activity Tolerance: Patient tolerated treatment well;Patient limited by lethargy Patient left: in chair;with chair alarm set;with family/visitor present;with call bell/phone within reach Nurse Communication: Mobility status PT Visit Diagnosis: Unsteadiness on feet (R26.81);Difficulty in  walking, not elsewhere classified (R26.2);History of falling (Z91.81);Other symptoms and signs involving the nervous system (R29.898)    Time: 1120-1150 PT Time Calculation (min) (ACUTE ONLY): 30 min   Charges:   PT Evaluation $PT Eval Moderate Complexity: 1 Mod PT Treatments $Therapeutic Activity: 8-22 mins       Luretha Murphy. Ilsa Iha, PT, DPT 06/04/20, 12:11 PM

## 2020-06-04 NOTE — Progress Notes (Addendum)
PROGRESS NOTE    Alex [name]   BUL:[id number]  DOB: 1932/11/29  PCP: Danella Penton, MD    DOA: 06/03/2020 LOS: 0   Brief Narrative   From H&P by Dr. Para March: "Alex [name] is a 86 y.o. male with medical history significant for HTN, CAD status post CABG, dementia, hypothyroidism, who suffered an epidural hematoma in November 2021, and then a nontraumatic subdural hematoma on 12/6 transferred to [facility] where he underwent bur holes and hematoma evacuation, with residual dysphagia and dysarthria, discharged to SNF on 12/15 who was in his usual state of health until the night of arrival at the nursing home when he slumped over in his chair and was minimally responsive and hypotensive.  The nursing home called as a possible cardiac arrest however by arrival of EMS, patient was already coming around though lethargic.  Most of the history taken from ER report and subsequently by wife who arrived a bit later at the hospital.  She said that she saw her husband earlier in the day and she thinks he is back to his baseline but she is concerned that his speech seemed a little more slurred than his most recent baseline and a previous left facial droop appeared more prominent.  She also voiced concern that during her visit earlier in the day, her husband use the bathroom at least 4 times during her visit which is unusual. ED Course: By arrival in the ED, patient was awake, alert and back to baseline.  He denied chest pain or shortness of breath, cough fever or chills.  Lightheadedness, headache weakness in face arm or leg.  Vitals, temperature 97.4, BP 105/66, pulse 84 O2 sat 95% on room air.  Blood work significant for hemoglobin 11.1, creatinine 1.32, troponin 83. EKG as reviewed by me : Normal sinus rhythm at 93 with right bundle branch block Imaging: CT head and C-spine showed postsurgical changes from previous evacuation of right subdural hematoma.... C-spine no evidence of acute trauma The emergency room  provider spoke with neurosurgeon, Dr. Adriana Simas, who compared CT head images with images from discharge at [facility] and so no new hemorrhage and no recurrent severe mass-effect.  Recommended plan follow-up as outpatient"     Assessment & Plan   Principal Problem:   Syncope Active Problems:   CAD (coronary artery disease)   History of stroke   Essential hypertension   Acquired hypothyroidism   Dementia (HCC)   History of subdural hematoma 05/16/20    H/O Epidural hematoma 04/2020 (HCC)    Hx of CABG   Elevated troponin   Hypotension   AKI (acute kidney injury) (HCC)   Syncope - most likely due to transient hypotension. History of subdural hematoma 05/16/20 s/p evacuation with residual dysphagia and dysarthria. H/O Epidural hematoma 04/2020 Presented from SNF after a syncopal episode in the setting of recent nontraumatic subdural hematoma requiring bur holes and hematoma evacuation.   Of note - patient was hypotensive at SNF and here at time of admission.   CT head reviewed by neurosurgeon, Dr. Adriana Simas.  Please see note for details but no new hemorrhage was seen and no recurrent mass-effect and outpatient follow-up recommended --Neuro checks --Fall and aspiration precautions -Telemetry  -Low suspicion for stroke MRI was ordered on admission due to concerns for worsening of slurred speech --Orthostatic vitals   Hypotension POA with BP 88/70, and was reported to have SBP in 50s at SNF.   Possibly medication related in combination with dehydration.  BP  improved with IV fluid bolus. History of essential hypertension --home irbesartan held.  Need to resume with caution and monitor BP closely. --maintain MAP>65 with IV fluids if needed 12/25 labile BP's with recurrent hypotension this afternoon. Continue holding BP meds.   AKI - present on admission with creatinine 1.32, up from 0.9 at [facility] on 05/20/20.  Resolved. Most likely prerenal related to decreased oral intake from dysphagia resulting  from recent subdural hematoma evacuation, also likely having intermittent hypotension given the presentation. Off IV fluids.   Elevated troponin - very mild and trend flat. No chest pain.  Due to demand ischemia from hypotension. Hx of CAD s/p CABG - stable From H&P: "While at [facility] from 12/6-12/14, complained of chest pain on 2 occasions and according to d/csummary cardiac work-up was performed both times and it was normal.  He was believed to have stable angina."  -Followed by Dr. Mariah Milling   Dysphagia and Dysarthria - recent baseline after subdural hematoma evacuation, per wife at bedside. -No new neurologic deficits but wife feels prominent slurred speech and left facial droop -Swallowed with RN okay, start pureed diet --SLP to evaluate swallow tomorrow Per wife, pt has been on "chopped" diet at SNF and tolerates   History of CVA 2016 without residual deficits -Currently off antiplatelets since it subdural hematoma and has not been restarted -wife had concern on admission about worsening slurring of his speech and left facial droop Follow up pending MRI.   Acquired hypothyroidism continue Synthroid.  Check TSH.   Dementia - with some behavioral disturbances likely related to hospital delirium.    Has hx of episodes of delirium per review of d/c summary from [facility]  -Continue home meds       DVT prophylaxis: SCDs Start: 06/03/20 2238   Diet:  Diet Orders (From admission, onward)    Start     Ordered   06/04/20 1013  DIET - DYS 1 Room service appropriate? Yes; Fluid consistency: Thin  Diet effective now       Question Answer Comment  Room service appropriate? Yes   Fluid consistency: Thin      06/04/20 1013            Code Status: Full Code    Subjective 06/04/20    Pt seen with wife at bedside.  Pt is lethargic but intermittently awakes and is restless.  He is entirely nonverbal for me and does not engage or make eye contact.     Disposition Plan &  Communication   Status is: Inpatient  Remains inpatient appropriate because:Inpatient level of care appropriate due to severity of illness.  No oral intake since admission.  Adjusting BP meds to avoid hypotension and syncope.   Dispo: The patient is from: SNF              Anticipated d/c is to: SNF              Anticipated d/c date is: 1 day              Patient currently is not medically stable to d/c.   Family Communication: wife at bedside on rounds today   Consults, Procedures, Significant Events   Consultants:   Neurosurgery  Procedures:   None  Antimicrobials:  Anti-infectives (From admission, onward)   None         Objective   Vitals:   06/04/20 0028 06/04/20 0619 06/04/20 0717 06/04/20 1157  BP: (!) 151/93 (!) 150/76 138/81 92/61  Pulse:  73 71 76 78  Resp: 18 16 18 19   Temp: (!) 97.5 F (36.4 C) 97.8 F (36.6 C) (!) 97.5 F (36.4 C) 98.1 F (36.7 C)  TempSrc: Oral  Axillary Oral  SpO2: 99% 99% 97% 98%  Weight: 68.5 kg     Height:        Intake/Output Summary (Last 24 hours) at 06/04/2020 1326 Last data filed at 06/04/2020 1121 Gross per 24 hour  Intake 1237 ml  Output 650 ml  Net 587 ml   Filed Weights   06/03/20 2103 06/04/20 0028  Weight: 74.7 kg 68.5 kg    Physical Exam:  General exam: lethargic and intermittently restless, no acute distress Respiratory system: CTAB, no wheezes, rales or rhonchi, normal respiratory effort. Cardiovascular system: normal S1/S2, RRR, no pedal edema.   Gastrointestinal system: soft, NT, ND, +bowel sounds. Central nervous system: unable to evaluate due to patient lethargic and won't follow any commands  Extremities: moves all, no edema, normal tone Psychiatry: restless, mildly agitated  Labs   Data Reviewed: I have personally reviewed following labs and imaging studies  CBC: Recent Labs  Lab 06/03/20 2059  WBC 4.8  NEUTROABS 3.3  HGB 11.1*  HCT 33.5*  MCV 89.8  PLT 120*   Basic Metabolic  Panel: Recent Labs  Lab 06/03/20 2059 06/04/20 0453  NA 136 141  K 3.7 3.6  CL 100 107  CO2 24 28  GLUCOSE 125* 102*  BUN 26* 20  CREATININE 1.32* 0.98  CALCIUM 9.2 8.6*   GFR: Estimated Creatinine Clearance: 54.4 mL/min (by C-G formula based on SCr of 0.98 mg/dL). Liver Function Tests: No results for input(s): AST, ALT, ALKPHOS, BILITOT, PROT, ALBUMIN in the last 168 hours. No results for input(s): LIPASE, AMYLASE in the last 168 hours. No results for input(s): AMMONIA in the last 168 hours. Coagulation Profile: No results for input(s): INR, PROTIME in the last 168 hours. Cardiac Enzymes: No results for input(s): CKTOTAL, CKMB, CKMBINDEX, TROPONINI in the last 168 hours. BNP (last 3 results) No results for input(s): PROBNP in the last 8760 hours. HbA1C: No results for input(s): HGBA1C in the last 72 hours. CBG: No results for input(s): GLUCAP in the last 168 hours. Lipid Profile: Recent Labs    06/04/20 0453  CHOL 134  HDL 41  LDLCALC 75  TRIG 88  CHOLHDL 3.3   Thyroid Function Tests: No results for input(s): TSH, T4TOTAL, FREET4, T3FREE, THYROIDAB in the last 72 hours. Anemia Panel: No results for input(s): VITAMINB12, FOLATE, FERRITIN, TIBC, IRON, RETICCTPCT in the last 72 hours. Sepsis Labs: No results for input(s): PROCALCITON, LATICACIDVEN in the last 168 hours.  Recent Results (from the past 240 hour(s))  Resp Panel by RT-PCR (Flu A&B, Covid) Nasopharyngeal Swab     Status: None   Collection Time: 06/03/20  8:59 PM   Specimen: Nasopharyngeal Swab; Nasopharyngeal(NP) swabs in vial transport medium  Result Value Ref Range Status   SARS Coronavirus 2 by RT PCR NEGATIVE NEGATIVE Final    Comment: (NOTE) SARS-CoV-2 target nucleic acids are NOT DETECTED.  The SARS-CoV-2 RNA is generally detectable in upper respiratory specimens during the acute phase of infection. The lowest concentration of SARS-CoV-2 viral copies this assay can detect is 138 copies/mL. A  negative result does not preclude SARS-Cov-2 infection and should not be used as the sole basis for treatment or other patient management decisions. A negative result may occur with  improper specimen collection/handling, submission of specimen other than nasopharyngeal swab, presence  of viral mutation(s) within the areas targeted by this assay, and inadequate number of viral copies(<138 copies/mL). A negative result must be combined with clinical observations, patient history, and epidemiological information. The expected result is Negative.  Fact Sheet for Patients:  BloggerCourse.com  Fact Sheet for Healthcare Providers:  SeriousBroker.it  This test is no t yet approved or cleared by the [place] FDA and  has been authorized for detection and/or diagnosis of SARS-CoV-2 by FDA under an Emergency Use Authorization (EUA). This EUA will remain  in effect (meaning this test can be used) for the duration of the COVID-19 declaration under Section 564(b)(1) of the Act, 21 U.S.C.section 360bbb-3(b)(1), unless the authorization is terminated  or revoked sooner.       Influenza A by PCR NEGATIVE NEGATIVE Final   Influenza B by PCR NEGATIVE NEGATIVE Final    Comment: (NOTE) The Xpert Xpress SARS-CoV-2/FLU/RSV plus assay is intended as an aid in the diagnosis of influenza from Nasopharyngeal swab specimens and should not be used as a sole basis for treatment. Nasal washings and aspirates are unacceptable for Xpert Xpress SARS-CoV-2/FLU/RSV testing.  Fact Sheet for Patients: BloggerCourse.com  Fact Sheet for Healthcare Providers: SeriousBroker.it  This test is not yet approved or cleared by the [place] FDA and has been authorized for detection and/or diagnosis of SARS-CoV-2 by FDA under an Emergency Use Authorization (EUA). This EUA will remain in effect (meaning this test can  be used) for the duration of the COVID-19 declaration under Section 564(b)(1) of the Act, 21 U.S.C. section 360bbb-3(b)(1), unless the authorization is terminated or revoked.  Performed at [facility], [address] [place], [place] [address]       Imaging Studies   CT Head Wo Contrast  Result Date: 06/03/2020 CLINICAL DATA:  Possible syncopal episode, found down EXAM: CT HEAD WITHOUT CONTRAST CT CERVICAL SPINE WITHOUT CONTRAST TECHNIQUE: Multidetector CT imaging of the head and cervical spine was performed following the standard protocol without intravenous contrast. Multiplanar CT image reconstructions of the cervical spine were also generated. COMPARISON:  CT head 05/16/2020, CT head and cervical spine 04/20/2020 FINDINGS: CT HEAD FINDINGS Brain: Patient appears to have undergone prior evacuation of the previously seen right subdural collection with placement small frontal and parietal burr holes. There is some regions of thin subjacent thickening and heterogeneity, likely dural thickening adjacent the evacuation sites however, there are some persistent lucencies of gas layering anti dependently within a residual collection which measures up to 8 mm in maximal thickness. Some more dependently layering intermediate attenuation is seen within this collection as well which could reflect some mixed age blood products. Some persistent leftward mediastinal shift of approximately 3 mm. No evidence of acute infarction, hydrocephalus, or new extra-axial collection. Symmetric prominence of the ventricles, cisterns and sulci compatible with parenchymal volume loss. Patchy areas of white matter hypoattenuation are most compatible with chronic microvascular angiopathy. Vascular: Atherosclerotic calcification of the carotid siphons and intradural vertebral arteries. No hyperdense vessel. Skull: Postsurgical changes from burr hole placement in the right frontal and parietal bones with subjacent  dural thickening. Mild overlying scalp thickening is likely postsurgical as well. No other acute or worrisome calvarial abnormalities. Sinuses/Orbits: Diffuse mural thickening in ethmoids with some pneumatized secretions in the right sphenoid sinus. Mastoids and middle ear cavities are clear. Included orbital structures are unremarkable. Other: None CT CERVICAL SPINE FINDINGS Alignment: Chronic straightening and slight reversal the normal cervical lordosis. No evidence of traumatic listhesis. No abnormally widened, perched or  jumped facets. Normal alignment of the craniocervical and atlantoaxial articulations. Skull base and vertebrae: No acute skull base fracture. No vertebral body fracture or height loss. Normal bone mineralization. No worrisome osseous lesions. Moderate atlantodental and basion dens arthrosis with early calcific pannus formation. Additional cervical spondylitic changes as below. Soft tissues and spinal canal: No pre or paravertebral fluid or swelling. No visible canal hematoma. Disc levels: Multilevel intervertebral disc height loss with spondylitic endplate changes. Features are most pronounced C5-6 with an exuberant, calcified central posterior disc osteophyte complex. Resulting in some moderate canal stenosis. Additional smaller disc osteophyte complexes are present C3-4, C4-5 and C6-7 resulting only partial effacement of the ventral thecal sac. Disc height loss however is most pronounced at the C6-7 level with more exuberant anterior osteophytosis. Multilevel uncinate spurring facet hypertrophic changes in the cervical spine, maximal C6-7 resulting in at most moderate foraminal narrowing. Upper chest: No acute abnormality in the upper chest or imaged lung apices. Other: Cervical carotid atherosclerosis.  Normal thyroid. IMPRESSION: 1. Postsurgical changes from evacuation of the previously seen right subdural collection with placement small frontal and parietal burr holes. Dural thickening and  heterogeneity is seen adjacent the evacuation sites, nonspecific. Persistent pneumo cephaly layering antidependently and some mild heterogeneous material reflecting mixed age blood products is seen layering dependently is somewhat conspicuous given the time since operative intervention. Minimal residual leftward midline shift of 3 mm. 2. No evidence of acute fracture or traumatic listhesis of the cervical spine. 3. Multilevel cervical spondylitic changes, most pronounced at C5-6 with moderate canal stenosis, and at C6-7 where there is moderate bilateral foraminal narrowing. 4. Cervical and intracranial atherosclerosis. These results were called by telephone at the time of interpretation on 06/03/2020 at 9:50 pm to provider [facility] , who verbally acknowledged these results. Electronically Signed   By: Kreg Shropshire M.D.   On: 06/03/2020 21:50   CT Cervical Spine Wo Contrast  Result Date: 06/03/2020 CLINICAL DATA:  Possible syncopal episode, found down EXAM: CT HEAD WITHOUT CONTRAST CT CERVICAL SPINE WITHOUT CONTRAST TECHNIQUE: Multidetector CT imaging of the head and cervical spine was performed following the standard protocol without intravenous contrast. Multiplanar CT image reconstructions of the cervical spine were also generated. COMPARISON:  CT head 05/16/2020, CT head and cervical spine 04/20/2020 FINDINGS: CT HEAD FINDINGS Brain: Patient appears to have undergone prior evacuation of the previously seen right subdural collection with placement small frontal and parietal burr holes. There is some regions of thin subjacent thickening and heterogeneity, likely dural thickening adjacent the evacuation sites however, there are some persistent lucencies of gas layering anti dependently within a residual collection which measures up to 8 mm in maximal thickness. Some more dependently layering intermediate attenuation is seen within this collection as well which could reflect some mixed age blood products.  Some persistent leftward mediastinal shift of approximately 3 mm. No evidence of acute infarction, hydrocephalus, or new extra-axial collection. Symmetric prominence of the ventricles, cisterns and sulci compatible with parenchymal volume loss. Patchy areas of white matter hypoattenuation are most compatible with chronic microvascular angiopathy. Vascular: Atherosclerotic calcification of the carotid siphons and intradural vertebral arteries. No hyperdense vessel. Skull: Postsurgical changes from burr hole placement in the right frontal and parietal bones with subjacent dural thickening. Mild overlying scalp thickening is likely postsurgical as well. No other acute or worrisome calvarial abnormalities. Sinuses/Orbits: Diffuse mural thickening in ethmoids with some pneumatized secretions in the right sphenoid sinus. Mastoids and middle ear cavities are clear. Included orbital structures  are unremarkable. Other: None CT CERVICAL SPINE FINDINGS Alignment: Chronic straightening and slight reversal the normal cervical lordosis. No evidence of traumatic listhesis. No abnormally widened, perched or jumped facets. Normal alignment of the craniocervical and atlantoaxial articulations. Skull base and vertebrae: No acute skull base fracture. No vertebral body fracture or height loss. Normal bone mineralization. No worrisome osseous lesions. Moderate atlantodental and basion dens arthrosis with early calcific pannus formation. Additional cervical spondylitic changes as below. Soft tissues and spinal canal: No pre or paravertebral fluid or swelling. No visible canal hematoma. Disc levels: Multilevel intervertebral disc height loss with spondylitic endplate changes. Features are most pronounced C5-6 with an exuberant, calcified central posterior disc osteophyte complex. Resulting in some moderate canal stenosis. Additional smaller disc osteophyte complexes are present C3-4, C4-5 and C6-7 resulting only partial effacement of the  ventral thecal sac. Disc height loss however is most pronounced at the C6-7 level with more exuberant anterior osteophytosis. Multilevel uncinate spurring facet hypertrophic changes in the cervical spine, maximal C6-7 resulting in at most moderate foraminal narrowing. Upper chest: No acute abnormality in the upper chest or imaged lung apices. Other: Cervical carotid atherosclerosis.  Normal thyroid. IMPRESSION: 1. Postsurgical changes from evacuation of the previously seen right subdural collection with placement small frontal and parietal burr holes. Dural thickening and heterogeneity is seen adjacent the evacuation sites, nonspecific. Persistent pneumo cephaly layering antidependently and some mild heterogeneous material reflecting mixed age blood products is seen layering dependently is somewhat conspicuous given the time since operative intervention. Minimal residual leftward midline shift of 3 mm. 2. No evidence of acute fracture or traumatic listhesis of the cervical spine. 3. Multilevel cervical spondylitic changes, most pronounced at C5-6 with moderate canal stenosis, and at C6-7 where there is moderate bilateral foraminal narrowing. 4. Cervical and intracranial atherosclerosis. These results were called by telephone at the time of interpretation on 06/03/2020 at 9:50 pm to provider [facility] , who verbally acknowledged these results. Electronically Signed   By: Kreg Shropshire M.D.   On: 06/03/2020 21:50     Medications   Scheduled Meds: .  stroke: mapping our early stages of recovery book   Does not apply Once  . ezetimibe  10 mg Oral Daily  . galantamine  12 mg Oral BID PC  . levothyroxine  75 mcg Oral Daily  . memantine  21 mg Oral Daily  . multivitamin with minerals  1 tablet Oral Daily  . nitroGLYCERIN  0.4 mg Sublingual UD  . pantoprazole  40 mg Oral Daily  . senna-docusate  2 tablet Oral BID  . sertraline  50 mg Oral Daily  . simvastatin  40 mg Oral q1800  . sodium chloride flush   3 mL Intravenous Q12H   Continuous Infusions:     LOS: 0 days    Time spent: 30 minutes with >50% spent at bedside and in coordination of care.    Pennie Banter, DO [facility]  06/04/2020, 1:26 PM    If 7PM-7AM, please contact night-coverage. How to contact the [facility] Attending or Consulting provider 7A - 7P or covering provider during after hours 7P -7A, for this patient?    1. Check the care team in [facility] and look for a) attending/consulting [facility] provider listed and b) the [facility] team listed 2. Log into [email or web address] and use [place]'s universal password to access. If you do not have the password, please contact the hospital operator. 3. Locate the [facility] provider you are looking for under [facility] and page to a number that you can be directly reached. 4. If you still have difficulty reaching the provider, please page the [facility] (Director on Call) for the Hospitalists listed on amion for assistance.

## 2020-06-05 LAB — TSH: TSH: 2.425 u[IU]/mL (ref 0.350–4.500)

## 2020-06-05 LAB — GLUCOSE, CAPILLARY: Glucose-Capillary: 82 mg/dL (ref 70–99)

## 2020-06-05 MED ORDER — HYDRALAZINE HCL 25 MG PO TABS: 25.0000 mg | ORAL_TABLET | Freq: Two times a day (BID) | ORAL | 0 refills | Status: DC | PRN

## 2020-06-05 MED ORDER — ACETAMINOPHEN 325 MG PO TABS: 650.0000 mg | ORAL_TABLET | Freq: Three times a day (TID) | ORAL | 0 refills | Status: AC

## 2020-06-05 MED ORDER — B-12 1000 MCG PO TBCR: 1000.0000 ug | EXTENDED_RELEASE_TABLET | Freq: Every day | ORAL | Status: DC

## 2020-06-05 NOTE — Progress Notes (Signed)
The patient has been discharged via EMS. IV removed. Telemetry order d/c. No falls. Report given to [facility]. Wife at bedside. Vital signs stable at discharge.

## 2020-06-05 NOTE — Evaluation (Signed)
Occupational Therapy Evaluation Patient Details Name: Alex [name] MRN: [id number] DOB: 1932/11/03 Today's Date: 06/05/2020    History of Present Illness Alex [name] is a 86 y.o. male with medical history significant for HTN, CAD status post CABG, dementia, hypothyroidism, who suffered an epidural hematoma in November 2021, and then a nontraumatic subdural hematoma on 12/6 transferred to [facility] where he underwent bur holes and hematoma evacuation, with residual dysphagia and dysarthria, discharged to SNF on 12/15 who was in his usual state of health until the night of arrival at the nursing home when he slumped over in his chair and was minimally responsive and hypotensive.  The nursing home called as a possible cardiac arrest however by arrival of EMS, patient was already coming around though lethargic. By arrival in the ED, patient was awake, alert and back to baseline. CT head and C-spine showed postsurgical changes from previous evacuation of right subdural hematoma. C-spine no evidence of acute trauma  The emergency room provider spoke with neurosurgeon, Dr. Adriana Simas, who compared CT head images with images from discharge at [facility] and so no new hemorrhage and no recurrent severe mass-effect.  Recommended plan follow-up as outpatient. Patient became hypotensive following admission so this might be etiology fo syncope.   Clinical Impression   Pt seen this date for OT evaluation, patient presents with muscle weakness, decreased transfers, functional mobility, decreased ability to perform self care and ADL tasks.  Pt with dementia, able to follow one step commands consistently today.  Min assist for transfers/mobility, decreased balance.  Patient had been in SNF for rehab prior to this admission and would be appropriate for returning to continue therapy prior to returning home with wife.  Patient will benefit from skilled OT services to maximize safety and independence and to reduce caregiver burden.       Follow Up Recommendations  SNF    Equipment Recommendations       Recommendations for Other Services       Precautions / Restrictions Precautions Precautions: Fall Restrictions Weight Bearing Restrictions: No      Mobility Bed Mobility Overal bed mobility: Needs Assistance Bed Mobility: Supine to Sit     Supine to sit: Min assist          Transfers Overall transfer level: Needs assistance Equipment used: None Transfers: Sit to/from Stand Sit to Stand: Min assist         General transfer comment: Min assist to min guard for transfers this date, verbal cues for safety    Balance Overall balance assessment: Mild deficits observed, not formally tested Sitting-balance support: Feet supported;Bilateral upper extremity supported Sitting balance-Leahy Scale: Fair     Standing balance support: No upper extremity supported Standing balance-Leahy Scale: Fair                             ADL either performed or assessed with clinical judgement   ADL Overall ADL's : Needs assistance/impaired Eating/Feeding: Set up;Minimal assistance   Grooming: Set up;Standing;Cueing for safety;Wash/dry hands;Wash/dry face   Upper Body Bathing: Minimal assistance   Lower Body Bathing: Minimal assistance   Upper Body Dressing : Minimal assistance   Lower Body Dressing: Moderate assistance   Toilet Transfer: Minimal assistance   Toileting- Clothing Manipulation and Hygiene: Minimal assistance               Vision Baseline Vision/History: Wears glasses       Perception  Praxis      Pertinent Vitals/Pain Pain Assessment: No/denies pain Pain Score: 0-No pain     Hand Dominance Right   Extremity/Trunk Assessment Upper Extremity Assessment Upper Extremity Assessment: Generalized weakness   Lower Extremity Assessment Lower Extremity Assessment: Defer to PT evaluation   Cervical / Trunk Assessment Cervical / Trunk Assessment: Normal    Communication Communication Communication: No difficulties   Cognition Arousal/Alertness: Awake/alert Behavior During Therapy: WFL for tasks assessed/performed Overall Cognitive Status: Impaired/Different from baseline Area of Impairment: Memory                     Memory: Decreased short-term memory Following Commands: Follows one step commands consistently       General Comments: Lethargic at the beginning of session but easily aroused.  Able to follow one step commands consistently today.  Able to tell therapist it was his wife in the room with him but called her by her sister's name.  Able to name 2/3 of his children.   General Comments   TX:  Pt seen for grooming tasks at the sink in standing with min assist for balance, cues for self care tasks.     Exercises     Shoulder Instructions      Home Living Family/patient expects to be discharged to:: Skilled nursing facility Living Arrangements: Spouse/significant other Available Help at Discharge: Available 24 hours/day   Home Access: Stairs to enter Entrance Stairs-Number of Steps: 5 Entrance Stairs-Rails: Can reach both;Left;Right Home Layout: Able to live on main level with bedroom/bathroom               Home Equipment: Walker - 2 wheels;Cane - single point;Shower seat          Prior Functioning/Environment Level of Independence: Needs assistance  Gait / Transfers Assistance Needed: Wife reports that patient was most recently ambulating with her assistance in the hallways at the SNF with no AD. States he needed some light assistance to get in and out of bed. Prior to his hospitalization a the start of december 2021, patient ambulated in the home and in the community with supervision but no AD. ADL's / Homemaking Assistance Needed: prior to hospitalization in the beginning of Dec, patient needed help with ADLs and IADLs such as bathing and dressing, driving, meal prep, etc. He was in an adult daycare  program at [facility] 3x a week. Communication / Swallowing Assistance Needed: has dysphasia and dysarthria (unclear if prior to december) Comments: Pateint was most recently completing short term rehab at a SNF after suffering a subdural hematoma at the start of December. Prior to that he had dementia.        OT Problem List: Decreased strength;Decreased knowledge of use of DME or AE;Decreased cognition;Decreased activity tolerance;Impaired balance (sitting and/or standing)      OT Treatment/Interventions: Self-care/ADL training;Therapeutic exercise;Patient/family education;Balance training;Therapeutic activities;DME and/or AE instruction;Cognitive remediation/compensation    OT Goals(Current goals can be found in the care plan section) Acute Rehab OT Goals Patient Stated Goal: to be independent OT Goal Formulation: With patient/family Time For Goal Achievement: 06/18/20 Potential to Achieve Goals: Fair ADL Goals Pt Will Perform Lower Body Dressing: (P) with min guard assist Pt Will Transfer to Toilet: (P) with min guard assist  OT Frequency: Min 1X/week   Barriers to D/C:            Co-evaluation              AM-PAC OT "  6 Clicks" Daily Activity     Outcome Measure Help from another person eating meals?: A Little Help from another person taking care of personal grooming?: A Little Help from another person toileting, which includes using toliet, bedpan, or urinal?: A Little Help from another person bathing (including washing, rinsing, drying)?: A Lot Help from another person to put on and taking off regular upper body clothing?: A Little Help from another person to put on and taking off regular lower body clothing?: A Lot 6 Click Score: 16   End of Session Equipment Utilized During Treatment: Gait belt  Activity Tolerance: Patient tolerated treatment well Patient left: in bed;with call bell/phone within reach;with bed alarm set  OT Visit Diagnosis:  Unsteadiness on feet (R26.81);Muscle weakness (generalized) (M62.81)                Time: 1353-1420 OT Time Calculation (min): 27 min Charges:  OT General Charges $OT Visit: 1 Visit OT Evaluation $OT Eval Low Complexity: 1 Low OT Treatments $Self Care/Home Management : 8-22 mins  Quintin Hjort T Casilda Pickerill, OTR/L, CLT   Kathlyne Loud 06/05/2020, 3:17 PM

## 2020-06-05 NOTE — Discharge Summary (Signed)
Physician Discharge Summary  Alex [name] VHQ:[id number] DOB: 1932-12-10 DOA: 06/03/2020  PCP: Alex Penton, MD  Admit date: 06/03/2020 Discharge date: 06/05/2020  Admitted From: SNF Disposition:  SNF  Recommendations for Outpatient Follow-up:  1. Follow up with PCP in 1-2 weeks 2. Please obtain BMP/CBC in one week 3. Please follow up with neurology as previously scheduled 4. Please follow up on patient's blood pressure.  He came in after syncopal episode that was due to severe hypotension, and he had been taking his prescribed irbesartan.  This was stopped and BP's were normal to minimally elevated, but still with episodes of systolic BP in 90's.     Home Health: No  Equipment/Devices: No   Discharge Condition: Stable  CODE STATUS: Full  Diet recommendation: Dysphagia 3   Per SLP - Diet recommended at D/C:  Recommend a Mech Soft diet w/ well-Cut meats, moistened foods for ease of effort and attention eating; Thin liquids w/ monitoring cup vs straw - pt uses a straw d/t shaky UEs. Recommend general aspiration precautions, Pills in Puree Crushed vs Whole for safer, easier swallowing d/t Dementia. Support w/ eating at meals -- less distractions at meals.    Discharge Diagnoses: Principal Problem:   Syncope Active Problems:   CAD (coronary artery disease)   History of stroke   Essential hypertension   Acquired hypothyroidism   Dementia (HCC)   History of subdural hematoma 05/16/20    H/O Epidural hematoma 04/2020 (HCC)    Hx of CABG   Elevated troponin   Hypotension   AKI (acute kidney injury) (HCC)    Summary of HPI and Hospital Course:  From H&P by Dr. Para March: "Alex [name] is a 86 y.o. male with medical history significant for HTN, CAD status post CABG, dementia, hypothyroidism, who suffered an epidural hematoma in November 2021, and then a nontraumatic subdural hematoma on 12/6 transferred to [facility] where he underwent bur holes and hematoma evacuation, with residual  dysphagia and dysarthria, discharged to SNF on 12/15 who was in his usual state of health until the night of arrival at the nursing home when he slumped over in his chair and was minimally responsive and hypotensive.  The nursing home called as a possible cardiac arrest however by arrival of EMS, patient was already coming around though lethargic.  Most of the history taken from ER report and subsequently by wife who arrived a bit later at the hospital.  She said that she saw her husband earlier in the day and she thinks he is back to his baseline but she is concerned that his speech seemed a little more slurred than his most recent baseline and a previous left facial droop appeared more prominent.  She also voiced concern that during her visit earlier in the day, her husband use the bathroom at least 4 times during her visit which is unusual. ED Course: By arrival in the ED, patient was awake, alert and back to baseline.  He denied chest pain or shortness of breath, cough fever or chills.  Lightheadedness, headache weakness in face arm or leg.  Vitals, temperature 97.4, BP 105/66, pulse 84 O2 sat 95% on room air.  Blood work significant for hemoglobin 11.1, creatinine 1.32, troponin 83. EKG as reviewed by me : Normal sinus rhythm at 93 with right bundle branch block Imaging: CT head and C-spine showed postsurgical changes from previous evacuation of right subdural hematoma.... C-spine no evidence of acute trauma The emergency room provider spoke with neurosurgeon,  Dr. Adriana Simas, who compared CT head images with images from discharge at [facility] and so no new hemorrhage and no recurrent severe mass-effect.  Recommended plan follow-up as outpatient"      Syncope - most likely due to transient hypotension.   Patient was hypotensive at SNF with report of SBP in 50's and here at time of admission with SBP 80's.   Irbesartan has been stopped to prevent hypotension. As needed hydralazine may be used for systolic BP >  160 or diastolic BP >90. Echo was unchanged from prior in July, EF 50-55%, mild diastolic dysfunction.   History of subdural hematoma 05/16/20 s/p evacuation with residual dysphagia and dysarthria. H/O Epidural hematoma 04/2020 Presented from SNF after a syncopal episode in the setting of recent nontraumatic subdural hematoma requiring bur holes and hematoma evacuation.   CT head reviewed by neurosurgeon, Dr. Adriana Simas.Please see note for details but no new hemorrhage was seen and no recurrent mass-effect and outpatient follow-up recommended.    Hypotension POA with BP 88/70, and was reported to have SBP in 50s at SNF.   Possibly medication relatedin combination with dehydration.  BP improved with IV fluid bolus. With antihypertensive held during admission, pt still have occasional low BP's.   History of essential hypertension --stopped irbesartan --start on hydralazine as needed per orders   AKI - present on admission with creatinine 1.32, up from 0.9 at [facility] on 05/20/20.  Resolved with IV hydration and resolution of hypotension.     Elevated troponin - very mild and trend flat. No chest pain.  Due to demand ischemia from hypotension.   Hx of CAD s/p CABG - stable From H&P: "While at [facility] from 12/6-12/14, complained of chest pain on 2 occasions and according to d/csummary cardiac work-up was performed both times and it was normal. He was believed to have stable angina."  -Followed by Dr. Mariah Milling   Dysphagiaand Dysarthria - recent baseline after subdural hematoma evacuation, per wife at bedside. -Nonewneurologic deficits but wife feels prominent slurred speech and left facial droop -Swallowed with RN okay, start pureed diet --SLP to evaluate swallow tomorrow Per wife, pt has been on "chopped" diet at SNF and tolerates   History of CVA2016 without residual deficits -Currently off antiplatelets since it subdural hematoma and has not been restarted -wife had concern on  admission about worsening slurring of his speech and left facial droop Follow up pending MRI.   Acquired hypothyroidism continue Synthroid.  Check TSH.   Dementia - with some behavioral disturbances likely related to hospital delirium.    Has hx of episodes of delirium per review of d/c summary from [facility]  -Continue home meds including as needed Seroquel    Discharge Instructions   Discharge Instructions    Call MD for:  extreme fatigue   Complete by: As directed    Call MD for:  persistant dizziness or light-headedness   Complete by: As directed    Call MD for:  persistant nausea and vomiting   Complete by: As directed    Call MD for:  severe uncontrolled pain   Complete by: As directed    Call MD for:  temperature >100.4   Complete by: As directed    Discharge instructions   Complete by: As directed    We have stopped your blood pressure medication, irbesartan. The episode of passing out was due to blood pressure dropping too low. While we had irbesartan held in the hospital, blood pressures were normal or very mildly elevated,  but there were episodes of blood pressures on the low side.    I have started a different blood pressure medication called hydralazine, to be used as needed when blood pressures are significantly elevated.   Increase activity slowly   Complete by: As directed      Allergies as of 06/05/2020      Reactions   Aspirin Other (See Comments)   "unknown"   Aspirin-dipyridamole Er Other (See Comments)   "unknown"   Aspirin-dipyridamole Er    aggrenox   Ciprofloxacin Other (See Comments)   "burning sensation" Other reaction(s): OTHER   Contrast Media [iodinated Diagnostic Agents]    Burning when having mri contrast dye -    Morphine Other (See Comments)   "unknown" Other reaction(s): OTHER   Other Other (See Comments)   Other reaction(s): Unknown Other reaction(s): OTHER Burning when having mri contrast dye -  Other reaction(s): OTHER    Venlafaxine Other (See Comments)   "unknown" Other reaction(s): OTHER  AKA Effexor Other reaction(s): OTHER      Medication List    STOP taking these medications   irbesartan 300 MG tablet Commonly known as: AVAPRO     TAKE these medications   acetaminophen 325 MG tablet Commonly known as: TYLENOL Take 2 tablets (650 mg total) by mouth 3 (three) times daily for 10 days.   B-12 1000 MCG Tbcr Take 1,000 mcg by mouth daily. What changed:   medication strength  how much to take   Centrum Silver tablet Take 1 tablet by mouth daily.   ezetimibe 10 MG tablet Commonly known as: ZETIA Take 1 tablet (10 mg total) by mouth daily.   galantamine 12 MG tablet Commonly known as: RAZADYNE Take 12 mg by mouth 2 (two) times daily.   hydrALAZINE 25 MG tablet Commonly known as: APRESOLINE Take 1 tablet (25 mg total) by mouth 2 (two) times daily as needed (for SBP>160 or DBP>90).   hydrocortisone 2.5 % cream Apply 1 application topically daily as needed.   levothyroxine 75 MCG tablet Commonly known as: SYNTHROID Take 75 mcg by mouth daily.   memantine 21 MG Cp24 24 hr capsule Commonly known as: NAMENDA XR Take 21 mg by mouth daily.   nitroGLYCERIN 0.4 MG SL tablet Commonly known as: NITROSTAT Place 1 tablet (0.4 mg total) under the tongue as directed.   omeprazole 40 MG capsule Commonly known as: PRILOSEC Take 40 mg by mouth daily.   QUEtiapine 25 MG tablet Commonly known as: SEROQUEL Take 25 mg by mouth daily as needed for agitation.   senna-docusate 8.6-50 MG tablet Commonly known as: Senokot-S Take 2 tablets by mouth 2 (two) times daily.   sertraline 50 MG tablet Commonly known as: ZOLOFT Take 50 mg by mouth daily.   simvastatin 40 MG tablet Commonly known as: ZOCOR TAKE ONE TABLET BY MOUTH AT BEDTIME       Allergies  Allergen Reactions  . Aspirin Other (See Comments)    "unknown"  . Aspirin-Dipyridamole Er Other (See Comments)    "unknown"  .  Aspirin-Dipyridamole Er     aggrenox  . Ciprofloxacin Other (See Comments)    "burning sensation" Other reaction(s): OTHER  . Contrast Media [Iodinated Diagnostic Agents]     Burning when having mri contrast dye -   . Morphine Other (See Comments)    "unknown" Other reaction(s): OTHER  . Other Other (See Comments)    Other reaction(s): Unknown  Other reaction(s): OTHER Burning when having mri contrast dye -  Other reaction(s): OTHER   . Venlafaxine Other (See Comments)    "unknown" Other reaction(s): OTHER  AKA Effexor Other reaction(s): OTHER     Consultations:  Neurosurgery    Procedures/Studies: CT Head Wo Contrast  Result Date: 06/03/2020 CLINICAL DATA:  Possible syncopal episode, found down EXAM: CT HEAD WITHOUT CONTRAST CT CERVICAL SPINE WITHOUT CONTRAST TECHNIQUE: Multidetector CT imaging of the head and cervical spine was performed following the standard protocol without intravenous contrast. Multiplanar CT image reconstructions of the cervical spine were also generated. COMPARISON:  CT head 05/16/2020, CT head and cervical spine 04/20/2020 FINDINGS: CT HEAD FINDINGS Brain: Patient appears to have undergone prior evacuation of the previously seen right subdural collection with placement small frontal and parietal burr holes. There is some regions of thin subjacent thickening and heterogeneity, likely dural thickening adjacent the evacuation sites however, there are some persistent lucencies of gas layering anti dependently within a residual collection which measures up to 8 mm in maximal thickness. Some more dependently layering intermediate attenuation is seen within this collection as well which could reflect some mixed age blood products. Some persistent leftward mediastinal shift of approximately 3 mm. No evidence of acute infarction, hydrocephalus, or new extra-axial collection. Symmetric prominence of the ventricles, cisterns and sulci compatible with parenchymal volume  loss. Patchy areas of white matter hypoattenuation are most compatible with chronic microvascular angiopathy. Vascular: Atherosclerotic calcification of the carotid siphons and intradural vertebral arteries. No hyperdense vessel. Skull: Postsurgical changes from burr hole placement in the right frontal and parietal bones with subjacent dural thickening. Mild overlying scalp thickening is likely postsurgical as well. No other acute or worrisome calvarial abnormalities. Sinuses/Orbits: Diffuse mural thickening in ethmoids with some pneumatized secretions in the right sphenoid sinus. Mastoids and middle ear cavities are clear. Included orbital structures are unremarkable. Other: None CT CERVICAL SPINE FINDINGS Alignment: Chronic straightening and slight reversal the normal cervical lordosis. No evidence of traumatic listhesis. No abnormally widened, perched or jumped facets. Normal alignment of the craniocervical and atlantoaxial articulations. Skull base and vertebrae: No acute skull base fracture. No vertebral body fracture or height loss. Normal bone mineralization. No worrisome osseous lesions. Moderate atlantodental and basion dens arthrosis with early calcific pannus formation. Additional cervical spondylitic changes as below. Soft tissues and spinal canal: No pre or paravertebral fluid or swelling. No visible canal hematoma. Disc levels: Multilevel intervertebral disc height loss with spondylitic endplate changes. Features are most pronounced C5-6 with an exuberant, calcified central posterior disc osteophyte complex. Resulting in some moderate canal stenosis. Additional smaller disc osteophyte complexes are present C3-4, C4-5 and C6-7 resulting only partial effacement of the ventral thecal sac. Disc height loss however is most pronounced at the C6-7 level with more exuberant anterior osteophytosis. Multilevel uncinate spurring facet hypertrophic changes in the cervical spine, maximal C6-7 resulting in at most  moderate foraminal narrowing. Upper chest: No acute abnormality in the upper chest or imaged lung apices. Other: Cervical carotid atherosclerosis.  Normal thyroid. IMPRESSION: 1. Postsurgical changes from evacuation of the previously seen right subdural collection with placement small frontal and parietal burr holes. Dural thickening and heterogeneity is seen adjacent the evacuation sites, nonspecific. Persistent pneumo cephaly layering antidependently and some mild heterogeneous material reflecting mixed age blood products is seen layering dependently is somewhat conspicuous given the time since operative intervention. Minimal residual leftward midline shift of 3 mm. 2. No evidence of acute fracture or traumatic listhesis of the cervical spine. 3. Multilevel cervical spondylitic changes, most pronounced at C5-6  with moderate canal stenosis, and at C6-7 where there is moderate bilateral foraminal narrowing. 4. Cervical and intracranial atherosclerosis. These results were called by telephone at the time of interpretation on 06/03/2020 at 9:50 pm to provider [facility] , who verbally acknowledged these results. Electronically Signed   By: Kreg Shropshire M.D.   On: 06/03/2020 21:50   CT HEAD WO CONTRAST  Result Date: 05/16/2020 CLINICAL DATA:  Mental status change. Increased confusion. Dragging the left leg. History of dementia. EXAM: CT HEAD WITHOUT CONTRAST TECHNIQUE: Contiguous axial images were obtained from the base of the skull through the vertex without intravenous contrast. COMPARISON:  04/20/2020 FINDINGS: Brain: There is a large right-sided subdural hematoma which has greatly increased in size from the prior study. The hematoma is primarily hypoattenuating, however there are scattered hyperattenuating blood products as well. The hematoma measures up to 2.1 cm in thickness, and there is increased mass effect on the right cerebral hemisphere with 1.1 cm of leftward midline shift. There is partial  effacement of the lateral and third ventricles. No acute infarct is identified. Hypodensities in the cerebral white matter bilaterally are unchanged and nonspecific but compatible with mild chronic small vessel ischemic disease. A chronic lacunar infarct is again noted in the right thalamus. Vascular: Calcified atherosclerosis at the skull base. No hyperdense vessel. Skull: No fracture or suspicious osseous lesion. Sinuses/Orbits: Moderate right and mild left ethmoid sinus mucosal thickening. Clear mastoid air cells. Bilateral cataract extraction. Other: Near complete resolution of left-sided scalp hematoma. IMPRESSION: Greatly increased size of a now large right-sided subdural hematoma with increased mass effect and 1.1 cm of leftward midline shift. Critical Value/emergent results were called by telephone at the time of interpretation on 05/16/2020 at 9:57 am to Dr. Katrinka Blazing, who verbally acknowledged these results. Electronically Signed   By: Sebastian Ache M.D.   On: 05/16/2020 10:00   CT Cervical Spine Wo Contrast  Result Date: 06/03/2020 CLINICAL DATA:  Possible syncopal episode, found down EXAM: CT HEAD WITHOUT CONTRAST CT CERVICAL SPINE WITHOUT CONTRAST TECHNIQUE: Multidetector CT imaging of the head and cervical spine was performed following the standard protocol without intravenous contrast. Multiplanar CT image reconstructions of the cervical spine were also generated. COMPARISON:  CT head 05/16/2020, CT head and cervical spine 04/20/2020 FINDINGS: CT HEAD FINDINGS Brain: Patient appears to have undergone prior evacuation of the previously seen right subdural collection with placement small frontal and parietal burr holes. There is some regions of thin subjacent thickening and heterogeneity, likely dural thickening adjacent the evacuation sites however, there are some persistent lucencies of gas layering anti dependently within a residual collection which measures up to 8 mm in maximal thickness. Some more  dependently layering intermediate attenuation is seen within this collection as well which could reflect some mixed age blood products. Some persistent leftward mediastinal shift of approximately 3 mm. No evidence of acute infarction, hydrocephalus, or new extra-axial collection. Symmetric prominence of the ventricles, cisterns and sulci compatible with parenchymal volume loss. Patchy areas of white matter hypoattenuation are most compatible with chronic microvascular angiopathy. Vascular: Atherosclerotic calcification of the carotid siphons and intradural vertebral arteries. No hyperdense vessel. Skull: Postsurgical changes from burr hole placement in the right frontal and parietal bones with subjacent dural thickening. Mild overlying scalp thickening is likely postsurgical as well. No other acute or worrisome calvarial abnormalities. Sinuses/Orbits: Diffuse mural thickening in ethmoids with some pneumatized secretions in the right sphenoid sinus. Mastoids and middle ear cavities are clear. Included orbital structures are unremarkable.  Other: None CT CERVICAL SPINE FINDINGS Alignment: Chronic straightening and slight reversal the normal cervical lordosis. No evidence of traumatic listhesis. No abnormally widened, perched or jumped facets. Normal alignment of the craniocervical and atlantoaxial articulations. Skull base and vertebrae: No acute skull base fracture. No vertebral body fracture or height loss. Normal bone mineralization. No worrisome osseous lesions. Moderate atlantodental and basion dens arthrosis with early calcific pannus formation. Additional cervical spondylitic changes as below. Soft tissues and spinal canal: No pre or paravertebral fluid or swelling. No visible canal hematoma. Disc levels: Multilevel intervertebral disc height loss with spondylitic endplate changes. Features are most pronounced C5-6 with an exuberant, calcified central posterior disc osteophyte complex. Resulting in some moderate  canal stenosis. Additional smaller disc osteophyte complexes are present C3-4, C4-5 and C6-7 resulting only partial effacement of the ventral thecal sac. Disc height loss however is most pronounced at the C6-7 level with more exuberant anterior osteophytosis. Multilevel uncinate spurring facet hypertrophic changes in the cervical spine, maximal C6-7 resulting in at most moderate foraminal narrowing. Upper chest: No acute abnormality in the upper chest or imaged lung apices. Other: Cervical carotid atherosclerosis.  Normal thyroid. IMPRESSION: 1. Postsurgical changes from evacuation of the previously seen right subdural collection with placement small frontal and parietal burr holes. Dural thickening and heterogeneity is seen adjacent the evacuation sites, nonspecific. Persistent pneumo cephaly layering antidependently and some mild heterogeneous material reflecting mixed age blood products is seen layering dependently is somewhat conspicuous given the time since operative intervention. Minimal residual leftward midline shift of 3 mm. 2. No evidence of acute fracture or traumatic listhesis of the cervical spine. 3. Multilevel cervical spondylitic changes, most pronounced at C5-6 with moderate canal stenosis, and at C6-7 where there is moderate bilateral foraminal narrowing. 4. Cervical and intracranial atherosclerosis. These results were called by telephone at the time of interpretation on 06/03/2020 at 9:50 pm to provider [facility] , who verbally acknowledged these results. Electronically Signed   By: Kreg Shropshire M.D.   On: 06/03/2020 21:50   MR BRAIN WO CONTRAST  Result Date: 06/04/2020 CLINICAL DATA:  Altered mental status.  Syncopal episode. EXAM: MRI HEAD WITHOUT CONTRAST TECHNIQUE: Multiplanar, multiecho pulse sequences of the brain and surrounding structures were obtained without intravenous contrast. COMPARISON:  Head CT 06/03/2020 and MRI 08/02/2014 FINDINGS: Brain: A subdural hematoma over the  right cerebral convexity measures up to 1.3 cm in thickness with unchanged mild mass effect on the right frontal lobe and trace leftward midline shift. A small amount of pneumocephalus persists. There is also trace subdural hemorrhage over the left cerebral convexity measuring no more than 1-2 mm in thickness without mass effect, and there is a small subdural hematoma in the posterior fossa over the right cerebellar hemisphere measuring up to 5 mm in thickness without mass effect. There is scattered susceptibility artifact in bilateral cerebral sulci suggestive of chronic subarachnoid hemorrhage rather than acute hemorrhage given the lack of corresponding sulcal FLAIR abnormality or acute hemorrhage visible on the recent CT. There is a 2 mm acute cortical infarct in the left frontal operculum. T2 hyperintensities in the cerebral white matter bilaterally have mildly progressed from 2016 and are nonspecific but compatible with mild chronic small vessel ischemic disease. Small chronic infarcts are noted in the pons, cerebellum, and right thalamus. There is moderate cerebral atrophy. Vascular: Major intracranial vascular flow voids are preserved. Skull and upper cervical spine: Right-sided burr holes. No suspicious marrow lesion. Sinuses/Orbits: Bilateral cataract extraction. Mild mucosal thickening in  the paranasal sinuses. Clear mastoid air cells. Other: None. IMPRESSION: 1. Unchanged moderate-sized subdural hematoma over the right cerebral convexity with trace leftward midline shift. 2. Trace subdural hematoma over the left cerebral convexity and small subdural hematoma in the right posterior fossa without mass effect. 3. Punctate acute left frontal operculum infarct. 4. Chronic small vessel ischemic disease with multiple chronic lacunar infarcts. Electronically Signed   By: Sebastian Ache M.D.   On: 06/04/2020 14:57   ECHOCARDIOGRAM COMPLETE  Result Date: 06/04/2020    ECHOCARDIOGRAM REPORT   Patient Name:    CALHOUN REICHARDT Date of Exam: 06/04/2020 Medical Rec #:  [id number]     Height:       70.0 in Accession #:    [id number]    Weight:       151.1 lb Date of Birth:  1932-06-16    BSA:          1.853 m Patient Age:    86 years      BP:           150/76 mmHg Patient Gender: M             HR:           71 bpm. Exam Location:  [facility] Procedure: 2D Echo and Intracardiac Opacification Agent Indications:     Syncope  History:         Patient has prior history of Echocardiogram examinations. CAD,                  Prior CABG; Risk Factors:Hypertension.  Sonographer:     L Thornton-Maynard Referring Phys:  [id number] Andris [facility] Diagnosing Phys: Kristeen Miss MD IMPRESSIONS  1. Left ventricular ejection fraction, by estimation, is 50 to 55%. The left ventricle has low normal function. The left ventricle has no regional wall motion abnormalities. The left ventricular internal cavity size was mildly dilated. There is mild concentric left ventricular hypertrophy. Left ventricular diastolic parameters are consistent with Grade I diastolic dysfunction (impaired relaxation).  2. Right ventricular systolic function is normal. The right ventricular size is normal. There is mildly elevated pulmonary artery systolic pressure.  3. Left atrial size was mildly dilated.  4. The mitral valve is grossly normal. Mild to moderate mitral valve regurgitation.  5. The aortic valve is normal in structure. Aortic valve regurgitation is not visualized. No aortic stenosis is present. FINDINGS  Left Ventricle: Left ventricular ejection fraction, by estimation, is 50 to 55%. The left ventricle has low normal function. The left ventricle has no regional wall motion abnormalities. Definity contrast agent was given IV to delineate the left ventricular endocardial borders. The left ventricular internal cavity size was mildly dilated. There is mild concentric left ventricular hypertrophy. Left ventricular diastolic parameters are consistent with Grade I diastolic  dysfunction (impaired relaxation). Right Ventricle: The right ventricular size is normal. No increase in right ventricular wall thickness. Right ventricular systolic function is normal. There is mildly elevated pulmonary artery systolic pressure. The tricuspid regurgitant velocity is 2.93  m/s, and with an assumed right atrial pressure of 10 mmHg, the estimated right ventricular systolic pressure is 44.3 mmHg. Left Atrium: Left atrial size was mildly dilated. Right Atrium: Right atrial size was normal in size. Pericardium: There is no evidence of pericardial effusion. Mitral Valve: The mitral valve is grossly normal. Mild to moderate mitral valve regurgitation. Tricuspid Valve: The tricuspid valve is grossly normal. Tricuspid valve regurgitation is trivial. Aortic Valve: The aortic valve is normal  in structure. Aortic valve regurgitation is not visualized. Aortic regurgitation PHT measures 420 msec. No aortic stenosis is present. Aortic valve mean gradient measures 4.0 mmHg. Aortic valve peak gradient measures 7.6 mmHg. Aortic valve area, by VTI measures 3.20 cm. Pulmonic Valve: The pulmonic valve was normal in structure. Pulmonic valve regurgitation is not visualized. Aorta: The aortic root and ascending aorta are structurally normal, with no evidence of dilitation. IAS/Shunts: The atrial septum is grossly normal.  LEFT VENTRICLE PLAX 2D LVIDd:         4.98 cm  Diastology LVIDs:         3.61 cm  LV e' medial:    3.65 cm/s LV PW:         1.28 cm  LV E/e' medial:  23.4 LV IVS:        1.36 cm  LV e' lateral:   5.35 cm/s LVOT diam:     2.30 cm  LV E/e' lateral: 16.0 LV SV:         71 LV SV Index:   39 LVOT Area:     4.15 cm  RIGHT VENTRICLE RV S prime:     11.40 cm/s TAPSE (M-mode): 2.0 cm LEFT ATRIUM             Index LA diam:        4.30 cm 2.32 cm/m LA Vol (A2C):   66.9 ml 36.11 ml/m LA Vol (A4C):   52.6 ml 28.39 ml/m LA Biplane Vol: 65.6 ml 35.40 ml/m  AORTIC VALVE                   PULMONIC VALVE AV Area  (Vmax):    2.96 cm    PV Vmax:       1.34 m/s AV Area (Vmean):   2.87 cm    PV Peak grad:  7.2 mmHg AV Area (VTI):     3.20 cm AV Vmax:           138.00 cm/s AV Vmean:          93.400 cm/s AV VTI:            0.223 m AV Peak Grad:      7.6 mmHg AV Mean Grad:      4.0 mmHg LVOT Vmax:         98.20 cm/s LVOT Vmean:        64.550 cm/s LVOT VTI:          0.172 m LVOT/AV VTI ratio: 0.77 AI PHT:            420 msec  AORTA Ao Root diam: 3.30 cm MITRAL VALVE                TRICUSPID VALVE MV Area (PHT): 2.65 cm     TR Peak grad:   34.3 mmHg MV E velocity: 85.40 cm/s   TR Vmax:        293.00 cm/s MV A velocity: 122.00 cm/s MV E/A ratio:  0.70         SHUNTS                             Systemic VTI:  0.17 m                             Systemic Diam: 2.30 cm Kristeen Miss MD Electronically signed by Kristeen Miss MD  Signature Date/Time: 06/04/2020/2:01:57 PM    Final        Subjective: Pt doing better today.  He is resting but woke easily and answer questions.  Denies any pain, nausea, or other complaints.   Discharge Exam: Vitals:   06/05/20 1037 06/05/20 1125  BP:  114/71  Pulse: 72 75  Resp:  12  Temp:  97.8 F (36.6 C)  SpO2: 99% 99%   Vitals:   06/05/20 0307 06/05/20 1035 06/05/20 1037 06/05/20 1125  BP: (!) 143/78 (!) 110/59  114/71  Pulse: 79 67 72 75  Resp: 20 18  12   Temp: 98.5 F (36.9 C) 98.5 F (36.9 C)  97.8 F (36.6 C)  TempSrc:  Oral  Oral  SpO2: 100% 99% 99% 99%  Weight:      Height:        General: Pt is alert, awake, not in acute distress Cardiovascular: RRR, S1/S2 +, no rubs, no gallops Respiratory: CTA bilaterally, no wheezing, no rhonchi Abdominal: Soft, NT, ND, bowel sounds + Extremities: no edema, no cyanosis    The results of significant diagnostics from this hospitalization (including imaging, microbiology, ancillary and laboratory) are listed below for reference.     Microbiology: Recent Results (from the past 240 hour(s))  Resp Panel by RT-PCR (Flu  A&B, Covid) Nasopharyngeal Swab     Status: None   Collection Time: 06/03/20  8:59 PM   Specimen: Nasopharyngeal Swab; Nasopharyngeal(NP) swabs in vial transport medium  Result Value Ref Range Status   SARS Coronavirus 2 by RT PCR NEGATIVE NEGATIVE Final    Comment: (NOTE) SARS-CoV-2 target nucleic acids are NOT DETECTED.  The SARS-CoV-2 RNA is generally detectable in upper respiratory specimens during the acute phase of infection. The lowest concentration of SARS-CoV-2 viral copies this assay can detect is 138 copies/mL. A negative result does not preclude SARS-Cov-2 infection and should not be used as the sole basis for treatment or other patient management decisions. A negative result may occur with  improper specimen collection/handling, submission of specimen other than nasopharyngeal swab, presence of viral mutation(s) within the areas targeted by this assay, and inadequate number of viral copies(<138 copies/mL). A negative result must be combined with clinical observations, patient history, and epidemiological information. The expected result is Negative.  Fact Sheet for Patients:  BloggerCourse.com  Fact Sheet for Healthcare Providers:  SeriousBroker.it  This test is no t yet approved or cleared by the [place] FDA and  has been authorized for detection and/or diagnosis of SARS-CoV-2 by FDA under an Emergency Use Authorization (EUA). This EUA will remain  in effect (meaning this test can be used) for the duration of the COVID-19 declaration under Section 564(b)(1) of the Act, 21 U.S.C.section 360bbb-3(b)(1), unless the authorization is terminated  or revoked sooner.       Influenza A by PCR NEGATIVE NEGATIVE Final   Influenza B by PCR NEGATIVE NEGATIVE Final    Comment: (NOTE) The Xpert Xpress SARS-CoV-2/FLU/RSV plus assay is intended as an aid in the diagnosis of influenza from Nasopharyngeal swab specimens  and should not be used as a sole basis for treatment. Nasal washings and aspirates are unacceptable for Xpert Xpress SARS-CoV-2/FLU/RSV testing.  Fact Sheet for Patients: BloggerCourse.com  Fact Sheet for Healthcare Providers: SeriousBroker.it  This test is not yet approved or cleared by the [place] FDA and has been authorized for detection and/or diagnosis of SARS-CoV-2 by FDA under an Emergency Use Authorization (EUA). This EUA will remain in effect (meaning  this test can be used) for the duration of the COVID-19 declaration under Section 564(b)(1) of the Act, 21 U.S.C. section 360bbb-3(b)(1), unless the authorization is terminated or revoked.  Performed at [facility], [address] [place], [place] [address]      Labs: BNP (last 3 results) No results for input(s): BNP in the last 8760 hours. Basic Metabolic Panel: Recent Labs  Lab 06/03/20 2059 06/04/20 0453  NA 136 141  K 3.7 3.6  CL 100 107  CO2 24 28  GLUCOSE 125* 102*  BUN 26* 20  CREATININE 1.32* 0.98  CALCIUM 9.2 8.6*   Liver Function Tests: No results for input(s): AST, ALT, ALKPHOS, BILITOT, PROT, ALBUMIN in the last 168 hours. No results for input(s): LIPASE, AMYLASE in the last 168 hours. No results for input(s): AMMONIA in the last 168 hours. CBC: Recent Labs  Lab 06/03/20 2059  WBC 4.8  NEUTROABS 3.3  HGB 11.1*  HCT 33.5*  MCV 89.8  PLT 120*   Cardiac Enzymes: No results for input(s): CKTOTAL, CKMB, CKMBINDEX, TROPONINI in the last 168 hours. BNP: Invalid input(s): POCBNP CBG: Recent Labs  Lab 06/05/20 0559  GLUCAP 82   D-Dimer No results for input(s): DDIMER in the last 72 hours. Hgb A1c Recent Labs    06/04/20 0453  HGBA1C 5.1   Lipid Profile Recent Labs    06/04/20 0453  CHOL 134  HDL 41  LDLCALC 75  TRIG 88  CHOLHDL 3.3   Thyroid function studies Recent Labs    06/05/20 0554  TSH 2.425    Anemia work up No results for input(s): VITAMINB12, FOLATE, FERRITIN, TIBC, IRON, RETICCTPCT in the last 72 hours. Urinalysis    Component Value Date/Time   COLORURINE STRAW (A) 06/03/2020 2110   APPEARANCEUR CLEAR (A) 06/03/2020 2110   APPEARANCEUR Clear 04/04/2013 1500   LABSPEC 1.005 06/03/2020 2110   LABSPEC 1.008 04/04/2013 1500   PHURINE 6.0 06/03/2020 2110   GLUCOSEU NEGATIVE 06/03/2020 2110   GLUCOSEU 50 mg/dL 18/84/1660 6301   HGBUR NEGATIVE 06/03/2020 2110   BILIRUBINUR NEGATIVE 06/03/2020 2110   BILIRUBINUR Negative 04/04/2013 1500   KETONESUR NEGATIVE 06/03/2020 2110   PROTEINUR NEGATIVE 06/03/2020 2110   NITRITE NEGATIVE 06/03/2020 2110   LEUKOCYTESUR NEGATIVE 06/03/2020 2110   LEUKOCYTESUR Negative 04/04/2013 1500   Sepsis Labs Invalid input(s): PROCALCITONIN,  WBC,  LACTICIDVEN Microbiology Recent Results (from the past 240 hour(s))  Resp Panel by RT-PCR (Flu A&B, Covid) Nasopharyngeal Swab     Status: None   Collection Time: 06/03/20  8:59 PM   Specimen: Nasopharyngeal Swab; Nasopharyngeal(NP) swabs in vial transport medium  Result Value Ref Range Status   SARS Coronavirus 2 by RT PCR NEGATIVE NEGATIVE Final    Comment: (NOTE) SARS-CoV-2 target nucleic acids are NOT DETECTED.  The SARS-CoV-2 RNA is generally detectable in upper respiratory specimens during the acute phase of infection. The lowest concentration of SARS-CoV-2 viral copies this assay can detect is 138 copies/mL. A negative result does not preclude SARS-Cov-2 infection and should not be used as the sole basis for treatment or other patient management decisions. A negative result may occur with  improper specimen collection/handling, submission of specimen other than nasopharyngeal swab, presence of viral mutation(s) within the areas targeted by this assay, and inadequate number of viral copies(<138 copies/mL). A negative result must be combined with clinical observations, patient history,  and epidemiological information. The expected result is Negative.  Fact Sheet for Patients:  BloggerCourse.com  Fact Sheet for Healthcare  Providers:  SeriousBroker.it  This test is no t yet approved or cleared by the [place] and  has been authorized for detection and/or diagnosis of SARS-CoV-2 by FDA under an Emergency Use Authorization (EUA). This EUA will remain  in effect (meaning this test can be used) for the duration of the COVID-19 declaration under Section 564(b)(1) of the Act, 21 U.S.C.section 360bbb-3(b)(1), unless the authorization is terminated  or revoked sooner.       Influenza A by PCR NEGATIVE NEGATIVE Final   Influenza B by PCR NEGATIVE NEGATIVE Final    Comment: (NOTE) The Xpert Xpress SARS-CoV-2/FLU/RSV plus assay is intended as an aid in the diagnosis of influenza from Nasopharyngeal swab specimens and should not be used as a sole basis for treatment. Nasal washings and aspirates are unacceptable for Xpert Xpress SARS-CoV-2/FLU/RSV testing.  Fact Sheet for Patients: BloggerCourse.com  Fact Sheet for Healthcare Providers: SeriousBroker.it  This test is not yet approved or cleared by the [place] FDA and has been authorized for detection and/or diagnosis of SARS-CoV-2 by FDA under an Emergency Use Authorization (EUA). This EUA will remain in effect (meaning this test can be used) for the duration of the COVID-19 declaration under Section 564(b)(1) of the Act, 21 U.S.C. section 360bbb-3(b)(1), unless the authorization is terminated or revoked.  Performed at [facility], [address] [place], [place] [address]      Time coordinating discharge: Over 30 minutes  SIGNED:   Pennie Banter, DO [facility] 06/05/2020, 2:19 PM   If 7PM-7AM, please contact night-coverage [email or web address]

## 2020-06-05 NOTE — Plan of Care (Signed)
  Problem: Education: Goal: Ability to demonstrate management of disease process will improve Outcome: Completed/Met Goal: Ability to verbalize understanding of medication therapies will improve Outcome: Completed/Met Goal: Individualized Educational Video(s) Outcome: Completed/Met   Problem: Cardiac: Goal: Ability to achieve and maintain adequate cardiopulmonary perfusion will improve Outcome: Completed/Met   Problem: Self-Care: Goal: Ability to communicate needs accurately will improve Outcome: Completed/Met   Problem: Activity: Goal: Capacity to carry out activities will improve Outcome: Completed/Met   Problem: Acute Rehab PT Goals(only PT should resolve) Goal: Pt Will Go Supine/Side To Sit Outcome: Completed/Met Goal: Pt Will Go Sit To Supine/Side Outcome: Completed/Met Goal: Pt Will Transfer Bed To Chair/Chair To Bed Outcome: Completed/Met Goal: Pt Will Ambulate Outcome: Completed/Met Goal: Pt Will Go Up/Down Stairs Outcome: Completed/Met   Problem: Acute Rehab OT Goals (only OT should resolve) Goal: Pt. Will Perform Lower Body Dressing Outcome: Completed/Met Goal: Pt. Will Transfer To Toilet Outcome: Completed/Met

## 2020-06-05 NOTE — Evaluation (Addendum)
Clinical/Bedside Swallow Evaluation Patient Details  Name: Alex [name] MRN: [id number] Date of Birth: [birth year]  Today's Date: 06/05/2020 Time: SLP Start Time (ACUTE ONLY): 1245 SLP Stop Time (ACUTE ONLY): 1345 SLP Time Calculation (min) (ACUTE ONLY): 60 min  Past Medical History:  Past Medical History:  Diagnosis Date  . Arthritis   . CAD (coronary artery disease)    post CABG; myoview 2009 neg  . Carotid artery disease (HCC)    Mild  . Cirrhosis of liver (HCC)   . Dementia (HCC)   . GERD (gastroesophageal reflux disease)   . Hepatitis C    Inactive  . Hyperlipidemia   . Hypertension   . Idiopathic thrombocytopenia (HCC)   . Memory loss   . Myocardial infarction [facility]) 1996   [place]  . RBBB (right bundle branch block)   . Right bundle branch block (RBBB)   . Right tibial fracture 2014  . Stroke [facility]) February 2016   [facility]  . Thrombocytopenia (HCC)   . TIA (transient ischemic attack)   . TIA (transient ischemic attack)    Past Surgical History:  Past Surgical History:  Procedure Laterality Date  . CORONARY ARTERY BYPASS GRAFT    . EYE SURGERY Bilateral    Cataract Extraction  . FRACTURE SURGERY Right 1981   Rod in right femur  . JOINT REPLACEMENT    . LEG SURGERY  2014   right leg  . PARTIAL KNEE ARTHROPLASTY Right 06/14/2015   Procedure: UNICOMPARTMENTAL KNEE;  Surgeon: Christena Flake, MD;  Location: [facility] ORS;  Service: Orthopedics;  Laterality: Right;  . TIBIA FRACTURE SURGERY Right 2014   Rod in tibia  . UPPER GASTROINTESTINAL ENDOSCOPY     HPI:  Pt is a 86 y/o male w/ multiple medical issues who was in his usual state of health until the night of arrival at the nursing home when he slumped over in his chair and was minimally responsive and hypotensive. At this admit, MRI revealed unchanged moderate-sized subdural hematoma over the right  cerebral convexity with trace leftward midline shift. During previous admit to [facility], it was noted that pt is an 86 y.o.  male with a past medical history of coronary artery disease, CABG in 1996 currently on aspirin and plavix, hypertension, hyperlipidemia, stroke, hepatitis C, hepatic cirrhosis due to chronic hepatitis C infection, Dementia, and peripheral neuropathy who presents as a transfer from an outside hospital emergency department on 05/16/2020 with a chief complaint of one week of worsening altered mental status, gait change, and left sided weakness and was found to have worsening acute on chronic subdural hematoma with midline shift on brain imaging. He had been in his usual state of health until November 2021 when he suffered from a fall out of bed and was found to have a right sided epidural hematoma then. AT [facility], he was taken to the operating room and underwent right sided burr holes for evacuation of the subdural hematoma. He remained in the hospital as he awaited discharge to a skilled nursing facility. He was tolerating a regular diet, and oral pain medications. On 05/24/2020 a repeat CT brain scan was obtained which demonstrated stable/improving bleed. Pt was d/c'd to a SNF for Rehab. By arrival in the ED, patient was awake, alert and back to baseline.   Assessment / Plan / Recommendation Clinical Impression  Pt appears to present w/ grossly adequate oropharyngeal phase swallow w/ No oropharyngeal phase dysphagia noted, No neuromuscular deficits noted. Pt consumed po trials w/  No overt, clinical s/s of aspiration during po trials. Pt appears at reduced risk for aspiration following general aspiration precautions. However, pt does have a Baseline of Dementia and nontraumatic subdural hematoma on 12/6 which required transfer to [facility] where he underwent bur holes and hematoma evacuation; previous hematoma in Nov. 2021. The impact from the Dementia(per Wife), and possibly other Neuro issues, can be seen in his inattention w/ po tasks. But w/ cues, he was able to follow through w/ self-feeding and po trials w/ only  min+ cues necessary for following general aspiration precautions. During po trials, pt consumed all consistencies w/ no overt coughing, decline in vocal quality, or change in respiratory presentation during/post trials. Oral phase appeared grossly [facility] w/ timely bolus management, mastication, and control of bolus propulsion for A-P transfer for swallowing. Min extra Time needed for full mastication/clearing of solid boluses -- need for attention to the task sec. to impact from the Dementia/Cognitive decline. Oral clearing achieved w/ all trial consistencies given Time and alternating foods/liquids. OM Exam appeared [facility] w/ no unilateral weakness noted. Speech Clear, mumbled/soft. Pt fed self w/ mod setup support. Recommend a Mech Soft consistency diet w/ well-Cut meats, moistened foods; Thin liquids via Cup/Straw, monitored. Recommend general aspiration precautions, Pills Whole vs Crushed in Puree for safer, easier swallowing. Much Education given on Pills in Puree; food consistencies and easy to eat options; general aspiration precautions and Reducing distractions and Supporting self-feeding at meals. Handouts given. NSG to reconsult if any new needs arise. NSG agreed. Pt/Wife agreed.    Regarding his Cognitive-communication status, pt has Baseline Dementia which does impact Cognitive-linguistic skills of communication. In 04/20/2020 s/p initial hematoma tx, pt was seen by ST services at [facility] for assessment/dx of cognitive-communication impairment in setting of fall, right hematoma. Deficits this date included reduced attention, memory recall (inability to state reason for admission despite frequent reorientation/education), and problem solving with self-reported confusion. Pt was discharged from that admission on a regular diet w/ thin liquids and f/u w/ Rehab services at SNF, per chart notes(only had 2 visists per Wife). Recommend continued ST services to address his Cognitive-linguistic goals when he discharges  to his SNF setting again. Wife agreed to monitor f/u of services.   SLP Visit Diagnosis: Dysphagia, unspecified (R13.10) (baseline Dementia)    Aspiration Risk   (reduced following precautions, support at meals)    Diet Recommendation   Mech Soft diet w/ well-Cut meats, moistened foods for ease of effort and attention eating; Thin liquids w/ monitoring cup vs straw - pt uses a straw d/t shaky UEs. Recommend general aspiration precautions, Support w/ eating at meals -- less distractions at meals. Dietician f/u for nutritional support, needs.  Medication Administration: Whole meds with puree (vs Crushed for ease of swallowing)    Other  Recommendations Recommended Consults:  (Dietician f/u for support) Oral Care Recommendations: Oral care BID;Staff/trained caregiver to provide oral care;Oral care before and after PO Other Recommendations:  (n/a)   Follow up Recommendations None      Frequency and Duration  (n/a)   (n/a)       Prognosis Prognosis for Safe Diet Advancement: Fair (-good) Barriers to Reach Goals: Time post onset;Severity of deficits;Cognitive deficits      Swallow Study   General Date of Onset: 06/03/20 HPI: Pt is a 86 y/o male w/ multiple medical issues who was in his usual state of health until the night of arrival at the nursing home when he slumped over in his chair  and was minimally responsive and hypotensive. At this admit, MRI revealed unchanged moderate-sized subdural hematoma over the right  cerebral convexity with trace leftward midline shift. During previous admit to [facility], it was noted that pt is an 86 y.o. male with a past medical history of coronary artery disease, CABG in 1996 currently on aspirin and plavix, hypertension, hyperlipidemia, stroke, hepatitis C, hepatic cirrhosis due to chronic hepatitis C infection, Dementia, and peripheral neuropathy who presents as a transfer from an outside hospital emergency department on 05/16/2020 with a chief complaint of one  week of worsening altered mental status, gait change, and left sided weakness and was found to have worsening acute on chronic subdural hematoma with midline shift on brain imaging. He had been in his usual state of health until November 2021 when he suffered from a fall out of bed and was found to have a right sided epidural hematoma then. AT [facility], he was taken to the operating room and underwent right sided burr holes for evacuation of the subdural hematoma. He remained in the hospital as he awaited discharge to a skilled nursing facility. He was tolerating a regular diet, and oral pain medications. On 05/24/2020 a repeat CT brain scan was obtained which demonstrated stable/improving bleed. Pt was d/c'd to a SNF for Rehab. By arrival in the ED, patient was awake, alert and back to baseline. Type of Study: Bedside Swallow Evaluation Previous Swallow Assessment: during previous hospitalizations at [facility] Diet Prior to this Study: Dysphagia 1 (puree);Thin liquids Temperature Spikes Noted: No (wbc 4.8) Respiratory Status: Room air History of Recent Intubation: No Behavior/Cognition: Alert;Cooperative;Pleasant mood;Confused;Distractible;Requires cueing (Baseline Dementia) Oral Cavity Assessment: Within Functional Limits Oral Care Completed by SLP: Recent completion by staff Oral Cavity - Dentition: Adequate natural dentition Vision: Functional for self-feeding Self-Feeding Abilities: Able to feed self;Needs assist;Needs set up (dementia) Patient Positioning: Upright in bed (needed positioning support) Baseline Vocal Quality: Low vocal intensity (few words spoken) Volitional Cough: Cognitively unable to elicit Volitional Swallow: Unable to elicit    Oral/Motor/Sensory Function Overall Oral Motor/Sensory Function: Within functional limits (moreso w/ bolus management)   Ice Chips Ice chips: Within functional limits Presentation: Spoon (fed; 3 trials)   Thin Liquid Thin Liquid: Within functional  limits Presentation: Cup;Self Fed;Straw (supported: 5 trials via each)    Nectar Thick Nectar Thick Liquid: Not tested   Honey Thick Honey Thick Liquid: Not tested   Puree Puree: Within functional limits Presentation: Spoon (fed; 6 trials) Other Comments: then more w/ Wife   Solid     Solid: Within functional limits (grossly w/ Time given) Presentation: Spoon;Self Fed (8 trials) Other Comments: min extra Time needed for full mastication/clearing of boluses -- attention to the task sec. to impact from the Dementia/Cognitive decline       Jerilynn Som, MS, [employer] Speech Language Pathologist Rehab Services [phone] [place] Ivey 06/05/2020,1:50 PM

## 2020-07-13 ENCOUNTER — Other Ambulatory Visit: Payer: Self-pay | Admitting: Cardiovascular Disease

## 2020-07-14 ENCOUNTER — Encounter: Payer: Medicare Other | Admitting: Dermatology

## 2020-08-01 ENCOUNTER — Other Ambulatory Visit: Payer: Self-pay | Admitting: Internal Medicine

## 2020-08-01 DIAGNOSIS — S065XAA Traumatic subdural hemorrhage with loss of consciousness status unknown, initial encounter: Secondary | ICD-10-CM

## 2020-08-01 DIAGNOSIS — S065X9A Traumatic subdural hemorrhage with loss of consciousness of unspecified duration, initial encounter: Secondary | ICD-10-CM

## 2020-08-16 ENCOUNTER — Other Ambulatory Visit: Payer: Self-pay

## 2020-08-16 ENCOUNTER — Ambulatory Visit
Admission: RE | Admit: 2020-08-16 | Discharge: 2020-08-16 | Disposition: A | Discharge status: Home / Self Care | Payer: Medicare Other | Source: Ambulatory Visit | Attending: Internal Medicine | Admitting: Internal Medicine

## 2020-08-16 DIAGNOSIS — S065XAA Traumatic subdural hemorrhage with loss of consciousness status unknown, initial encounter: Secondary | ICD-10-CM

## 2020-08-16 DIAGNOSIS — S065X9A Traumatic subdural hemorrhage with loss of consciousness of unspecified duration, initial encounter: Secondary | ICD-10-CM | POA: Diagnosis present

## 2020-09-08 ENCOUNTER — Telehealth: Payer: Self-pay | Admitting: Cardiovascular Disease

## 2020-09-08 NOTE — Telephone Encounter (Signed)
Attemptred to return all to pt's wif Alex [name] (DPR approved) advised based on pt's chart, it looks like Irbesartan 300 mg (take 1/2 tab) daily was sent in to pharmacy as requested.  Advised to call back with any questions or concerns regarding medications refills.

## 2020-09-08 NOTE — Telephone Encounter (Signed)
Patient's wife is calling to discuss which medications were sent to pharmacy on February 1st. Please call to discuss.

## 2020-09-12 ENCOUNTER — Encounter: Payer: Medicare Other | Admitting: Dermatology

## 2020-09-18 ENCOUNTER — Emergency Department
Admission: EM | Admit: 2020-09-18 | Discharge: 2020-09-18 | Disposition: A | Discharge status: Home / Self Care | Payer: Medicare Other | Attending: Emergency Medicine | Admitting: Emergency Medicine

## 2020-09-18 ENCOUNTER — Emergency Department: Payer: Medicare Other

## 2020-09-18 ENCOUNTER — Other Ambulatory Visit: Payer: Self-pay

## 2020-09-18 DIAGNOSIS — Y93H2 Activity, gardening and landscaping: Secondary | ICD-10-CM | POA: Insufficient documentation

## 2020-09-18 DIAGNOSIS — W19XXXA Unspecified fall, initial encounter: Secondary | ICD-10-CM

## 2020-09-18 DIAGNOSIS — S0181XA Laceration without foreign body of other part of head, initial encounter: Secondary | ICD-10-CM | POA: Insufficient documentation

## 2020-09-18 DIAGNOSIS — E039 Hypothyroidism, unspecified: Secondary | ICD-10-CM | POA: Insufficient documentation

## 2020-09-18 DIAGNOSIS — I2510 Atherosclerotic heart disease of native coronary artery without angina pectoris: Secondary | ICD-10-CM | POA: Insufficient documentation

## 2020-09-18 DIAGNOSIS — Z79899 Other long term (current) drug therapy: Secondary | ICD-10-CM | POA: Insufficient documentation

## 2020-09-18 DIAGNOSIS — S0993XA Unspecified injury of face, initial encounter: Secondary | ICD-10-CM | POA: Diagnosis present

## 2020-09-18 DIAGNOSIS — W1830XA Fall on same level, unspecified, initial encounter: Secondary | ICD-10-CM | POA: Diagnosis not present

## 2020-09-18 DIAGNOSIS — Z96651 Presence of right artificial knee joint: Secondary | ICD-10-CM | POA: Insufficient documentation

## 2020-09-18 DIAGNOSIS — Z951 Presence of aortocoronary bypass graft: Secondary | ICD-10-CM | POA: Insufficient documentation

## 2020-09-18 DIAGNOSIS — I10 Essential (primary) hypertension: Secondary | ICD-10-CM | POA: Insufficient documentation

## 2020-09-18 DIAGNOSIS — F0390 Unspecified dementia without behavioral disturbance: Secondary | ICD-10-CM | POA: Diagnosis not present

## 2020-09-18 MED ORDER — LIDOCAINE HCL (PF) 1 % IJ SOLN
5.0000 mL | Freq: Once | INTRAMUSCULAR | Status: AC
  Administered 2020-09-18: 5 mL
  Filled 2020-09-18: qty 5

## 2020-09-18 NOTE — Discharge Instructions (Addendum)
Please seek medical attention for any high fevers, chest pain, shortness of breath, change in behavior, persistent vomiting, bloody stool or any other new or concerning symptoms.  

## 2020-09-18 NOTE — ED Provider Notes (Signed)
[facility] Emergency Department Provider Note    ____________________________________________   I have reviewed the triage vital signs and the nursing notes.   HISTORY  Chief Complaint Fall   History limited by and level 5 caveat due to: Dementia. History primarily obtained from family   HPI Alex [name] is a 87 y.o. male who presents to the emergency department today because of concern for a fall and laceration to left side of face. Wife states that she had been watching him as he was doing some gardening. While she did not witness it she thinks the patient stumbled and fell. She says he has a history of falls but has not had one recently. The patient did suffer lacerations to the left side of his face.     Records reviewed. Per medical record review patient has a history of falls, SDH.   Past Medical History:  Diagnosis Date  . Arthritis   . CAD (coronary artery disease)    post CABG; myoview 2009 neg  . Carotid artery disease (HCC)    Mild  . Cirrhosis of liver (HCC)   . Dementia (HCC)   . GERD (gastroesophageal reflux disease)   . Hepatitis C    Inactive  . Hyperlipidemia   . Hypertension   . Idiopathic thrombocytopenia (HCC)   . Memory loss   . Myocardial infarction [facility]) 1996   [place]  . RBBB (right bundle branch block)   . Right bundle branch block (RBBB)   . Right tibial fracture 2014  . Stroke [facility]) February 2016   [facility]  . Thrombocytopenia (HCC)   . TIA (transient ischemic attack)   . TIA (transient ischemic attack)     Patient Active Problem List   Diagnosis Date Noted  . Dementia (HCC) 06/03/2020  . History of subdural hematoma 05/16/20  06/03/2020  . Syncope 06/03/2020  . H/O Epidural hematoma 04/2020 (HCC)  06/03/2020  . Hx of CABG 06/03/2020  . Syncope and collapse 06/03/2020  . Elevated troponin 06/03/2020  . Hypotension 06/03/2020  . AKI (acute kidney injury) (HCC) 06/03/2020  . Acquired hypothyroidism  12/21/2016  . Angina pectoris (HCC)   . Pain in the chest   . History of stroke   . Essential hypertension   . Status post right partial knee replacement 06/14/2015  . CVA (cerebral vascular accident) (HCC) 11/05/2014  . History of recent fall 05/10/2014  . Broken nose 05/10/2014  . Hepatic cirrhosis due to chronic hepatitis C infection (HCC) 11/09/2013  . Chronic viral hepatitis C (HCC) 01/29/2013  . Malaise 01/07/2012  . Dizziness 12/15/2010  . Carotid stenosis 07/04/2009  . PALPITATIONS 03/23/2009  . Hyperlipidemia 12/26/2008  . HYPERTENSION, BENIGN 12/26/2008  . CAD (coronary artery disease) 12/26/2008    Past Surgical History:  Procedure Laterality Date  . CORONARY ARTERY BYPASS GRAFT    . EYE SURGERY Bilateral    Cataract Extraction  . FRACTURE SURGERY Right 1981   Rod in right femur  . JOINT REPLACEMENT    . LEG SURGERY  2014   right leg  . PARTIAL KNEE ARTHROPLASTY Right 06/14/2015   Procedure: UNICOMPARTMENTAL KNEE;  Surgeon: Christena Flake, MD;  Location: [facility] ORS;  Service: Orthopedics;  Laterality: Right;  . TIBIA FRACTURE SURGERY Right 2014   Rod in tibia  . UPPER GASTROINTESTINAL ENDOSCOPY      Prior to Admission medications   Medication Sig Start Date End Date Taking? Authorizing Provider  Cyanocobalamin (B-12) 1000 MCG TBCR Take  1,000 mcg by mouth daily. 06/05/20   Pennie Banter, DO  ezetimibe (ZETIA) 10 MG tablet Take 1 tablet (10 mg total) by mouth daily. 02/03/20 05/03/20  Antonieta Iba, MD  galantamine (RAZADYNE) 12 MG tablet Take 12 mg by mouth 2 (two) times daily.  02/26/13   [provider]  hydrALAZINE (APRESOLINE) 25 MG tablet Take 1 tablet (25 mg total) by mouth 2 (two) times daily as needed (for SBP>160 or DBP>90). 06/05/20 06/05/21  Esaw Grandchild A, DO  hydrocortisone 2.5 % cream Apply 1 application topically daily as needed.    [provider]  irbesartan (AVAPRO) 300 MG tablet TAKE 1/2 TABLET EVERYDAY 07/14/20   Antonieta Iba, MD  levothyroxine (SYNTHROID, LEVOTHROID) 75 MCG tablet Take 75 mcg by mouth daily. 12/21/16   [provider]  memantine (NAMENDA XR) 21 MG CP24 24 hr capsule Take 21 mg by mouth daily.  04/13/15   [provider]  Multiple Vitamins-Minerals (CENTRUM SILVER) tablet Take 1 tablet by mouth daily.      [provider]  nitroGLYCERIN (NITROSTAT) 0.4 MG SL tablet Place 1 tablet (0.4 mg total) under the tongue as directed. 10/23/16   Antonieta Iba, MD  omeprazole (PRILOSEC) 40 MG capsule Take 40 mg by mouth daily. 10/08/17   [provider]  QUEtiapine (SEROQUEL) 25 MG tablet Take 25 mg by mouth daily as needed for agitation. 05/24/20 06/23/20  [provider]  sertraline (ZOLOFT) 50 MG tablet Take 50 mg by mouth daily.  03/11/15   [provider]  simvastatin (ZOCOR) 40 MG tablet TAKE ONE TABLET BY MOUTH AT BEDTIME 09/09/19   Antonieta Iba, MD    Allergies Aspirin, Aspirin-dipyridamole er, Aspirin-dipyridamole er, Ciprofloxacin, Contrast media [iodinated diagnostic agents], Morphine, Other, and Venlafaxine  Family History  Problem Relation Age of Onset  . Diabetes Neg Hx   . Coronary artery disease Neg Hx     Social History Social History   Tobacco Use  . Smoking status: Never Smoker  . Smokeless tobacco: Never Used  Substance Use Topics  . Alcohol use: No  . Drug use: No    Review of Systems Constitutional: No fever/chills Eyes: No visual changes. ENT: No sore throat. Cardiovascular: Denies chest pain. Respiratory: Denies shortness of breath. Gastrointestinal: No abdominal pain.  No nausea, no vomiting.  No diarrhea.   Genitourinary: Negative for dysuria. Musculoskeletal: Negative for back pain. Skin: Laceration to left side of face.  Neurological: Negative for headaches ____________________________________________   PHYSICAL EXAM:  VITAL SIGNS: ED Triage Vitals  Enc Vitals Group     BP 09/18/20 1613 (!)  142/76     Pulse Rate 09/18/20 1613 79     Resp 09/18/20 1613 18     Temp --      Temp src --      SpO2 09/18/20 1613 94 %     Weight 09/18/20 1614 160 lb (72.6 kg)     Height 09/18/20 1614 5\' 8"  (1.727 m)     Head Circumference --      Peak Flow --      Pain Score 09/18/20 1613 3   Constitutional: Awake and alert.  Eyes: Periorbital swelling. Left subconjunctival hemorrhage.  ENT      Head: Normocephalic. Swelling around the left eye. Laceration above left eyebrow and to left side of nose and lower eye lid.       Nose: No congestion/rhinnorhea.      Mouth/Throat: Mucous membranes  are moist.      Neck: No stridor. Hematological/Lymphatic/Immunilogical: No cervical lymphadenopathy. Cardiovascular: Normal rate, regular rhythm.  No murmurs, rubs, or gallops.  Respiratory: Normal respiratory effort without tachypnea nor retractions. Breath sounds are clear and equal bilaterally. No wheezes/rales/rhonchi. Gastrointestinal: Soft and non tender. No rebound. No guarding.  Genitourinary: Deferred Musculoskeletal: Normal range of motion in all extremities. No lower extremity edema. Neurologic:  Dementia. Not completely oriented to events.  Skin:  Skin is warm, dry and intact. No rash noted. Psychiatric: Mood and affect are normal. Speech and behavior are normal. Patient exhibits appropriate insight and judgment.  ____________________________________________    LABS (pertinent positives/negatives)  None  ____________________________________________   EKG  None  ____________________________________________    RADIOLOGY  CT head/cervical spine/max face Small residual subdural decreased from previous imaging. No new bleed. No acute osseous abnormalities.  ____________________________________________   PROCEDURES  Procedures  LACERATION REPAIR Performed by: Phineas Semen Authorized by: Phineas Semen Consent: Verbal consent obtained. Risks and benefits: risks,  benefits and alternatives were discussed Consent given by: patient Patient identity confirmed: provided demographic data Prepped and Draped in normal sterile fashion Wound explored  Laceration Location: left forehead  Laceration Length: 2 cm  No Foreign Bodies seen or palpated  Anesthesia: local infiltration  Local anesthetic: lidocaine 1% without epinephrine  Anesthetic total: 1 ml  Irrigation method: syringe Amount of cleaning: standard  Skin closure: 5-0 vicryl rapide  Number of sutures: 7  Technique: simple interrupted  Patient tolerance: Patient tolerated the procedure well with no immediate complications.  ____________________________________________   INITIAL IMPRESSION / ASSESSMENT AND PLAN / ED COURSE  Pertinent labs & imaging results that were available during my care of the patient were reviewed by me and considered in my medical decision making (see chart for details).   Patient presented to the emergency department today after a fall.  Wife does think that the patient stumbled.  He did suffer lacerations to the left side of his face.  On exam he did have a laceration above his left eyebrow that did require suture closure.  Otherwise he had superficial lacerations to the left part of his nose and left eyebrow.  He does have swelling around his eye and does have a subconjunctival hemorrhage. CT head showed residual and improving SDH from known previous, no new bleeds. No acute osseous abnormality. Discussed obtaining lab work and urine however wife felt comfortable deferring at this time. Will discharge home with laceration care instructions.    ____________________________________________   FINAL CLINICAL IMPRESSION(S) / ED DIAGNOSES  Final diagnoses:  Fall, initial encounter  Facial laceration, initial encounter     Note: This dictation was prepared with Dragon dictation. Any transcriptional errors that result from this process are unintentional      Phineas Semen, MD 09/18/20 1950

## 2020-09-18 NOTE — ED Triage Notes (Addendum)
Pt wife states that he fell while pulling weeds- pt's left eye is black, has a small lac above r eye and on his nose- pt has a hx of dementia- pt does take plavix

## 2020-09-18 NOTE — ED Notes (Signed)
Dr. Goodman at bedside.  

## 2020-09-28 ENCOUNTER — Ambulatory Visit: Payer: Medicare Other | Admitting: Dermatology

## 2020-10-14 ENCOUNTER — Inpatient Hospital Stay
Admission: EM | Admit: 2020-10-14 | Discharge: 2020-10-19 | DRG: 280 | Disposition: A | Discharge status: Hospice | Payer: Medicare Other | Attending: Internal Medicine | Admitting: Internal Medicine

## 2020-10-14 ENCOUNTER — Encounter
Admission: EM | Disposition: A | Discharge status: Hospice | Payer: Self-pay | Source: Home / Self Care | Attending: Internal Medicine

## 2020-10-14 ENCOUNTER — Encounter: Payer: Self-pay | Admitting: Family Medicine

## 2020-10-14 ENCOUNTER — Other Ambulatory Visit: Payer: Self-pay

## 2020-10-14 ENCOUNTER — Emergency Department: Payer: Medicare Other

## 2020-10-14 ENCOUNTER — Observation Stay (HOSPITAL_BASED_OUTPATIENT_CLINIC_OR_DEPARTMENT_OTHER)
Admit: 2020-10-14 | Discharge: 2020-10-14 | Disposition: A | Discharge status: Home / Self Care | Payer: Medicare Other | Attending: Physician Assistant | Admitting: Physician Assistant

## 2020-10-14 DIAGNOSIS — I214 Non-ST elevation (NSTEMI) myocardial infarction: Secondary | ICD-10-CM

## 2020-10-14 DIAGNOSIS — D696 Thrombocytopenia, unspecified: Secondary | ICD-10-CM

## 2020-10-14 DIAGNOSIS — M1990 Unspecified osteoarthritis, unspecified site: Secondary | ICD-10-CM | POA: Diagnosis present

## 2020-10-14 DIAGNOSIS — E785 Hyperlipidemia, unspecified: Secondary | ICD-10-CM | POA: Diagnosis present

## 2020-10-14 DIAGNOSIS — I480 Paroxysmal atrial fibrillation: Secondary | ICD-10-CM | POA: Diagnosis present

## 2020-10-14 DIAGNOSIS — I4430 Unspecified atrioventricular block: Secondary | ICD-10-CM | POA: Diagnosis not present

## 2020-10-14 DIAGNOSIS — Z885 Allergy status to narcotic agent status: Secondary | ICD-10-CM

## 2020-10-14 DIAGNOSIS — Z951 Presence of aortocoronary bypass graft: Secondary | ICD-10-CM

## 2020-10-14 DIAGNOSIS — Z79899 Other long term (current) drug therapy: Secondary | ICD-10-CM

## 2020-10-14 DIAGNOSIS — I69822 Dysarthria following other cerebrovascular disease: Secondary | ICD-10-CM

## 2020-10-14 DIAGNOSIS — I4891 Unspecified atrial fibrillation: Secondary | ICD-10-CM

## 2020-10-14 DIAGNOSIS — Z79890 Hormone replacement therapy (postmenopausal): Secondary | ICD-10-CM

## 2020-10-14 DIAGNOSIS — I200 Unstable angina: Secondary | ICD-10-CM | POA: Diagnosis not present

## 2020-10-14 DIAGNOSIS — Z886 Allergy status to analgesic agent status: Secondary | ICD-10-CM

## 2020-10-14 DIAGNOSIS — Z20822 Contact with and (suspected) exposure to covid-19: Secondary | ICD-10-CM | POA: Diagnosis present

## 2020-10-14 DIAGNOSIS — N1831 Chronic kidney disease, stage 3a: Secondary | ICD-10-CM | POA: Diagnosis present

## 2020-10-14 DIAGNOSIS — R1310 Dysphagia, unspecified: Secondary | ICD-10-CM | POA: Diagnosis present

## 2020-10-14 DIAGNOSIS — R410 Disorientation, unspecified: Secondary | ICD-10-CM | POA: Diagnosis present

## 2020-10-14 DIAGNOSIS — K7469 Other cirrhosis of liver: Secondary | ICD-10-CM | POA: Diagnosis present

## 2020-10-14 DIAGNOSIS — Z66 Do not resuscitate: Secondary | ICD-10-CM | POA: Diagnosis present

## 2020-10-14 DIAGNOSIS — I130 Hypertensive heart and chronic kidney disease with heart failure and stage 1 through stage 4 chronic kidney disease, or unspecified chronic kidney disease: Secondary | ICD-10-CM | POA: Diagnosis present

## 2020-10-14 DIAGNOSIS — I69891 Dysphagia following other cerebrovascular disease: Secondary | ICD-10-CM

## 2020-10-14 DIAGNOSIS — R079 Chest pain, unspecified: Secondary | ICD-10-CM | POA: Diagnosis not present

## 2020-10-14 DIAGNOSIS — K219 Gastro-esophageal reflux disease without esophagitis: Secondary | ICD-10-CM | POA: Diagnosis present

## 2020-10-14 DIAGNOSIS — F05 Delirium due to known physiological condition: Secondary | ICD-10-CM | POA: Diagnosis present

## 2020-10-14 DIAGNOSIS — Z8619 Personal history of other infectious and parasitic diseases: Secondary | ICD-10-CM

## 2020-10-14 DIAGNOSIS — I5033 Acute on chronic diastolic (congestive) heart failure: Secondary | ICD-10-CM

## 2020-10-14 DIAGNOSIS — E039 Hypothyroidism, unspecified: Secondary | ICD-10-CM | POA: Diagnosis present

## 2020-10-14 DIAGNOSIS — I10 Essential (primary) hypertension: Secondary | ICD-10-CM

## 2020-10-14 DIAGNOSIS — D693 Immune thrombocytopenic purpura: Secondary | ICD-10-CM | POA: Diagnosis present

## 2020-10-14 DIAGNOSIS — Z5309 Procedure and treatment not carried out because of other contraindication: Secondary | ICD-10-CM | POA: Diagnosis not present

## 2020-10-14 DIAGNOSIS — Z9181 History of falling: Secondary | ICD-10-CM

## 2020-10-14 DIAGNOSIS — Z7902 Long term (current) use of antithrombotics/antiplatelets: Secondary | ICD-10-CM

## 2020-10-14 DIAGNOSIS — I4892 Unspecified atrial flutter: Secondary | ICD-10-CM | POA: Diagnosis not present

## 2020-10-14 DIAGNOSIS — Z7982 Long term (current) use of aspirin: Secondary | ICD-10-CM

## 2020-10-14 DIAGNOSIS — R001 Bradycardia, unspecified: Secondary | ICD-10-CM | POA: Diagnosis present

## 2020-10-14 DIAGNOSIS — F0280 Dementia in other diseases classified elsewhere without behavioral disturbance: Secondary | ICD-10-CM | POA: Diagnosis present

## 2020-10-14 DIAGNOSIS — I25110 Atherosclerotic heart disease of native coronary artery with unstable angina pectoris: Secondary | ICD-10-CM | POA: Diagnosis present

## 2020-10-14 DIAGNOSIS — F32A Depression, unspecified: Secondary | ICD-10-CM | POA: Diagnosis present

## 2020-10-14 DIAGNOSIS — F0390 Unspecified dementia without behavioral disturbance: Secondary | ICD-10-CM | POA: Diagnosis not present

## 2020-10-14 DIAGNOSIS — I252 Old myocardial infarction: Secondary | ICD-10-CM

## 2020-10-14 DIAGNOSIS — Z515 Encounter for palliative care: Secondary | ICD-10-CM

## 2020-10-14 DIAGNOSIS — G309 Alzheimer's disease, unspecified: Secondary | ICD-10-CM | POA: Diagnosis present

## 2020-10-14 DIAGNOSIS — Z881 Allergy status to other antibiotic agents status: Secondary | ICD-10-CM

## 2020-10-14 DIAGNOSIS — Z888 Allergy status to other drugs, medicaments and biological substances status: Secondary | ICD-10-CM

## 2020-10-14 DIAGNOSIS — Z96651 Presence of right artificial knee joint: Secondary | ICD-10-CM | POA: Diagnosis present

## 2020-10-14 LAB — CBC WITH DIFFERENTIAL/PLATELET
Abs Immature Granulocytes: 0.02 10*3/uL (ref 0.00–0.07)
Basophils Absolute: 0 10*3/uL (ref 0.0–0.1)
Basophils Relative: 0 %
Eosinophils Absolute: 0.3 10*3/uL (ref 0.0–0.5)
Eosinophils Relative: 6 %
HCT: 39 % (ref 39.0–52.0)
Hemoglobin: 12.8 g/dL — ABNORMAL LOW (ref 13.0–17.0)
Immature Granulocytes: 0 %
Lymphocytes Relative: 19 %
Lymphs Abs: 1 10*3/uL (ref 0.7–4.0)
MCH: 29.9 pg (ref 26.0–34.0)
MCHC: 32.8 g/dL (ref 30.0–36.0)
MCV: 91.1 fL (ref 80.0–100.0)
Monocytes Absolute: 0.4 10*3/uL (ref 0.1–1.0)
Monocytes Relative: 7 %
Neutro Abs: 3.8 10*3/uL (ref 1.7–7.7)
Neutrophils Relative %: 68 %
Platelets: 73 10*3/uL — ABNORMAL LOW (ref 150–400)
RBC: 4.28 MIL/uL (ref 4.22–5.81)
RDW: 17.8 % — ABNORMAL HIGH (ref 11.5–15.5)
WBC: 5.6 10*3/uL (ref 4.0–10.5)
nRBC: 0 % (ref 0.0–0.2)

## 2020-10-14 LAB — COMPREHENSIVE METABOLIC PANEL
ALT: 20 U/L (ref 0–44)
AST: 21 U/L (ref 15–41)
Albumin: 4.1 g/dL (ref 3.5–5.0)
Alkaline Phosphatase: 71 U/L (ref 38–126)
Anion gap: 8 (ref 5–15)
BUN: 27 mg/dL — ABNORMAL HIGH (ref 8–23)
CO2: 27 mmol/L (ref 22–32)
Calcium: 8.7 mg/dL — ABNORMAL LOW (ref 8.9–10.3)
Chloride: 104 mmol/L (ref 98–111)
Creatinine, Ser: 1.35 mg/dL — ABNORMAL HIGH (ref 0.61–1.24)
GFR, Estimated: 51 mL/min — ABNORMAL LOW (ref >60)
Glucose, Bld: 128 mg/dL — ABNORMAL HIGH (ref 70–99)
Potassium: 4 mmol/L (ref 3.5–5.1)
Sodium: 139 mmol/L (ref 135–145)
Total Bilirubin: 1.1 mg/dL (ref 0.3–1.2)
Total Protein: 6 g/dL — ABNORMAL LOW (ref 6.5–8.1)

## 2020-10-14 LAB — CBC
HCT: 35.2 % — ABNORMAL LOW (ref 39.0–52.0)
Hemoglobin: 11.7 g/dL — ABNORMAL LOW (ref 13.0–17.0)
MCH: 30.3 pg (ref 26.0–34.0)
MCHC: 33.2 g/dL (ref 30.0–36.0)
MCV: 91.2 fL (ref 80.0–100.0)
Platelets: 73 10*3/uL — ABNORMAL LOW (ref 150–400)
RBC: 3.86 MIL/uL — ABNORMAL LOW (ref 4.22–5.81)
RDW: 18 % — ABNORMAL HIGH (ref 11.5–15.5)
WBC: 5.6 10*3/uL (ref 4.0–10.5)
nRBC: 0 % (ref 0.0–0.2)

## 2020-10-14 LAB — HEMOGLOBIN A1C
Hgb A1c MFr Bld: 5.2 % (ref 4.8–5.6)
Mean Plasma Glucose: 102.54 mg/dL

## 2020-10-14 LAB — HEPARIN LEVEL (UNFRACTIONATED): Heparin Unfractionated: <0.1 IU/mL — ABNORMAL LOW (ref 0.30–0.70)

## 2020-10-14 LAB — LIPID PANEL
Cholesterol: 105 mg/dL (ref 0–200)
HDL: 36 mg/dL — ABNORMAL LOW (ref >40)
LDL Cholesterol: 58 mg/dL (ref 0–99)
Total CHOL/HDL Ratio: 2.9 RATIO
Triglycerides: 57 mg/dL (ref <150)
VLDL: 11 mg/dL (ref 0–40)

## 2020-10-14 LAB — TROPONIN I (HIGH SENSITIVITY)
Troponin I (High Sensitivity): 12292 ng/L (ref <18)
Troponin I (High Sensitivity): 3831 ng/L (ref <18)
Troponin I (High Sensitivity): 593 ng/L (ref <18)
Troponin I (High Sensitivity): 65 ng/L — ABNORMAL HIGH (ref <18)

## 2020-10-14 LAB — LIPASE, BLOOD: Lipase: 29 U/L (ref 11–51)

## 2020-10-14 LAB — RESP PANEL BY RT-PCR (FLU A&B, COVID) ARPGX2
Influenza A by PCR: NEGATIVE
Influenza B by PCR: NEGATIVE
SARS Coronavirus 2 by RT PCR: NEGATIVE

## 2020-10-14 LAB — PROTIME-INR
INR: 1.1 (ref 0.8–1.2)
Prothrombin Time: 14.4 seconds (ref 11.4–15.2)

## 2020-10-14 LAB — BRAIN NATRIURETIC PEPTIDE: B Natriuretic Peptide: 411.8 pg/mL — ABNORMAL HIGH (ref 0.0–100.0)

## 2020-10-14 SURGERY — LEFT HEART CATH AND CORS/GRAFTS ANGIOGRAPHY
Anesthesia: Moderate Sedation

## 2020-10-14 MED ORDER — MAGNESIUM HYDROXIDE 400 MG/5ML PO SUSP: 30.0000 mL | Freq: Every day | ORAL | Status: DC | PRN

## 2020-10-14 MED ORDER — GALANTAMINE HYDROBROMIDE 4 MG PO TABS
12.0000 mg | ORAL_TABLET | Freq: Two times a day (BID) | ORAL | Status: DC
  Administered 2020-10-14 – 2020-10-17 (×6): 12 mg via ORAL
  Filled 2020-10-14 (×8): qty 3

## 2020-10-14 MED ORDER — SODIUM CHLORIDE 0.9 % IV BOLUS
500.0000 mL | Freq: Once | INTRAVENOUS | Status: AC
  Administered 2020-10-14: 500 mL via INTRAVENOUS

## 2020-10-14 MED ORDER — VITAMIN B-12 1000 MCG PO TABS
1000.0000 ug | ORAL_TABLET | Freq: Every day | ORAL | Status: DC
  Administered 2020-10-15 – 2020-10-17 (×3): 1000 ug via ORAL
  Filled 2020-10-14 (×5): qty 1

## 2020-10-14 MED ORDER — TRAZODONE HCL 50 MG PO TABS: 25.0000 mg | ORAL_TABLET | Freq: Every evening | ORAL | Status: DC | PRN

## 2020-10-14 MED ORDER — SODIUM CHLORIDE 0.9% FLUSH: 3.0000 mL | INTRAVENOUS | Status: DC | PRN

## 2020-10-14 MED ORDER — PANTOPRAZOLE SODIUM 40 MG PO TBEC
40.0000 mg | DELAYED_RELEASE_TABLET | Freq: Every day | ORAL | Status: DC
  Administered 2020-10-15 – 2020-10-17 (×3): 40 mg via ORAL
  Filled 2020-10-14 (×4): qty 1

## 2020-10-14 MED ORDER — DIPHENHYDRAMINE HCL 50 MG/ML IJ SOLN: 50.0000 mg | Freq: Once | INTRAMUSCULAR | Status: AC

## 2020-10-14 MED ORDER — FUROSEMIDE 10 MG/ML IJ SOLN
20.0000 mg | Freq: Two times a day (BID) | INTRAMUSCULAR | Status: DC
  Administered 2020-10-14 – 2020-10-15 (×4): 20 mg via INTRAVENOUS
  Filled 2020-10-14 (×4): qty 2

## 2020-10-14 MED ORDER — ONDANSETRON HCL 4 MG PO TABS: 4.0000 mg | ORAL_TABLET | Freq: Four times a day (QID) | ORAL | Status: DC | PRN

## 2020-10-14 MED ORDER — ACETAMINOPHEN 650 MG RE SUPP: 650.0000 mg | Freq: Four times a day (QID) | RECTAL | Status: DC | PRN

## 2020-10-14 MED ORDER — ONDANSETRON HCL 4 MG/2ML IJ SOLN: 4.0000 mg | Freq: Four times a day (QID) | INTRAMUSCULAR | Status: DC | PRN

## 2020-10-14 MED ORDER — DILTIAZEM HCL-DEXTROSE 125-5 MG/125ML-% IV SOLN (PREMIX)
5.0000 mg/h | INTRAVENOUS | Status: DC
  Filled 2020-10-14: qty 125

## 2020-10-14 MED ORDER — QUETIAPINE FUMARATE 25 MG PO TABS
25.0000 mg | ORAL_TABLET | Freq: Every day | ORAL | Status: DC | PRN
  Administered 2020-10-14: 25 mg via ORAL
  Filled 2020-10-14: qty 1

## 2020-10-14 MED ORDER — SIMVASTATIN 20 MG PO TABS: 40.0000 mg | ORAL_TABLET | Freq: Every day | ORAL | Status: DC

## 2020-10-14 MED ORDER — SODIUM CHLORIDE 0.9 % WEIGHT BASED INFUSION: 1.0000 mL/kg/h | INTRAVENOUS | Status: DC

## 2020-10-14 MED ORDER — DILTIAZEM LOAD VIA INFUSION
15.0000 mg | Freq: Once | INTRAVENOUS | Status: DC
  Filled 2020-10-14: qty 15

## 2020-10-14 MED ORDER — MEMANTINE HCL ER 7 MG PO CP24
21.0000 mg | ORAL_CAPSULE | Freq: Every day | ORAL | Status: DC
  Administered 2020-10-15 – 2020-10-17 (×3): 21 mg via ORAL
  Filled 2020-10-14 (×4): qty 3

## 2020-10-14 MED ORDER — ACETAMINOPHEN 325 MG PO TABS: 650.0000 mg | ORAL_TABLET | Freq: Four times a day (QID) | ORAL | Status: DC | PRN

## 2020-10-14 MED ORDER — ENOXAPARIN SODIUM 40 MG/0.4ML IJ SOSY: 40.0000 mg | PREFILLED_SYRINGE | INTRAMUSCULAR | Status: DC

## 2020-10-14 MED ORDER — SERTRALINE HCL 50 MG PO TABS
50.0000 mg | ORAL_TABLET | Freq: Every day | ORAL | Status: DC
  Administered 2020-10-15 – 2020-10-17 (×3): 50 mg via ORAL
  Filled 2020-10-14 (×4): qty 1

## 2020-10-14 MED ORDER — DIPHENHYDRAMINE HCL 25 MG PO CAPS
50.0000 mg | ORAL_CAPSULE | Freq: Once | ORAL | Status: AC
  Administered 2020-10-14: 50 mg via ORAL
  Filled 2020-10-14: qty 2

## 2020-10-14 MED ORDER — HALOPERIDOL LACTATE 5 MG/ML IJ SOLN
5.0000 mg | Freq: Once | INTRAMUSCULAR | Status: AC
  Administered 2020-10-14: 5 mg via INTRAVENOUS
  Filled 2020-10-14: qty 1

## 2020-10-14 MED ORDER — NITROGLYCERIN 0.4 MG SL SUBL: 0.4000 mg | SUBLINGUAL_TABLET | SUBLINGUAL | Status: DC | PRN

## 2020-10-14 MED ORDER — DILTIAZEM HCL 25 MG/5ML IV SOLN
10.0000 mg | Freq: Once | INTRAVENOUS | Status: AC
  Administered 2020-10-14: 10 mg via INTRAVENOUS
  Filled 2020-10-14: qty 5

## 2020-10-14 MED ORDER — METHYLPREDNISOLONE SODIUM SUCC 40 MG IJ SOLR
40.0000 mg | INTRAMUSCULAR | Status: DC
Start: 2020-10-14 — End: 2020-10-14
  Administered 2020-10-14: 40 mg via INTRAVENOUS
  Filled 2020-10-14: qty 1

## 2020-10-14 MED ORDER — IRBESARTAN 150 MG PO TABS
150.0000 mg | ORAL_TABLET | Freq: Every day | ORAL | Status: DC
  Administered 2020-10-15: 150 mg via ORAL
  Filled 2020-10-14 (×3): qty 1

## 2020-10-14 MED ORDER — SODIUM CHLORIDE 0.9% FLUSH
3.0000 mL | Freq: Two times a day (BID) | INTRAVENOUS | Status: DC
  Administered 2020-10-14: 3 mL via INTRAVENOUS

## 2020-10-14 MED ORDER — ATORVASTATIN CALCIUM 20 MG PO TABS
80.0000 mg | ORAL_TABLET | Freq: Every day | ORAL | Status: DC
  Administered 2020-10-15 – 2020-10-17 (×3): 80 mg via ORAL
  Filled 2020-10-14: qty 1
  Filled 2020-10-14 (×2): qty 4
  Filled 2020-10-14: qty 1

## 2020-10-14 MED ORDER — EZETIMIBE 10 MG PO TABS
10.0000 mg | ORAL_TABLET | Freq: Every day | ORAL | Status: DC
  Administered 2020-10-15 – 2020-10-17 (×3): 10 mg via ORAL
  Filled 2020-10-14 (×5): qty 1

## 2020-10-14 MED ORDER — ASPIRIN EC 81 MG PO TBEC
81.0000 mg | DELAYED_RELEASE_TABLET | Freq: Every day | ORAL | Status: DC
  Administered 2020-10-15 – 2020-10-17 (×3): 81 mg via ORAL
  Filled 2020-10-14 (×5): qty 1

## 2020-10-14 MED ORDER — HEPARIN (PORCINE) 25000 UT/250ML-% IV SOLN
1050.0000 [IU]/h | INTRAVENOUS | Status: AC
  Administered 2020-10-14: 900 [IU]/h via INTRAVENOUS
  Administered 2020-10-15 – 2020-10-16 (×2): 1050 [IU]/h via INTRAVENOUS
  Filled 2020-10-14 (×3): qty 250

## 2020-10-14 MED ORDER — HYDROMORPHONE HCL 1 MG/ML IJ SOLN
0.5000 mg | INTRAMUSCULAR | Status: DC | PRN
  Administered 2020-10-16: 0.5 mg via INTRAVENOUS
  Filled 2020-10-14: qty 1

## 2020-10-14 MED ORDER — SODIUM CHLORIDE 0.9 % IV SOLN: INTRAVENOUS | Status: DC

## 2020-10-14 MED ORDER — HEPARIN BOLUS VIA INFUSION
4000.0000 [IU] | INTRAVENOUS | Status: AC
  Administered 2020-10-14: 4000 [IU] via INTRAVENOUS
  Filled 2020-10-14: qty 4000

## 2020-10-14 MED ORDER — CLOPIDOGREL BISULFATE 75 MG PO TABS
75.0000 mg | ORAL_TABLET | Freq: Every day | ORAL | Status: DC
  Administered 2020-10-15 – 2020-10-17 (×3): 75 mg via ORAL
  Filled 2020-10-14 (×4): qty 1

## 2020-10-14 MED ORDER — SODIUM CHLORIDE 0.9 % IV SOLN: 250.0000 mL | INTRAVENOUS | Status: DC | PRN

## 2020-10-14 MED ORDER — SODIUM CHLORIDE 0.9 % WEIGHT BASED INFUSION: 3.0000 mL/kg/h | INTRAVENOUS | Status: DC

## 2020-10-14 MED ORDER — ADULT MULTIVITAMIN W/MINERALS CH
1.0000 | ORAL_TABLET | Freq: Every day | ORAL | Status: DC
  Administered 2020-10-15 – 2020-10-17 (×3): 1 via ORAL
  Filled 2020-10-14 (×4): qty 1

## 2020-10-14 MED ORDER — HYDRALAZINE HCL 50 MG PO TABS
25.0000 mg | ORAL_TABLET | Freq: Two times a day (BID) | ORAL | Status: DC | PRN
  Administered 2020-10-14 – 2020-10-15 (×2): 25 mg via ORAL
  Filled 2020-10-14 (×2): qty 1

## 2020-10-14 MED ORDER — LEVOTHYROXINE SODIUM 50 MCG PO TABS
75.0000 ug | ORAL_TABLET | Freq: Every day | ORAL | Status: DC
  Administered 2020-10-15 – 2020-10-17 (×3): 75 ug via ORAL
  Filled 2020-10-14: qty 1
  Filled 2020-10-14: qty 2
  Filled 2020-10-14: qty 1
  Filled 2020-10-14: qty 2

## 2020-10-14 NOTE — Progress Notes (Signed)
ANTICOAGULATION CONSULT NOTE   Pharmacy Consult for heparin infusion Indication: chest pain/ACS/STEMI  Allergies  Allergen Reactions  . Aspirin Other (See Comments)    "unknown"  . Aspirin-Dipyridamole Er Other (See Comments)    "unknown"  . Aspirin-Dipyridamole Er     aggrenox  . Ciprofloxacin Other (See Comments)    "burning sensation" Other reaction(s): OTHER  . Contrast Media [Iodinated Diagnostic Agents]     Burning when having mri contrast dye -   . Morphine Other (See Comments)    "unknown" Other reaction(s): OTHER  . Other Other (See Comments)    Other reaction(s): Unknown  Other reaction(s): OTHER Burning when having mri contrast dye -  Other reaction(s): OTHER   . Venlafaxine Other (See Comments)    "unknown" Other reaction(s): OTHER  AKA Effexor Other reaction(s): OTHER     Patient Measurements: Height: 5\' 8"  (172.7 cm) Weight: 73.1 kg (161 lb 3.2 oz) IBW/kg (Calculated) : 68.4 Heparin Dosing Weight: 68 kg  Vital Signs: Temp: 97.6 F (36.4 C) (05/06 0245) Temp Source: Oral (05/06 0245) BP: 122/79 (05/06 0245) Pulse Rate: 62 (05/06 0245)  Labs: Recent Labs    10/14/20 0015 10/14/20 0214 10/14/20 0505  HGB 12.8*  --  11.7*  HCT 39.0  --  35.2*  PLT 73*  --  73*  LABPROT 14.4  --   --   INR 1.1  --   --   CREATININE 1.35*  --   --   TROPONINIHS 65* 593* 3,831*    Estimated Creatinine Clearance: 38.7 mL/min (A) (by C-G formula based on SCr of 1.35 mg/dL (H)).   Medical History: Past Medical History:  Diagnosis Date  . Arthritis   . CAD (coronary artery disease)    post CABG; myoview 2009 neg  . Carotid artery disease (HCC)    Mild  . Cirrhosis of liver (HCC)   . Dementia (HCC)   . GERD (gastroesophageal reflux disease)   . Hepatitis C    Inactive  . Hyperlipidemia   . Hypertension   . Idiopathic thrombocytopenia (HCC)   . Memory loss   . Myocardial infarction [facility]) 1996   [place]  . RBBB (right bundle branch block)   .  Right bundle branch block (RBBB)   . Right tibial fracture 2014  . Stroke [facility]) February 2016   [facility]  . Thrombocytopenia (HCC)   . TIA (transient ischemic attack)   . TIA (transient ischemic attack)      Assessment: Pt is a 87 y.o. male with med hx CAD post CABG, cirrhosis, GERD, dementia, hypertension, dyslipidemia, CVA/TIA, who presented to the ER with acute onset of chest pain that woke him up from sleep. Troponin I lvls trend up.  Pt baseline thrombocytopenic w/ PLT 73.   Goal of Therapy:  Heparin level 0.3-0.7 units/ml Monitor platelets by anticoagulation protocol: Yes   Plan:  Ordered bolus of 400 units. Ordered heparin infusion to start at 900 units/hr Will order HL and "baseline" aPTT in 8 hr after start of infusion CBC daily while on heparin.  83, PharmD, [facility] 10/14/2020 6:51 AM

## 2020-10-14 NOTE — ED Notes (Signed)
XR at bedside

## 2020-10-14 NOTE — Progress Notes (Signed)
ANTICOAGULATION CONSULT NOTE   Pharmacy Consult for heparin infusion Indication: chest pain/ACS/STEMI  Patient Measurements: Height: 5\' 8"  (172.7 cm) Weight: 73.1 kg (161 lb 3.2 oz) IBW/kg (Calculated) : 68.4 Heparin Dosing Weight: 68 kg  Vital Signs: Temp: 97.9 F (36.6 C) (05/06 1415) Temp Source: Oral (05/06 1135) BP: 140/92 (05/06 1415) Pulse Rate: 67 (05/06 1415)  Labs: Recent Labs    10/14/20 0015 10/14/20 0214 10/14/20 0505 10/14/20 1035  HGB 12.8*  --  11.7*  --   HCT 39.0  --  35.2*  --   PLT 73*  --  73*  --   LABPROT 14.4  --   --   --   INR 1.1  --   --   --   CREATININE 1.35*  --   --   --   TROPONINIHS 65* 593* 3,831* 12,292*    Estimated Creatinine Clearance: 38.7 mL/min (A) (by C-G formula based on SCr of 1.35 mg/dL (H)).   Medical History: Past Medical History:  Diagnosis Date  . Arthritis   . CAD (coronary artery disease)    post CABG; myoview 2009 neg  . Carotid artery disease (HCC)    Mild  . Cirrhosis of liver (HCC)   . Dementia (HCC)   . GERD (gastroesophageal reflux disease)   . Hepatitis C    Inactive  . Hyperlipidemia   . Hypertension   . Idiopathic thrombocytopenia (HCC)   . Memory loss   . Myocardial infarction [facility]) 1996   [place]  . RBBB (right bundle branch block)   . Right bundle branch block (RBBB)   . Right tibial fracture 2014  . Stroke [facility]) February 2016   [facility]  . Thrombocytopenia (HCC)   . TIA (transient ischemic attack)   . TIA (transient ischemic attack)      Assessment: Pt is a 87 y.o. male with med hx CAD post CABG, cirrhosis, GERD, dementia, hypertension, dyslipidemia, CVA/TIA, who presented to the ER with acute onset of chest pain that woke him up from sleep. Pt baseline thrombocytopenic w/ PLT 73. Heparin was started early this morning and paused an unknown time for an attempted LHC and PCI which was unable to be completed.   Goal of Therapy:  Heparin level 0.3-0.7 units/ml Monitor platelets by  anticoagulation protocol: Yes   Plan:   Anti-Xa level drawn at 1636 rendered useless due to the fact that heparin was held for cath prior to being drawn  Restart heparin infusion at 900 units/hr  Anti-Xa level in 8 hours after restart of infusion  CBC daily while on heparin.  83, PharmD 10/14/2020 5:08 PM

## 2020-10-14 NOTE — H&P (Addendum)
[place]   PATIENT NAME: Alex [name]    MR#:  [id number]  DATE OF BIRTH:  1932-11-14  DATE OF ADMISSION:  10/14/2020  PRIMARY CARE PHYSICIAN: Danella Penton, MD   Patient is coming from: Home.  REQUESTING/REFERRING PHYSICIAN: Chiquita Loth, MD  CHIEF COMPLAINT:   Chief Complaint  Patient presents with  . Chest Pain    Pt presenting from home via EMS with reports of chest pain that woke him up out of sleep. Wife gave pt 3 SLG nitro with no relief. EMS EKG noted pt to be in rapid afib, HR 160s. Given 324 ASA, 5mg  metoprolol and NS en route. HR dropped to 140s. Pt has cardiac hx but denies hx of afib    HISTORY OF PRESENT ILLNESS:  Alex [name] is a 87 y.o. Caucasian male with medical history significant for coronary artery disease status post CABG, carotid artery disease, liver cirrhosis, GERD, dementia, hypertension, dyslipidemia, CVA/TIA, who presented to the ER with acute onset of chest pain that woke him up from sleep.  It apparently felt like pressure but he denied any associated palpitations.  The patient is a very poor historian due to his Alzheimer's dementia.  No reported fever or chills per his wife.  No reported cough or wheezing or hemoptysis.  No bleeding diathesis. the patient was given 3 sublingual nitroglycerin by his wife with no relief.  He later received 4 baby aspirin by EMS.  He was noted to be in atrial fibrillation with rapid ventricular response for which she was given 5 mg of IV Lopressor and 500 mL of IV normal saline bolus.  His heart rate dropped from 160s to 140s.  He was then given 10 mg of IV Cardizem in the ER.  ED Course: Upon presentation to the emergency room, heart rate was 121 with otherwise normal vital signs.  Labs revealed a creatinine of 1.35 compared to 0.98 on 06/04/2020.  BNP was 411.8 and high-sensitivity troponin I was 65.  CBC showed thrombocytopenia of 73.  INR was 1.1 and PT 14.4.  Influenza antigens and COVID-19 PCR came back  negative.  EKG as reviewed by me :EKG showed sinus rhythm with a rate of 56 with right bundle branch block and LVH with IVCD and secondary repolarization with T wave inversion and ST segment depression laterally. Imaging: Chest x-ray showed as medium sternotomy, cardiomegaly and mild pulmonary vascular congestion.  The patient will be admitted to an observation progressive unit bed for further evaluation and management. PAST MEDICAL HISTORY:   Past Medical History:  Diagnosis Date  . Arthritis   . CAD (coronary artery disease)    post CABG; myoview 2009 neg  . Carotid artery disease (HCC)    Mild  . Cirrhosis of liver (HCC)   . Dementia (HCC)   . GERD (gastroesophageal reflux disease)   . Hepatitis C    Inactive  . Hyperlipidemia   . Hypertension   . Idiopathic thrombocytopenia (HCC)   . Memory loss   . Myocardial infarction [facility]) 1996   [place]  . RBBB (right bundle branch block)   . Right bundle branch block (RBBB)   . Right tibial fracture 2014  . Stroke [facility]) February 2016   [facility]  . Thrombocytopenia (HCC)   . TIA (transient ischemic attack)   . TIA (transient ischemic attack)     PAST SURGICAL HISTORY:   Past Surgical History:  Procedure Laterality Date  . CORONARY ARTERY BYPASS  GRAFT    . EYE SURGERY Bilateral    Cataract Extraction  . FRACTURE SURGERY Right 1981   Rod in right femur  . JOINT REPLACEMENT    . LEG SURGERY  2014   right leg  . PARTIAL KNEE ARTHROPLASTY Right 06/14/2015   Procedure: UNICOMPARTMENTAL KNEE;  Surgeon: Christena FlakeJohn J Poggi, MD;  Location: [facility] ORS;  Service: Orthopedics;  Laterality: Right;  . TIBIA FRACTURE SURGERY Right 2014   Rod in tibia  . UPPER GASTROINTESTINAL ENDOSCOPY      SOCIAL HISTORY:   Social History   Tobacco Use  . Smoking status: Never Smoker  . Smokeless tobacco: Never Used  Substance Use Topics  . Alcohol use: No    FAMILY HISTORY:   Family History  Problem Relation Age of Onset  . Diabetes Neg Hx   .  Coronary artery disease Neg Hx     DRUG ALLERGIES:   Allergies  Allergen Reactions  . Aspirin Other (See Comments)    "unknown"  . Aspirin-Dipyridamole Er Other (See Comments)    "unknown"  . Aspirin-Dipyridamole Er     aggrenox  . Ciprofloxacin Other (See Comments)    "burning sensation" Other reaction(s): OTHER  . Contrast Media [Iodinated Diagnostic Agents]     Burning when having mri contrast dye -   . Morphine Other (See Comments)    "unknown" Other reaction(s): OTHER  . Other Other (See Comments)    Other reaction(s): Unknown  Other reaction(s): OTHER Burning when having mri contrast dye -  Other reaction(s): OTHER   . Venlafaxine Other (See Comments)    "unknown" Other reaction(s): OTHER  AKA Effexor Other reaction(s): OTHER     REVIEW OF SYSTEMS:   ROS As per history of present illness. All pertinent systems were reviewed above. Constitutional, HEENT, cardiovascular, respiratory, GI, GU, musculoskeletal, neuro, psychiatric, endocrine, integumentary and hematologic systems were reviewed and are otherwise negative/unremarkable except for positive findings mentioned above in the HPI.   MEDICATIONS AT HOME:   Prior to Admission medications   Medication Sig Start Date End Date Taking? Authorizing Provider  aspirin 81 MG EC tablet Take 81 mg by mouth daily.   Yes [provider]  clopidogrel (PLAVIX) 75 MG tablet Take 1 tablet by mouth daily. 09/22/20  Yes [provider]  Cyanocobalamin (B-12) 1000 MCG TBCR Take 1,000 mcg by mouth daily. 06/05/20  Yes Esaw GrandchildGriffith, Kelly A, DO  ezetimibe (ZETIA) 10 MG tablet Take 1 tablet (10 mg total) by mouth daily. 02/03/20 05/03/20 Yes Gollan, Tollie Pizzaimothy J, MD  galantamine (RAZADYNE) 12 MG tablet Take 12 mg by mouth 2 (two) times daily.  02/26/13  Yes [provider]  irbesartan (AVAPRO) 300 MG tablet TAKE 1/2 TABLET EVERYDAY 07/14/20  Yes Gollan, Tollie Pizzaimothy J, MD  levothyroxine (SYNTHROID, LEVOTHROID) 75 MCG  tablet Take 75 mcg by mouth daily. 12/21/16  Yes [provider]  memantine (NAMENDA XR) 21 MG CP24 24 hr capsule Take 21 mg by mouth daily.  04/13/15  Yes [provider]  Multiple Vitamins-Minerals (CENTRUM SILVER) tablet Take 1 tablet by mouth daily.     Yes [provider]  omeprazole (PRILOSEC) 40 MG capsule Take 40 mg by mouth daily. 10/08/17  Yes [provider]  QUEtiapine (SEROQUEL) 25 MG tablet Take 25 mg by mouth daily as needed for agitation. 05/24/20 10/14/20 Yes [provider]  sertraline (ZOLOFT) 50 MG tablet Take 50 mg by mouth daily.  03/11/15  Yes [provider]  simvastatin (ZOCOR)  40 MG tablet TAKE ONE TABLET BY MOUTH AT BEDTIME 09/09/19  Yes Gollan, Tollie Pizza, MD  nitroGLYCERIN (NITROSTAT) 0.4 MG SL tablet Place 1 tablet (0.4 mg total) under the tongue as directed. 10/23/16   Antonieta Iba, MD      VITAL SIGNS:  Blood pressure 97/67, pulse (!) 59, temperature 98.2 F (36.8 C), temperature source Oral, resp. rate 16, height 5\' 8"  (1.727 m), weight 68 kg, SpO2 98 %.  PHYSICAL EXAMINATION:  Physical Exam  GENERAL:  87 y.o.-year-old Caucasian male patient lying in the bed with no acute distress.  EYES: Pupils equal, round, reactive to light and accommodation. No scleral icterus. Extraocular muscles intact.  HEENT: Head atraumatic, normocephalic. Oropharynx and nasopharynx clear.  NECK:  Supple, no jugular venous distention. No thyroid enlargement, no tenderness.  LUNGS: Normal breath sounds bilaterally, no wheezing, rales,rhonchi or crepitation. No use of accessory muscles of respiration.  CARDIOVASCULAR: Irregularly irregular rhythm, S1, S2 normal. No murmurs, rubs, or gallops.  ABDOMEN: Soft, nondistended, nontender. Bowel sounds present. No organomegaly or mass.  EXTREMITIES: No pedal edema, cyanosis, or clubbing.  NEUROLOGIC: Cranial nerves II through XII are intact. Muscle strength 5/5 in all extremities. Sensation  intact. Gait not checked.  PSYCHIATRIC: The patient is alert and oriented x 3.  Normal affect and good eye contact. SKIN: No obvious rash, lesion, or ulcer.   LABORATORY PANEL:   CBC Recent Labs  Lab 10/14/20 0015  WBC 5.6  HGB 12.8*  HCT 39.0  PLT 73*   ------------------------------------------------------------------------------------------------------------------  Chemistries  Recent Labs  Lab 10/14/20 0015  NA 139  K 4.0  CL 104  CO2 27  GLUCOSE 128*  BUN 27*  CREATININE 1.35*  CALCIUM 8.7*  AST 21  ALT 20  ALKPHOS 71  BILITOT 1.1   ------------------------------------------------------------------------------------------------------------------  Cardiac Enzymes No results for input(s): TROPONINI in the last 168 hours. ------------------------------------------------------------------------------------------------------------------  RADIOLOGY:  DG Chest Port 1 View  Result Date: 10/14/2020 CLINICAL DATA:  Chest pain. EXAM: PORTABLE CHEST 1 VIEW COMPARISON:  August 19, 2015 FINDINGS: Multiple sternal wires and vascular clips are noted. Chronic appearing increased lung markings are seen. There is mild prominence of the perihilar pulmonary vasculature. There is no evidence of acute infiltrate, pleural effusion or pneumothorax. The cardiac silhouette is mildly enlarged. Degenerative changes seen throughout the thoracic spine. IMPRESSION: 1. Evidence of prior median sternotomy/CABG. 2. Cardiomegaly with mild pulmonary vascular congestion. Electronically Signed   By: August 21, 2015 M.D.   On: 10/14/2020 00:53      IMPRESSION AND PLAN:  Active Problems:   Chest pain  1.  Atrial fibrillation with rapid ventricular response. - The patient will be admitted to an observation progressive unit bed. - We will continue monitoring the patient at this time. - The patient is not a candidate for anticoagulation due to recent history of subdural hematoma.  2.  Chest pain,  rule out acute coronary syndrome with history of coronary artery disease. - We will follow serial troponin I's. - Cardiology consult will be obtained. - The patient will be placed on sublingual nitroglycerin and IV Dilaudid as needed for pain. - We will continue his aspirin and Plavix.  3.  Essential hypertension. - We will continue ARB therapy.  4.  Dyslipidemia. - We will continue statin therapy and Zetia.  5.  Dementia with depression and behavioral changes. - We will continue Razadyne, Zoloft and Seroquel.  6.  GERD. - We will continue PPI therapy.  7.  Hypothyroidism. - We  will continue Synthroid.  8.  History of CVA and TIA. - We will continue Keppra.   DVT prophylaxis: Lovenox. Code Status: full code. Family Communication:  The plan of care was discussed in details with the patient (and family). I answered all questions. The patient agreed to proceed with the above mentioned plan. Further management will depend upon hospital course. Disposition Plan: Back to previous home environment Consults called: Cardiology consult. All the records are reviewed and case discussed with ED provider.  Status is: Observation  The patient remains OBS appropriate and will d/c before 2 midnights.  Dispo: The patient is from: Home              Anticipated d/c is to: Home              Patient currently is not medically stable to d/c.   Difficult to place patient No   TOTAL TIME TAKING CARE OF THIS PATIENT: 55 minutes.    Hannah Beat M.D on 10/14/2020 at 1:55 AM  [facility]   From 7 PM-7 AM, contact night-coverage [email or web address]  CC: Primary care physician; Danella Penton, MD

## 2020-10-14 NOTE — Progress Notes (Signed)
PROGRESS NOTE    Alex [name]  FAO:[id number] DOB: 1932/09/07 DOA: 10/14/2020 PCP: Danella Penton, MD   Chief Complaint.  Chest pain. Brief Narrative:  Alex [name] is a 87 y.o. Caucasian male with medical history significant for coronary artery disease status post CABG, carotid artery disease, liver cirrhosis, GERD, dementia, hypertension, dyslipidemia, CVA/TIA, who presented to the ER with acute onset of chest pain that woke him up from sleep.   Upon arriving the emergency room, EKG showed atrial fibrillation with rapid ventricular response, peak troponin 3831.  Patient is seen by cardiology, cardiac cath planned.   Assessment & Plan:   Active Problems:   Chest pain  #1.  Non-ST elevation myocardial infarction. Acute on chronic diastolic congestive heart failure Paroxysmal atrial fibrillation with rapid ventricle response. Patient has been seen by cardiology, going to heart cath.  Continue heparin drip. Patient also given IV Lasix.  Short of breath improving. Heart rate much better today.  2.  Essential hypertension. Blood pressure is stable.  #3.  Dementia.  #4.  Thrombocytopenia. Repeat CBC tomorrow.  5.  Chronic kidney disease stage IIIa. Renal function still stable.    DVT prophylaxis:Hepari  Code Status: full Family Communication: Wife at bedside Disposition Plan:  .   Status is: Observation  The patient remains OBS appropriate and will d/c before 2 midnights.  Dispo: The patient is from: Home              Anticipated d/c is to: Home              Patient currently is not medically stable to d/c.   Difficult to place patient No        I/O last 3 completed shifts: In: 971.6 [I.V.:538.3; IV Piggyback:433.3] Out: -  No intake/output data recorded.     Consultants:   Cardiology  Procedures: Heart cath  Antimicrobials: None  Subjective: Patient feels better today, chest pain has resolved.  Not see any short of breath.  No palpitation. No  abdominal pain or nausea vomiting. No dysuria hematuria.  Objective: Vitals:   10/14/20 0130 10/14/20 0245 10/14/20 0842 10/14/20 1135  BP: 97/67 122/79 (!) 109/59 110/66  Pulse: (!) 59 62 68 84  Resp: 16 16 12 19   Temp:  97.6 F (36.4 C) 98.9 F (37.2 C) 98.9 F (37.2 C)  TempSrc:  Oral Oral Oral  SpO2: 98% 96% 95% 96%  Weight:  73.1 kg    Height:        Intake/Output Summary (Last 24 hours) at 10/14/2020 1346 Last data filed at 10/14/2020 0600 Gross per 24 hour  Intake 971.64 ml  Output --  Net 971.64 ml   Filed Weights   10/14/20 0017 10/14/20 0245  Weight: 68 kg 73.1 kg    Examination:  General exam: Appears calm and comfortable  Respiratory system: Clear to auscultation. Respiratory effort normal. Cardiovascular system: S1 & S2 heard, RRR. No JVD, murmurs, rubs, gallops or clicks. No pedal edema. Gastrointestinal system: Abdomen is nondistended, soft and nontender. No organomegaly or masses felt. Normal bowel sounds heard. Central nervous system: Alert and oriented. No focal neurological deficits. Extremities: Symmetric 5 x 5 power. Skin: No rashes, lesions or ulcers Psychiatry: Judgement and insight appear normal. Mood & affect appropriate.     Data Reviewed: I have personally reviewed following labs and imaging studies  CBC: Recent Labs  Lab 10/14/20 0015 10/14/20 0505  WBC 5.6 5.6  NEUTROABS 3.8  --   HGB 12.8*  11.7*  HCT 39.0 35.2*  MCV 91.1 91.2  PLT 73* 73*   Basic Metabolic Panel: Recent Labs  Lab 10/14/20 0015  NA 139  K 4.0  CL 104  CO2 27  GLUCOSE 128*  BUN 27*  CREATININE 1.35*  CALCIUM 8.7*   GFR: Estimated Creatinine Clearance: 38.7 mL/min (A) (by C-G formula based on SCr of 1.35 mg/dL (H)). Liver Function Tests: Recent Labs  Lab 10/14/20 0015  AST 21  ALT 20  ALKPHOS 71  BILITOT 1.1  PROT 6.0*  ALBUMIN 4.1   Recent Labs  Lab 10/14/20 0015  LIPASE 29   No results for input(s): AMMONIA in the last 168  hours. Coagulation Profile: Recent Labs  Lab 10/14/20 0015  INR 1.1   Cardiac Enzymes: No results for input(s): CKTOTAL, CKMB, CKMBINDEX, TROPONINI in the last 168 hours. BNP (last 3 results) No results for input(s): PROBNP in the last 8760 hours. HbA1C: No results for input(s): HGBA1C in the last 72 hours. CBG: No results for input(s): GLUCAP in the last 168 hours. Lipid Profile: Recent Labs    10/14/20 0505  CHOL 105  HDL 36*  LDLCALC 58  TRIG 57  CHOLHDL 2.9   Thyroid Function Tests: No results for input(s): TSH, T4TOTAL, FREET4, T3FREE, THYROIDAB in the last 72 hours. Anemia Panel: No results for input(s): VITAMINB12, FOLATE, FERRITIN, TIBC, IRON, RETICCTPCT in the last 72 hours. Sepsis Labs: No results for input(s): PROCALCITON, LATICACIDVEN in the last 168 hours.  Recent Results (from the past 240 hour(s))  Resp Panel by RT-PCR (Flu A&B, Covid) Nasopharyngeal Swab     Status: None   Collection Time: 10/14/20 12:29 AM   Specimen: Nasopharyngeal Swab; Nasopharyngeal(NP) swabs in vial transport medium  Result Value Ref Range Status   SARS Coronavirus 2 by RT PCR NEGATIVE NEGATIVE Final    Comment: (NOTE) SARS-CoV-2 target nucleic acids are NOT DETECTED.  The SARS-CoV-2 RNA is generally detectable in upper respiratory specimens during the acute phase of infection. The lowest concentration of SARS-CoV-2 viral copies this assay can detect is 138 copies/mL. A negative result does not preclude SARS-Cov-2 infection and should not be used as the sole basis for treatment or other patient management decisions. A negative result may occur with  improper specimen collection/handling, submission of specimen other than nasopharyngeal swab, presence of viral mutation(s) within the areas targeted by this assay, and inadequate number of viral copies(<138 copies/mL). A negative result must be combined with clinical observations, patient history, and epidemiological information.  The expected result is Negative.  Fact Sheet for Patients:  BloggerCourse.com  Fact Sheet for Healthcare Providers:  SeriousBroker.it  This test is no t yet approved or cleared by the [place] FDA and  has been authorized for detection and/or diagnosis of SARS-CoV-2 by FDA under an Emergency Use Authorization (EUA). This EUA will remain  in effect (meaning this test can be used) for the duration of the COVID-19 declaration under Section 564(b)(1) of the Act, 21 U.S.C.section 360bbb-3(b)(1), unless the authorization is terminated  or revoked sooner.       Influenza A by PCR NEGATIVE NEGATIVE Final   Influenza B by PCR NEGATIVE NEGATIVE Final    Comment: (NOTE) The Xpert Xpress SARS-CoV-2/FLU/RSV plus assay is intended as an aid in the diagnosis of influenza from Nasopharyngeal swab specimens and should not be used as a sole basis for treatment. Nasal washings and aspirates are unacceptable for Xpert Xpress SARS-CoV-2/FLU/RSV testing.  Fact Sheet for Patients: BloggerCourse.com  Fact  Sheet for Healthcare Providers: SeriousBroker.it  This test is not yet approved or cleared by the [place] and has been authorized for detection and/or diagnosis of SARS-CoV-2 by FDA under an Emergency Use Authorization (EUA). This EUA will remain in effect (meaning this test can be used) for the duration of the COVID-19 declaration under Section 564(b)(1) of the Act, 21 U.S.C. section 360bbb-3(b)(1), unless the authorization is terminated or revoked.  Performed at [facility], [address] [place], [place] [address]          Radiology Studies: [facility] Chest [place] 1 View  Result Date: 10/14/2020 CLINICAL DATA:  Chest pain. EXAM: PORTABLE CHEST 1 VIEW COMPARISON:  August 19, 2015 FINDINGS: Multiple sternal wires and vascular clips are noted. Chronic appearing increased  lung markings are seen. There is mild prominence of the perihilar pulmonary vasculature. There is no evidence of acute infiltrate, pleural effusion or pneumothorax. The cardiac silhouette is mildly enlarged. Degenerative changes seen throughout the thoracic spine. IMPRESSION: 1. Evidence of prior median sternotomy/CABG. 2. Cardiomegaly with mild pulmonary vascular congestion. Electronically Signed   By: Aram Candela M.D.   On: 10/14/2020 00:53        Scheduled Meds: . aspirin EC  81 mg Oral Daily  . atorvastatin  80 mg Oral Daily  . clopidogrel  75 mg Oral Daily  . diphenhydrAMINE  50 mg Oral Once   Or  . diphenhydrAMINE  50 mg Intravenous Once  . ezetimibe  10 mg Oral Daily  . furosemide  20 mg Intravenous Q12H  . galantamine  12 mg Oral BID  . irbesartan  150 mg Oral Daily  . levothyroxine  75 mcg Oral Q0600  . memantine  21 mg Oral Daily  . methylPREDNISolone (SOLU-MEDROL) injection  40 mg Intravenous Q4H  . multivitamin with minerals  1 tablet Oral Daily  . pantoprazole  40 mg Oral Daily  . sertraline  50 mg Oral Daily  . sodium chloride flush  3 mL Intravenous Q12H  . vitamin B-12  1,000 mcg Oral Daily   Continuous Infusions: . sodium chloride    . [START ON 10/15/2020] sodium chloride     Followed by  . [START ON 10/15/2020] sodium chloride    . heparin 900 Units/hr (10/14/20 0741)     LOS: 0 days    Time spent: no charge    Marrion Coy, MD [facility]   To contact the attending provider between 7A-7P or the covering provider during after hours 7P-7A, please log into the web site [email or web address] and access using universal [place] password for that web site. If you do not have the password, please call the hospital operator.  10/14/2020, 1:46 PM

## 2020-10-14 NOTE — ED Provider Notes (Signed)
[facility] Emergency Department Provider Note   ____________________________________________   Event Date/Time   First MD Initiated Contact with Patient 10/14/20 0010     (approximate)  I have reviewed the triage vital signs and the nursing notes.   HISTORY  Chief Complaint Chest Pain (Pt presenting from home via EMS with reports of chest pain that woke him up out of sleep. Wife gave pt 3 SLG nitro with no relief. EMS EKG noted pt to be in rapid afib, HR 160s. Given 324 ASA, 5mg  metoprolol and NS en route. HR dropped to 140s. Pt has cardiac hx but denies hx of afib)  Level V caveat: limited by dementia  HPI Alex [name] is a 87 y.o. male brought to the ED from home via EMS with a chief complaint of chest pain that woke him out of sleep.  Patient with a history of CAD, dementia, hypertension, hyperlipidemia, idiopathic thrombocytopenia with subdural hemorrhage in December 2021 off aspirin and Plavix.  Wife gave 3 sublingual nitroglycerin without relief of symptoms.  Patient given 324 mg aspirin, 5 mg IV metoprolol and 500 mL normal saline on route to the ED for monitor demonstrating atrial fibrillation with rapid ventricular rate in the 160s.  Patient has no documented history of atrial fibrillation.  History is limited secondary to patient's dementia but he denies abdominal pain, nausea, vomiting or dizziness.     Past Medical History:  Diagnosis Date  . Arthritis   . CAD (coronary artery disease)    post CABG; myoview 2009 neg  . Carotid artery disease (HCC)    Mild  . Cirrhosis of liver (HCC)   . Dementia (HCC)   . GERD (gastroesophageal reflux disease)   . Hepatitis C    Inactive  . Hyperlipidemia   . Hypertension   . Idiopathic thrombocytopenia (HCC)   . Memory loss   . Myocardial infarction [facility]) 1996   [place]  . RBBB (right bundle branch block)   . Right bundle branch block (RBBB)   . Right tibial fracture 2014  . Stroke [facility])  February 2016   [facility]  . Thrombocytopenia (HCC)   . TIA (transient ischemic attack)   . TIA (transient ischemic attack)     Patient Active Problem List   Diagnosis Date Noted  . Chest pain 10/14/2020  . Dementia (HCC) 06/03/2020  . History of subdural hematoma 05/16/20  06/03/2020  . Syncope 06/03/2020  . H/O Epidural hematoma 04/2020 (HCC)  06/03/2020  . Hx of CABG 06/03/2020  . Syncope and collapse 06/03/2020  . Elevated troponin 06/03/2020  . Hypotension 06/03/2020  . AKI (acute kidney injury) (HCC) 06/03/2020  . Acquired hypothyroidism 12/21/2016  . Angina pectoris (HCC)   . Pain in the chest   . History of stroke   . Essential hypertension   . Status post right partial knee replacement 06/14/2015  . CVA (cerebral vascular accident) (HCC) 11/05/2014  . History of recent fall 05/10/2014  . Broken nose 05/10/2014  . Hepatic cirrhosis due to chronic hepatitis C infection (HCC) 11/09/2013  . Chronic viral hepatitis C (HCC) 01/29/2013  . Malaise 01/07/2012  . Dizziness 12/15/2010  . Carotid stenosis 07/04/2009  . PALPITATIONS 03/23/2009  . Hyperlipidemia 12/26/2008  . HYPERTENSION, BENIGN 12/26/2008  . CAD (coronary artery disease) 12/26/2008    Past Surgical History:  Procedure Laterality Date  . CORONARY ARTERY BYPASS GRAFT    . EYE SURGERY Bilateral    Cataract Extraction  . FRACTURE  SURGERY Right 1981   Rod in right femur  . JOINT REPLACEMENT    . LEG SURGERY  2014   right leg  . PARTIAL KNEE ARTHROPLASTY Right 06/14/2015   Procedure: UNICOMPARTMENTAL KNEE;  Surgeon: Christena Flake, MD;  Location: [facility] ORS;  Service: Orthopedics;  Laterality: Right;  . TIBIA FRACTURE SURGERY Right 2014   Rod in tibia  . UPPER GASTROINTESTINAL ENDOSCOPY      Prior to Admission medications   Medication Sig Start Date End Date Taking? Authorizing Provider  aspirin 81 MG EC tablet Take 81 mg by mouth daily.   Yes [provider]  clopidogrel (PLAVIX) 75 MG tablet Take 1  tablet by mouth daily. 09/22/20  Yes [provider]  Cyanocobalamin (B-12) 1000 MCG TBCR Take 1,000 mcg by mouth daily. 06/05/20  Yes Esaw Grandchild A, DO  ezetimibe (ZETIA) 10 MG tablet Take 1 tablet (10 mg total) by mouth daily. 02/03/20 05/03/20 Yes Gollan, Tollie Pizza, MD  galantamine (RAZADYNE) 12 MG tablet Take 12 mg by mouth 2 (two) times daily.  02/26/13  Yes [provider]  irbesartan (AVAPRO) 300 MG tablet TAKE 1/2 TABLET EVERYDAY 07/14/20  Yes Gollan, Tollie Pizza, MD  levothyroxine (SYNTHROID, LEVOTHROID) 75 MCG tablet Take 75 mcg by mouth daily. 12/21/16  Yes [provider]  memantine (NAMENDA XR) 21 MG CP24 24 hr capsule Take 21 mg by mouth daily.  04/13/15  Yes [provider]  Multiple Vitamins-Minerals (CENTRUM SILVER) tablet Take 1 tablet by mouth daily.     Yes [provider]  omeprazole (PRILOSEC) 40 MG capsule Take 40 mg by mouth daily. 10/08/17  Yes [provider]  QUEtiapine (SEROQUEL) 25 MG tablet Take 25 mg by mouth daily as needed for agitation. 05/24/20 10/14/20 Yes [provider]  sertraline (ZOLOFT) 50 MG tablet Take 50 mg by mouth daily.  03/11/15  Yes [provider]  simvastatin (ZOCOR) 40 MG tablet TAKE ONE TABLET BY MOUTH AT BEDTIME 09/09/19  Yes Gollan, Tollie Pizza, MD  nitroGLYCERIN (NITROSTAT) 0.4 MG SL tablet Place 1 tablet (0.4 mg total) under the tongue as directed. 10/23/16   Antonieta Iba, MD    Allergies Aspirin, Aspirin-dipyridamole er, Aspirin-dipyridamole er, Ciprofloxacin, Contrast media [iodinated diagnostic agents], Morphine, Other, and Venlafaxine  Family History  Problem Relation Age of Onset  . Diabetes Neg Hx   . Coronary artery disease Neg Hx     Social History Social History   Tobacco Use  . Smoking status: Never Smoker  . Smokeless tobacco: Never Used  Substance Use Topics  . Alcohol use: No  . Drug use: No    Review of Systems  Constitutional: No  fever/chills Eyes: No visual changes. ENT: No sore throat. Cardiovascular: Positive for chest pain. Respiratory: Denies shortness of breath. Gastrointestinal: No abdominal pain.  No nausea, no vomiting.  No diarrhea.  No constipation. Genitourinary: Negative for dysuria. Musculoskeletal: Negative for back pain. Skin: Negative for rash. Neurological: Negative for headaches, focal weakness or numbness.   ____________________________________________   PHYSICAL EXAM:  VITAL SIGNS: ED Triage Vitals  Enc Vitals Group     BP 10/14/20 0015 96/83     Pulse Rate 10/14/20 0015 (!) 121     Resp 10/14/20 0015 19     Temp 10/14/20 0015 98.2 F (36.8 C)     Temp Source 10/14/20 0015 Oral     SpO2 10/14/20 0015 99 %     Weight 10/14/20 0017 150 lb (68 kg)  Height 10/14/20 0017 5\' 8"  (1.727 m)     Head Circumference --      Peak Flow --      Pain Score 10/14/20 0019 3     Pain Loc --      Pain Edu? --      Excl. in GC? --     Constitutional: Alert and oriented.  Elderly appearing and in mild acute distress. Eyes: Conjunctivae are normal. PERRL. EOMI. Head: Atraumatic. Nose: No congestion/rhinnorhea. Mouth/Throat: Mucous membranes are mildly dry. Neck: No stridor.   Cardiovascular: Tachycardic rate, irregular rhythm. Grossly normal heart sounds.  Good peripheral circulation. Respiratory: Normal respiratory effort.  No retractions. Lungs CTAB. Gastrointestinal: Soft and nontender. No distention. No abdominal bruits. No CVA tenderness. Musculoskeletal: No lower extremity tenderness nor edema.  No joint effusions. Neurologic: Alert and oriented to person.  Normal speech and language. No gross focal neurologic deficits are appreciated.  Skin:  Skin is warm, dry and intact. No rash noted. Psychiatric: Mood and affect are normal. Speech and behavior are normal.  ____________________________________________   LABS (all labs ordered are listed, but only abnormal results are  displayed)  Labs Reviewed  CBC WITH DIFFERENTIAL/PLATELET - Abnormal; Notable for the following components:      Result Value   Hemoglobin 12.8 (*)    RDW 17.8 (*)    Platelets 73 (*)    All other components within normal limits  COMPREHENSIVE METABOLIC PANEL - Abnormal; Notable for the following components:   Glucose, Bld 128 (*)    BUN 27 (*)    Creatinine, Ser 1.35 (*)    Calcium 8.7 (*)    Total Protein 6.0 (*)    GFR, Estimated 51 (*)    All other components within normal limits  BRAIN NATRIURETIC PEPTIDE - Abnormal; Notable for the following components:   B Natriuretic Peptide 411.8 (*)    All other components within normal limits  TROPONIN I (HIGH SENSITIVITY) - Abnormal; Notable for the following components:   Troponin I (High Sensitivity) 65 (*)    All other components within normal limits  RESP PANEL BY RT-PCR (FLU A&B, COVID) ARPGX2  LIPASE, BLOOD  PROTIME-INR  CBC  TROPONIN I (HIGH SENSITIVITY)   ____________________________________________  EKG  ED ECG REPORT I, Joleigh Mineau J, the attending physician, personally viewed and interpreted this ECG.   Date: 10/14/2020  EKG Time: 0010  Rate: 131  Rhythm: atrial fibrillation, rate 131  Axis: LAD  Intervals:nonspecific intraventricular conduction delay  ST&T Change: Nonspecific  ____________________________________________  RADIOLOGY I, Damyah Gugel J, personally viewed and evaluated these images (plain radiographs) as part of my medical decision making, as well as reviewing the written report by the radiologist.  ED MD interpretation: Mild pulmonary vascular congestion  Official radiology report(s): DG Chest Port 1 View  Result Date: 10/14/2020 CLINICAL DATA:  Chest pain. EXAM: PORTABLE CHEST 1 VIEW COMPARISON:  August 19, 2015 FINDINGS: Multiple sternal wires and vascular clips are noted. Chronic appearing increased lung markings are seen. There is mild prominence of the perihilar pulmonary vasculature. There is  no evidence of acute infiltrate, pleural effusion or pneumothorax. The cardiac silhouette is mildly enlarged. Degenerative changes seen throughout the thoracic spine. IMPRESSION: 1. Evidence of prior median sternotomy/CABG. 2. Cardiomegaly with mild pulmonary vascular congestion. Electronically Signed   By: August 21, 2015 M.D.   On: 10/14/2020 00:53    ____________________________________________   PROCEDURES  Procedure(s) performed (including Critical Care):  .1-3 Lead EKG Interpretation Performed by: 12/14/2020, MD  Authorized by: Irean HongSung, Vici Novick J, MD     Interpretation: abnormal     ECG rate:  131   ECG rate assessment: tachycardic     Rhythm: atrial fibrillation     Ectopy: none     Conduction: normal   Comments:     Patient placed on cardiac monitor to evaluate for arrhythmias    CRITICAL CARE Performed by: Irean HongSUNG,Malyia Moro J   Total critical care time: 45 minutes  Critical care time was exclusive of separately billable procedures and treating other patients.  Critical care was necessary to treat or prevent imminent or life-threatening deterioration.  Critical care was time spent personally by me on the following activities: development of treatment plan with patient and/or surrogate as well as nursing, discussions with consultants, evaluation of patient's response to treatment, examination of patient, obtaining history from patient or surrogate, ordering and performing treatments and interventions, ordering and review of laboratory studies, ordering and review of radiographic studies, pulse oximetry and re-evaluation of patient's condition.  ____________________________________________   INITIAL IMPRESSION / ASSESSMENT AND PLAN / ED COURSE  As part of my medical decision making, I reviewed the following data within the electronic MEDICAL RECORD NUMBER History obtained from family, Nursing notes reviewed and incorporated, Labs reviewed, EKG interpreted, Old chart reviewed,  Radiograph reviewed, Discussed with admitting physician and Notes from prior ED visits     87 year old male presenting with chest pain, noted to be in atrial fibrillation with rapid ventricular rate. Differential diagnosis includes, but is not limited to, ACS, aortic dissection, pulmonary embolism, cardiac tamponade, pneumothorax, pneumonia, pericarditis, myocarditis, GI-related causes including esophagitis/gastritis, and musculoskeletal chest wall pain.    Patient given 3 sublingual nitroglycerin, 5 mg IV metoprolol, 4 baby aspirin, 500 mL IV fluids prior to arrival.  Pain-free on arrival.  Will initiate small IV Cardizem bolus for rate control.  Anticipate hospitalization.  Clinical Course as of 10/14/20 0228  Fri Oct 14, 2020  0129 Heart rate 56 after IV diltiazem.  Wife at bedside.  States patient has never had atrial fibrillation.  Will discuss with hospitalist services for admission. [JS]    Clinical Course User Index [JS] Irean HongSung, Afnan Cadiente J, MD     ____________________________________________   FINAL CLINICAL IMPRESSION(S) / ED DIAGNOSES  Final diagnoses:  Unstable angina pectoris [facility](HCC)  Atrial fibrillation with rapid ventricular response [facility](HCC)     ED Discharge Orders    None      *Please note:  Farris HasJames H Donlan was evaluated in Emergency Department on 10/14/2020 for the symptoms described in the history of present illness. He was evaluated in the context of the global COVID-19 pandemic, which necessitated consideration that the patient might be at risk for infection with the SARS-CoV-2 virus that causes COVID-19. Institutional protocols and algorithms that pertain to the evaluation of patients at risk for COVID-19 are in a state of rapid change based on information released by regulatory bodies including the CDC and federal and state organizations. These policies and algorithms were followed during the patient's care in the ED.  Some ED evaluations and interventions may be delayed as a  result of limited staffing during and the pandemic.*   Note:  This document was prepared using Dragon voice recognition software and may include unintentional dictation errors.   Irean HongSung, Brently Voorhis J, MD 10/14/20 [phone]

## 2020-10-14 NOTE — Progress Notes (Signed)
Critical lab received - troponin 593.  Secure text sent to Manuela Schwartz.

## 2020-10-14 NOTE — Progress Notes (Signed)
   10/14/20 2200  Assess: MEWS Score  BP (!) 156/93  ECG Heart Rate (!) 110  Resp (!) 21  Assess: MEWS Score  MEWS Temp 0  MEWS Systolic 0  MEWS Pulse 1  MEWS RR 1  MEWS LOC 0  MEWS Score 2  MEWS Score Color Yellow  Assess: if the MEWS score is Yellow or Red  Were vital signs taken at a resting state? No  Focused Assessment No change from prior assessment  Early Detection of Sepsis Score *See Row Information* Low  MEWS guidelines implemented *See Row Information* No, altered LOC is baseline (Py os [id number])  Treat  MEWS Interventions Administered prn meds/treatments  Pain Scale 0-10  Pain Score 0  Escalate  MEWS: Escalate Yellow: discuss with charge nurse/RN and consider discussing with provider and RRT  Notify: Charge Nurse/RN  Name of Charge Nurse/RN Occupational psychologist, RN  Date Charge Nurse/RN Notified 10/14/20  Time Charge Nurse/RN Notified 2248  Notify: Provider  Provider Name/Title Jon Billings  Date Provider Notified 10/14/20  Time Provider Notified 2230  Notification Type Page  Notification Reason Other (Comment) (agitation, HR)  Provider response See new orders  Date of Provider Response 10/14/20  Time of Provider Response 2245

## 2020-10-14 NOTE — ED Notes (Signed)
Chief Complaint  Patient presents with  . Chest Pain    Pt presenting from home via EMS with reports of chest pain that woke him up out of sleep. Wife gave pt 3 SLG nitro with no relief. EMS EKG noted pt to be in rapid afib, HR 160s. Given 324 ASA, 5mg  metoprolol and NS en route. HR dropped to 140s. Pt has cardiac hx but denies hx of afib   Pt presenting with the above complaint. Pt endorses slight chest pain at this time. Hx dementia. Oriented to person only. Wife on the way to hospital per EMS.

## 2020-10-14 NOTE — ED Notes (Addendum)
EKG post cardizem given to Alex Frame MD. Pt's wife at bedside

## 2020-10-14 NOTE — Consult Note (Signed)
Cardiology Consultation:   Patient ID: Alex [name]; [id number]; [birth year]   Admit date: 10/14/2020 Date of Consult: 10/14/2020  Primary Care Provider: Danella Penton, MD Primary Cardiologist: Mariah Milling Primary Electrophysiologist:  None   Patient Profile:   Alex [name] is a 87 y.o. male with a hx of CAD status post CABG in 1996, CVA in 07/2014, epidural hematoma, subdural hematoma, hepatitis C with cirrhosis, esophageal varices, dementia, and hypothyroidism who is being seen today for the evaluation of NSTEMI at the request of Dr. Arville Care.  History of Present Illness:   [name] most recently underwent LHC in 03/2009 which showed patent grafts with 50% stenosis in the graft to the RCA with normal LV systolic function.  Most recent ischemic testing via nuclear stress testing in 08/2015 showed no significant ischemia.  Most recent echo in 12/2019, for murmur, showed an EF of 50-55%, no RWMA, moderate LVH, Gr2DD, elevated RVSP, mild to moderate MR, mild AI, and an estimated right atrial pressure of 15 mmHg.   He suffered an epidural hematoma in 04/2020 followed by a nontraumatic subdural hematoma in 05/2020 status post bur holes and hematoma evacuation with residual dysphagia and dysarthria.  He was readmitted to the hospital in 05/2020 with syncope felt to be related to transient hypotension.  He was seen in the ED last month for a fall, presumably mechanical.  Imaging showed a small residual subdural bleed anterior to the right frontal lobe that was decreased since prior CT in 05/2020.  He presented to [facility] on 10/14/2020 with chest pain that woke him out of sleep around midnight that was described as pressure.  There was associated diaphoresis, otherwise no reported symptoms.  Unable to get any further details of his pain as he did not communicate further details to his wife. His wife is uncertain how long the pain persisted. She gave him 3 sublingual nitroglycerin without relief.  EMS was  contacted with notes indicating he was in A. fib with RVR.  Review of EMS rhythm strips are not currently available for review.  In the field he was given ASA 324 mg as well as 5 mg of IV metoprolol and IV fluids.  With this his heart remained in the 140s bpm.  Upon arrival to [facility] ER vital signs were stable.    Labs: Initial high-sensitivity troponin 65, delta troponin 593, currently trended to 3831, BNP 411, BUN 27, serum creatinine 1.35, Hgb 12.8, COVID-negative  Imaging: Cardiomegaly with mild pulmonary vascular congestion with prior median sternotomy/CABG noted  In the ED he received IV fluids, IV Lasix 20 mg, IV diltiazem 10 mg, and was placed on a heparin drip.  Currently, chest pain free.  History obtained from his wife.  She indicates she has HCPOA given his underlying dementia.     Past Medical History:  Diagnosis Date  . Arthritis   . CAD (coronary artery disease)    post CABG; myoview 2009 neg  . Carotid artery disease (HCC)    Mild  . Cirrhosis of liver (HCC)   . Dementia (HCC)   . GERD (gastroesophageal reflux disease)   . Hepatitis C    Inactive  . Hyperlipidemia   . Hypertension   . Idiopathic thrombocytopenia (HCC)   . Memory loss   . Myocardial infarction [facility]) 1996   [place]  . RBBB (right bundle branch block)   . Right bundle branch block (RBBB)   . Right tibial fracture 2014  . Stroke [facility]) February  2016   [facility]  . Thrombocytopenia (HCC)   . TIA (transient ischemic attack)   . TIA (transient ischemic attack)     Past Surgical History:  Procedure Laterality Date  . CORONARY ARTERY BYPASS GRAFT    . EYE SURGERY Bilateral    Cataract Extraction  . FRACTURE SURGERY Right 1981   Rod in right femur  . JOINT REPLACEMENT    . LEG SURGERY  2014   right leg  . PARTIAL KNEE ARTHROPLASTY Right 06/14/2015   Procedure: UNICOMPARTMENTAL KNEE;  Surgeon: Christena Flake, MD;  Location: [facility] ORS;  Service: Orthopedics;  Laterality: Right;  . TIBIA FRACTURE  SURGERY Right 2014   Rod in tibia  . UPPER GASTROINTESTINAL ENDOSCOPY       Home Meds: Prior to Admission medications   Medication Sig Start Date End Date Taking? Authorizing Provider  aspirin 81 MG EC tablet Take 81 mg by mouth daily.   Yes [provider]  clopidogrel (PLAVIX) 75 MG tablet Take 1 tablet by mouth daily. 09/22/20  Yes [provider]  Cyanocobalamin (B-12) 1000 MCG TBCR Take 1,000 mcg by mouth daily. 06/05/20  Yes Esaw Grandchild A, DO  ezetimibe (ZETIA) 10 MG tablet Take 1 tablet (10 mg total) by mouth daily. 02/03/20 05/03/20 Yes Gollan, Tollie Pizza, MD  galantamine (RAZADYNE) 12 MG tablet Take 12 mg by mouth 2 (two) times daily.  02/26/13  Yes [provider]  irbesartan (AVAPRO) 300 MG tablet TAKE 1/2 TABLET EVERYDAY 07/14/20  Yes Gollan, Tollie Pizza, MD  levothyroxine (SYNTHROID, LEVOTHROID) 75 MCG tablet Take 75 mcg by mouth daily. 12/21/16  Yes [provider]  memantine (NAMENDA XR) 21 MG CP24 24 hr capsule Take 21 mg by mouth daily.  04/13/15  Yes [provider]  Multiple Vitamins-Minerals (CENTRUM SILVER) tablet Take 1 tablet by mouth daily.     Yes [provider]  omeprazole (PRILOSEC) 40 MG capsule Take 40 mg by mouth daily. 10/08/17  Yes [provider]  QUEtiapine (SEROQUEL) 25 MG tablet Take 25 mg by mouth daily as needed for agitation. 05/24/20 10/14/20 Yes [provider]  sertraline (ZOLOFT) 50 MG tablet Take 50 mg by mouth daily.  03/11/15  Yes [provider]  simvastatin (ZOCOR) 40 MG tablet TAKE ONE TABLET BY MOUTH AT BEDTIME 09/09/19  Yes Gollan, Tollie Pizza, MD  nitroGLYCERIN (NITROSTAT) 0.4 MG SL tablet Place 1 tablet (0.4 mg total) under the tongue as directed. 10/23/16   Antonieta Iba, MD    Inpatient Medications: Scheduled Meds: . aspirin EC  81 mg Oral Daily  . atorvastatin  80 mg Oral Daily  . clopidogrel  75 mg Oral Daily  . ezetimibe  10 mg Oral Daily  . furosemide  20 mg  Intravenous Q12H  . galantamine  12 mg Oral BID  . irbesartan  150 mg Oral Daily  . levothyroxine  75 mcg Oral Q0600  . memantine  21 mg Oral Daily  . multivitamin with minerals  1 tablet Oral Daily  . pantoprazole  40 mg Oral Daily  . sertraline  50 mg Oral Daily  . vitamin B-12  1,000 mcg Oral Daily   Continuous Infusions: . sodium chloride 100 mL/hr at 10/14/20 0534  . heparin 900 Units/hr (10/14/20 0741)   PRN Meds: acetaminophen **OR** acetaminophen, hydrALAZINE, HYDROmorphone (DILAUDID) injection, magnesium hydroxide, nitroGLYCERIN, ondansetron **OR** ondansetron (ZOFRAN) IV, QUEtiapine  Allergies:   Allergies  Allergen Reactions  . Aspirin Other (See Comments)    "  unknown"  . Aspirin-Dipyridamole Er Other (See Comments)    "unknown"  . Aspirin-Dipyridamole Er     aggrenox  . Ciprofloxacin Other (See Comments)    "burning sensation" Other reaction(s): OTHER  . Contrast Media [Iodinated Diagnostic Agents]     Burning when having mri contrast dye -   . Morphine Other (See Comments)    "unknown" Other reaction(s): OTHER  . Other Other (See Comments)    Other reaction(s): Unknown  Other reaction(s): OTHER Burning when having mri contrast dye -  Other reaction(s): OTHER   . Venlafaxine Other (See Comments)    "unknown" Other reaction(s): OTHER  AKA Effexor Other reaction(s): OTHER     Social History:   Social History   Socioeconomic History  . Marital status: Married    Spouse name: Not on file  . Number of children: Not on file  . Years of education: Not on file  . Highest education level: Not on file  Occupational History  . Occupation: Retired  Tobacco Use  . Smoking status: Never Smoker  . Smokeless tobacco: Never Used  Substance and Sexual Activity  . Alcohol use: No  . Drug use: No  . Sexual activity: Not on file  Other Topics Concern  . Not on file  Social History Narrative   Married   Social Determinants of Health   Financial Resource  Strain: Not on file  Food Insecurity: Not on file  Transportation Needs: Not on file  Physical Activity: Not on file  Stress: Not on file  Social Connections: Not on file  Intimate Partner Violence: Not on file     Family History:   Family History  Problem Relation Age of Onset  . Diabetes Neg Hx   . Coronary artery disease Neg Hx     ROS:  Review of Systems  Unable to perform ROS: Dementia      Physical Exam/Data:   Vitals:   10/14/20 0100 10/14/20 0130 10/14/20 0245 10/14/20 0842  BP: (!) 93/58 97/67 122/79 (!) 109/59  Pulse: 61 (!) 59 62 68  Resp: 18 16 16 12   Temp:   97.6 F (36.4 C) 98.9 F (37.2 C)  TempSrc:   Oral Oral  SpO2: 97% 98% 96% 95%  Weight:   73.1 kg   Height:        Intake/Output Summary (Last 24 hours) at 10/14/2020 0957 Last data filed at 10/14/2020 0600 Gross per 24 hour  Intake 971.64 ml  Output --  Net 971.64 ml   Filed Weights   10/14/20 0017 10/14/20 0245  Weight: 68 kg 73.1 kg   Body mass index is 24.51 kg/m.   Physical Exam: General: Well developed, well nourished, in no acute distress. Head: Normocephalic, atraumatic, sclera non-icteric, no xanthomas, nares without discharge.  Neck: Negative for carotid bruits. JVD not elevated. Lungs: Clear bilaterally to auscultation without wheezes, rales, or rhonchi. Breathing is unlabored. Heart: RRR with S1 S2. II/VI systolic murmur at the apex, no rubs, or gallops appreciated. Abdomen: Soft, non-tender, non-distended with normoactive bowel sounds. No hepatomegaly. No rebound/guarding. No obvious abdominal masses. Msk:  Strength and tone appear normal for age. Extremities: No clubbing or cyanosis. No edema. Distal pedal pulses are 2+ and equal bilaterally. Neuro: Alert and oriented to person. No facial asymmetry. No focal deficit. Moves all extremities spontaneously. Psych:  Responds to questions appropriately with a normal affect.   EKG:  The EKG was personally reviewed and demonstrates:  sinus bradycardia, 56 bpm, RBBB LVH with  early repolarization abnormality with subtle inferior ST elevation Telemetry:  Telemetry was personally reviewed and demonstrates: WAP, 60s bpm, no evidence of Afib  Weights: Filed Weights   10/14/20 0017 10/14/20 0245  Weight: 68 kg 73.1 kg    Relevant CV Studies:  2D echo 05/2020: 1. Left ventricular ejection fraction, by estimation, is 50 to 55%. The  left ventricle has low normal function. The left ventricle has no regional  wall motion abnormalities. The left ventricular internal cavity size was  mildly dilated. There is mild  concentric left ventricular hypertrophy. Left ventricular diastolic  parameters are consistent with Grade I diastolic dysfunction (impaired  relaxation).  2. Right ventricular systolic function is normal. The right ventricular  size is normal. There is mildly elevated pulmonary artery systolic  pressure.  3. Left atrial size was mildly dilated.  4. The mitral valve is grossly normal. Mild to moderate mitral valve  regurgitation.  5. The aortic valve is normal in structure. Aortic valve regurgitation is  not visualized. No aortic stenosis is present. __________  2D echo 12/2019: 1. Left ventricular ejection fraction, by estimation, is 50 to 55%. The  left ventricle has low normal function. The left ventricle has no regional  wall motion abnormalities. There is moderate left ventricular hypertrophy.  Left ventricular diastolic  parameters are consistent with Grade II diastolic dysfunction  (pseudonormalization).  2. Right ventricular systolic function is normal. The right ventricular  size is normal. There is moderately elevated pulmonary artery systolic  pressure.  3. The mitral valve is grossly normal. Mild to moderate mitral valve  regurgitation.  4. The aortic valve is tricuspid. Aortic valve regurgitation is mild.  5. The inferior vena cava is dilated in size with <50% respiratory  variability,  suggesting right atrial pressure of 15 mmHg. __________  Nuclear stress test 08/2015: Pharmacological myocardial perfusion imaging study with no significant  ischemia Basal to apical inferoseptal wall motion abnormality,likely secondary to conduction abnormality  EF estimated at 55% No EKG changes concerning for ischemia at peak stress or in recovery. Baseline EKG with resting ST and T wave abnormality Low risk scan __________  Nuclear stress test 04/2013: No significant ischemia, attenuation artifact noted, no significant wall motion abnormality, EF 57%, equivocal EKG changes, low risk scan.    Laboratory Data:  Chemistry Recent Labs  Lab 10/14/20 0015  NA 139  K 4.0  CL 104  CO2 27  GLUCOSE 128*  BUN 27*  CREATININE 1.35*  CALCIUM 8.7*  GFRNONAA 51*  ANIONGAP 8    Recent Labs  Lab 10/14/20 0015  PROT 6.0*  ALBUMIN 4.1  AST 21  ALT 20  ALKPHOS 71  BILITOT 1.1   Hematology Recent Labs  Lab 10/14/20 0015 10/14/20 0505  WBC 5.6 5.6  RBC 4.28 3.86*  HGB 12.8* 11.7*  HCT 39.0 35.2*  MCV 91.1 91.2  MCH 29.9 30.3  MCHC 32.8 33.2  RDW 17.8* 18.0*  PLT 73* 73*   Cardiac EnzymesNo results for input(s): TROPONINI in the last 168 hours. No results for input(s): TROPIPOC in the last 168 hours.  BNP Recent Labs  Lab 10/14/20 0015  BNP 411.8*    DDimer No results for input(s): DDIMER in the last 168 hours.  Radiology/Studies:  [facility] Chest Port 1 View  Result Date: 10/14/2020 IMPRESSION: 1. Evidence of prior median sternotomy/CABG. 2. Cardiomegaly with mild pulmonary vascular congestion. Electronically Signed   By: Aram Candela M.D.   On: 10/14/2020 00:53    Assessment and Plan:  1. CAD s/p CABG with NSTEMI: -Currently, chest pain free -High-sensitivity troponin has trended to 3831, continue to cycle until peak -PTA ASA and Plavix -Heparin drip -Echo -NPO -His wife has HCPOA and would like to proceed with LHC, we will attempt to do this later today  based on schedule  -Check lipid panel and A1c for further risk stratification -Risks and benefits of cardiac catheterization have been discussed with the patient, and his wife who is the HCPOA, including risks of bleeding, bruising, infection, kidney damage, stroke, heart attack, and death. The patient understands these risks and is willing to proceed with the procedure. All questions have been answered and concerns listened to  2. WAP: -No evidence of Afib on EKG on telemetry  -Attempt to get EMS runs sheet -Defer full dose anticoagulation without documented evidence of A. Fib -Look to add beta blocker post cath   3.  HFpEF: -IV Lasix ordered by primary service -Check echo  4. HTN: -Well-controlled to slightly soft -Recommend holding irbesartan for systolic blood pressure less than 100  5. HLD: -LDL 56 in 07/2020 -Continue PTA atorvastatin and Zetia  6.  History of CVA along with history of epidural and subdural hematoma: -No new deficits  7. Dementia: -Wife is making medical decisions with HCPOA   For questions or updates, please contact [facility] Please consult [email or web address] for contact info under Cardiology/STEMI.   Signed, Eula Listen, PA-C [facility] Pager: [phone] 10/14/2020, 9:57 AM

## 2020-10-15 DIAGNOSIS — R1310 Dysphagia, unspecified: Secondary | ICD-10-CM | POA: Diagnosis present

## 2020-10-15 DIAGNOSIS — Z951 Presence of aortocoronary bypass graft: Secondary | ICD-10-CM | POA: Diagnosis not present

## 2020-10-15 DIAGNOSIS — E039 Hypothyroidism, unspecified: Secondary | ICD-10-CM | POA: Diagnosis present

## 2020-10-15 DIAGNOSIS — I480 Paroxysmal atrial fibrillation: Secondary | ICD-10-CM | POA: Diagnosis present

## 2020-10-15 DIAGNOSIS — I5033 Acute on chronic diastolic (congestive) heart failure: Secondary | ICD-10-CM | POA: Diagnosis present

## 2020-10-15 DIAGNOSIS — K219 Gastro-esophageal reflux disease without esophagitis: Secondary | ICD-10-CM | POA: Diagnosis present

## 2020-10-15 DIAGNOSIS — I4891 Unspecified atrial fibrillation: Secondary | ICD-10-CM | POA: Diagnosis not present

## 2020-10-15 DIAGNOSIS — Z20822 Contact with and (suspected) exposure to covid-19: Secondary | ICD-10-CM | POA: Diagnosis present

## 2020-10-15 DIAGNOSIS — Z515 Encounter for palliative care: Secondary | ICD-10-CM | POA: Diagnosis not present

## 2020-10-15 DIAGNOSIS — K7469 Other cirrhosis of liver: Secondary | ICD-10-CM | POA: Diagnosis present

## 2020-10-15 DIAGNOSIS — I214 Non-ST elevation (NSTEMI) myocardial infarction: Principal | ICD-10-CM

## 2020-10-15 DIAGNOSIS — I69891 Dysphagia following other cerebrovascular disease: Secondary | ICD-10-CM | POA: Diagnosis not present

## 2020-10-15 DIAGNOSIS — I69822 Dysarthria following other cerebrovascular disease: Secondary | ICD-10-CM | POA: Diagnosis not present

## 2020-10-15 DIAGNOSIS — I130 Hypertensive heart and chronic kidney disease with heart failure and stage 1 through stage 4 chronic kidney disease, or unspecified chronic kidney disease: Secondary | ICD-10-CM | POA: Diagnosis present

## 2020-10-15 DIAGNOSIS — D693 Immune thrombocytopenic purpura: Secondary | ICD-10-CM | POA: Diagnosis present

## 2020-10-15 DIAGNOSIS — R079 Chest pain, unspecified: Secondary | ICD-10-CM | POA: Diagnosis not present

## 2020-10-15 DIAGNOSIS — R410 Disorientation, unspecified: Secondary | ICD-10-CM | POA: Diagnosis not present

## 2020-10-15 DIAGNOSIS — I200 Unstable angina: Secondary | ICD-10-CM | POA: Diagnosis present

## 2020-10-15 DIAGNOSIS — F0280 Dementia in other diseases classified elsewhere without behavioral disturbance: Secondary | ICD-10-CM | POA: Diagnosis present

## 2020-10-15 DIAGNOSIS — E785 Hyperlipidemia, unspecified: Secondary | ICD-10-CM | POA: Diagnosis present

## 2020-10-15 DIAGNOSIS — I25110 Atherosclerotic heart disease of native coronary artery with unstable angina pectoris: Secondary | ICD-10-CM | POA: Diagnosis present

## 2020-10-15 DIAGNOSIS — N1831 Chronic kidney disease, stage 3a: Secondary | ICD-10-CM

## 2020-10-15 DIAGNOSIS — I4892 Unspecified atrial flutter: Secondary | ICD-10-CM | POA: Diagnosis not present

## 2020-10-15 DIAGNOSIS — G309 Alzheimer's disease, unspecified: Secondary | ICD-10-CM | POA: Diagnosis present

## 2020-10-15 DIAGNOSIS — Z96651 Presence of right artificial knee joint: Secondary | ICD-10-CM | POA: Diagnosis present

## 2020-10-15 DIAGNOSIS — Z7189 Other specified counseling: Secondary | ICD-10-CM | POA: Diagnosis not present

## 2020-10-15 DIAGNOSIS — Z66 Do not resuscitate: Secondary | ICD-10-CM | POA: Diagnosis present

## 2020-10-15 DIAGNOSIS — F05 Delirium due to known physiological condition: Secondary | ICD-10-CM | POA: Diagnosis present

## 2020-10-15 DIAGNOSIS — R001 Bradycardia, unspecified: Secondary | ICD-10-CM | POA: Diagnosis present

## 2020-10-15 LAB — CBC
HCT: 39.5 % (ref 39.0–52.0)
Hemoglobin: 13.5 g/dL (ref 13.0–17.0)
MCH: 30 pg (ref 26.0–34.0)
MCHC: 34.2 g/dL (ref 30.0–36.0)
MCV: 87.8 fL (ref 80.0–100.0)
Platelets: 73 10*3/uL — ABNORMAL LOW (ref 150–400)
RBC: 4.5 MIL/uL (ref 4.22–5.81)
RDW: 17.7 % — ABNORMAL HIGH (ref 11.5–15.5)
WBC: 6.7 10*3/uL (ref 4.0–10.5)
nRBC: 0 % (ref 0.0–0.2)

## 2020-10-15 LAB — BASIC METABOLIC PANEL
Anion gap: 9 (ref 5–15)
BUN: 24 mg/dL — ABNORMAL HIGH (ref 8–23)
CO2: 27 mmol/L (ref 22–32)
Calcium: 9 mg/dL (ref 8.9–10.3)
Chloride: 102 mmol/L (ref 98–111)
Creatinine, Ser: 1.08 mg/dL (ref 0.61–1.24)
GFR, Estimated: >60 mL/min (ref >60)
Glucose, Bld: 96 mg/dL (ref 70–99)
Potassium: 3.8 mmol/L (ref 3.5–5.1)
Sodium: 138 mmol/L (ref 135–145)

## 2020-10-15 LAB — HEPARIN LEVEL (UNFRACTIONATED)
Heparin Unfractionated: 0.23 IU/mL — ABNORMAL LOW (ref 0.30–0.70)
Heparin Unfractionated: 0.52 IU/mL (ref 0.30–0.70)
Heparin Unfractionated: 0.67 IU/mL (ref 0.30–0.70)

## 2020-10-15 LAB — TROPONIN I (HIGH SENSITIVITY): Troponin I (High Sensitivity): 4627 ng/L (ref <18)

## 2020-10-15 LAB — ECHOCARDIOGRAM COMPLETE
AR max vel: 2.46 cm2
AV Area VTI: 2.3 cm2
AV Area mean vel: 2.19 cm2
AV Mean grad: 4 mmHg
AV Peak grad: 7.7 mmHg
Ao pk vel: 1.39 m/s
Area-P 1/2: 5.97 cm2
Height: 68 in
S' Lateral: 3.58 cm
Weight: 2579.2 oz

## 2020-10-15 LAB — MAGNESIUM: Magnesium: 2.1 mg/dL (ref 1.7–2.4)

## 2020-10-15 MED ORDER — QUETIAPINE FUMARATE 25 MG PO TABS
50.0000 mg | ORAL_TABLET | Freq: Every day | ORAL | Status: DC
  Administered 2020-10-15 – 2020-10-16 (×2): 50 mg via ORAL
  Filled 2020-10-15 (×2): qty 2

## 2020-10-15 MED ORDER — HEPARIN BOLUS VIA INFUSION
1000.0000 [IU] | INTRAVENOUS | Status: AC
  Administered 2020-10-15: 1000 [IU] via INTRAVENOUS
  Filled 2020-10-15: qty 1000

## 2020-10-15 MED ORDER — HALOPERIDOL LACTATE 5 MG/ML IJ SOLN
1.0000 mg | Freq: Four times a day (QID) | INTRAMUSCULAR | Status: DC | PRN
  Administered 2020-10-15 – 2020-10-17 (×2): 1 mg via INTRAVENOUS
  Filled 2020-10-15 (×4): qty 1

## 2020-10-15 MED ORDER — DILTIAZEM HCL 25 MG/5ML IV SOLN
INTRAVENOUS | Status: AC
  Administered 2020-10-15: 5 mg via INTRAVENOUS
  Filled 2020-10-15: qty 5

## 2020-10-15 NOTE — Progress Notes (Addendum)
Progress Note  Patient Name: Alex [name] Date of Encounter: 10/15/2020  Primary Cardiologist: Mariah Milling  Subjective   Unable to complete LHC 5/6 secondary to patient's underlying dementia and inability to follow commands. In this setting, plan was made for medical management. [place]ded to [address]. Echo is pending. He remains on heparin gtt. Vitals stable. Daughter at bedside.   Inpatient Medications    Scheduled Meds: . aspirin EC  81 mg Oral Daily  . atorvastatin  80 mg Oral Daily  . clopidogrel  75 mg Oral Daily  . ezetimibe  10 mg Oral Daily  . furosemide  20 mg Intravenous Q12H  . galantamine  12 mg Oral BID  . irbesartan  150 mg Oral Daily  . levothyroxine  75 mcg Oral Q0600  . memantine  21 mg Oral Daily  . multivitamin with minerals  1 tablet Oral Daily  . pantoprazole  40 mg Oral Daily  . sertraline  50 mg Oral Daily  . vitamin B-12  1,000 mcg Oral Daily   Continuous Infusions: . heparin 1,050 Units/hr (10/15/20 0705)   PRN Meds: acetaminophen **OR** acetaminophen, hydrALAZINE, HYDROmorphone (DILAUDID) injection, magnesium hydroxide, nitroGLYCERIN, ondansetron **OR** ondansetron (ZOFRAN) IV, QUEtiapine   Vital Signs    Vitals:   10/14/20 2200 10/15/20 0000 10/15/20 0154 10/15/20 0400  BP: (!) 156/93 105/84  107/67  Pulse:  64  (!) 55  Resp: (!) 21 16  14   Temp:  98.5 F (36.9 C)  (!) 97.4 F (36.3 C)  TempSrc:  Axillary  Axillary  SpO2:  95%  93%  Weight:   69.6 kg   Height:        Intake/Output Summary (Last 24 hours) at 10/15/2020 0734 Last data filed at 10/15/2020 12/15/2020 Gross per 24 hour  Intake 490.28 ml  Output 1900 ml  Net -1409.72 ml   Filed Weights   10/14/20 0017 10/14/20 0245 10/15/20 0154  Weight: 68 kg 73.1 kg 69.6 kg    Telemetry    Sinus with heart rates in the upper 50s to 60s bpm currently, episode of MAT and possible Afib overnight - Personally Reviewed  ECG    No new tracings - Personally  Reviewed  Physical Exam   GEN: No acute distress.   Neck: No JVD. Cardiac: RRR, II/VI systolic murmur at the apex, no rubs, or gallops.  Respiratory: Clear to auscultation bilaterally.  GI: Soft, nontender, non-distended.   MS: No edema; No deformity. Neuro:  Somnolent.  Psych: Somnolent.  Labs    Chemistry Recent Labs  Lab 10/14/20 0015 10/15/20 0505  NA 139 138  K 4.0 3.8  CL 104 102  CO2 27 27  GLUCOSE 128* 96  BUN 27* 24*  CREATININE 1.35* 1.08  CALCIUM 8.7* 9.0  PROT 6.0*  --   ALBUMIN 4.1  --   AST 21  --   ALT 20  --   ALKPHOS 71  --   BILITOT 1.1  --   GFRNONAA 51* >60  ANIONGAP 8 9     Hematology Recent Labs  Lab 10/14/20 0015 10/14/20 0505 10/15/20 0505  WBC 5.6 5.6 6.7  RBC 4.28 3.86* 4.50  HGB 12.8* 11.7* 13.5  HCT 39.0 35.2* 39.5  MCV 91.1 91.2 87.8  MCH 29.9 30.3 30.0  MCHC 32.8 33.2 34.2  RDW 17.8* 18.0* 17.7*  PLT 73* 73* 73*    Cardiac EnzymesNo results for input(s): TROPONINI in the last 168 hours. No results for input(s):  TROPIPOC in the last 168 hours.   BNP Recent Labs  Lab 10/14/20 0015  BNP 411.8*     DDimer No results for input(s): DDIMER in the last 168 hours.   Radiology    DG Chest Port 1 View  Result Date: 10/14/2020 IMPRESSION: 1. Evidence of prior median sternotomy/CABG. 2. Cardiomegaly with mild pulmonary vascular congestion. Electronically Signed   By: Aram Candela M.D.   On: 10/14/2020 00:53    Cardiac Studies   2D echo 10/2020: Pending __________  2D echo 05/2020: 1. Left ventricular ejection fraction, by estimation, is 50 to 55%. The  left ventricle has low normal function. The left ventricle has no regional  wall motion abnormalities. The left ventricular internal cavity size was  mildly dilated. There is mild  concentric left ventricular hypertrophy. Left ventricular diastolic  parameters are consistent with Grade I diastolic dysfunction (impaired  relaxation).  2. Right ventricular  systolic function is normal. The right ventricular  size is normal. There is mildly elevated pulmonary artery systolic  pressure.  3. Left atrial size was mildly dilated.  4. The mitral valve is grossly normal. Mild to moderate mitral valve  regurgitation.  5. The aortic valve is normal in structure. Aortic valve regurgitation is  not visualized. No aortic stenosis is present. __________  2D echo 12/2019: 1. Left ventricular ejection fraction, by estimation, is 50 to 55%. The  left ventricle has low normal function. The left ventricle has no regional  wall motion abnormalities. There is moderate left ventricular hypertrophy.  Left ventricular diastolic  parameters are consistent with Grade II diastolic dysfunction  (pseudonormalization).  2. Right ventricular systolic function is normal. The right ventricular  size is normal. There is moderately elevated pulmonary artery systolic  pressure.  3. The mitral valve is grossly normal. Mild to moderate mitral valve  regurgitation.  4. The aortic valve is tricuspid. Aortic valve regurgitation is mild.  5. The inferior vena cava is dilated in size with <50% respiratory  variability, suggesting right atrial pressure of 15 mmHg. __________  Nuclear stress test 08/2015: Pharmacological myocardial perfusion imaging study with no significant ischemia Basal to apical inferoseptal wall motion abnormality,likely secondary to conduction abnormality  EF estimated at 55% No EKG changes concerning for ischemia at peak stress or in recovery. Baseline EKG with resting ST and T wave abnormality Low risk scan __________  Nuclear stress test 04/2013: No significant ischemia, attenuation artifact noted, no significant wall motion abnormality, EF 57%, equivocal EKG changes, low risk scan.  Patient Profile     87 y.o. male with history of CAD status post CABG in 1996, CVA in 07/2014, epidural hematoma, subdural hematoma, hepatitis C with  cirrhosis, esophageal varices, dementia, and hypothyroidism who is being seen today for the evaluation of NSTEMI at the request of Dr. Arville Care.  Assessment & Plan    1. CAD s/p CABG with NSTEMI: -High-sensitivity troponin has trended to 12[address], continue to cycle until peak -Wife wanted to pursue LHC, though this was not able to be completed as the patient was unable to follow commands during the procedure, therefore the procedure was aborted for patient safety -He will be medically managed -PTA ASA and Plavix -Heparin drip to be continued for a total of 48 hours (0730 on 5/8) -Echo pending -Code status should be addressed with recommendation to consider palliative care consult by primary service to address goals of care   2. WAP/MAT/possible Afib: -Currently in sinus rhythm -Attempt to get EMS runs sheet -Cannot  exclude brief episode of Afib overnight, will defer OAC at this time given recent subdural hematoma requiring evacuation, liver cirrhosis, and thrombocytopenia, as well as need for antiplatelet therapy given medical management of NSTEMI -Unable to add beta blocker with underlying bradycardic rates  3.  HFpEF: -IV Lasix ordered by primary service -Echo pending  4. HTN: -Well-controlled to slightly soft -Recommend holding irbesartan for systolic blood pressure less than 100  5. HLD: -LDL 58 this admission -Continue PTA atorvastatin and Zetia  6.  History of CVA along with history of epidural and subdural hematoma: -No new deficits  7. Dementia: -Wife is making medical decisions with HCPOA   For questions or updates, please contact [facility] Please consult [email or web address] for contact info under Cardiology/STEMI.    Signed, Eula Listen, PA-C [facility] Pager: [phone] 10/15/2020, 7:34 AM   Attending Note:   The patient was seen and examined.  Agree with assessment and plan as noted above.  Changes made to the above note as needed.  Patient seen and  independently examined with Eula Listen, PA .   We discussed all aspects of the encounter. I agree with the assessment and plan as stated above.  1.   NSTEMI:   Echo shows EF 50--55%. Inf. Basal hypokinesis. Mild MR ,  Overall , he appears to be doing well.   No angina  Ate well this am  Heparin to be DC'd tomorrow   He will need to work with PT or nurses to ensure that he can ambulate ok prior to Dc  No further cardiac studies anticipated.   2.  CAD - s/p CABG  3.  SVT ? Afib:  Currently in NSR He has a hx of subdural hematoma.    I do not think he is a candidate for OAC    I have spent a total of 40 minutes with patient reviewing hospital  notes , telemetry, EKGs, labs and examining patient as well as establishing an assessment and plan that was discussed with the patient. > 50% of time was spent in direct patient care.    Vesta Mixer, Montez Hageman., MD, [facility] 10/15/2020, 9:28 AM [address]  [address] Office [phone] Pager [phone]

## 2020-10-15 NOTE — Progress Notes (Signed)
PROGRESS NOTE    Alex [name]  ZOX:[id number] DOB: 06-Oct-1932 DOA: 10/14/2020 PCP: Danella PentonMiller, Mark F, MD   Chief complaint.  Chest pain Brief Narrative:  Alex JaffeJames H Swainis a 87 y.o.Caucasian malewith medical history significant forcoronary artery disease status post CABG, carotid artery disease, liver cirrhosis, GERD, dementia, hypertension, dyslipidemia, CVA/TIA, who presented to the ER with acute onset of chest pain that woke him up from sleep.  Upon arriving the emergency room, EKG showed atrial fibrillation with rapid ventricular response, peak troponin 3831.  Patient is seen by cardiology, cardiac cath was deferred due to multiple medical problems and a poor prognosis.   Assessment & Plan:   Active Problems:   Essential hypertension   Delirium with dementia   Chest pain   NSTEMI (non-ST elevated myocardial infarction) (HCC)   Paroxysmal atrial fibrillation with rapid ventricular response (HCC)   Acute on chronic diastolic CHF (congestive heart failure) (HCC)   Chronic kidney disease, stage 3a (HCC)   Thrombocytopenia (HCC)  #1.  Non-ST elevation myocardial infarction. Acute on chronic diastolic congestive heart failure Paroxysmal atrial fibrillation with rapid ventricle response. Patient has been treated with heparin drip, will continue for another 24 hours per cardiology opinion.  Due to multiple medical problems, cardiology decided not to pursue cardiac cath.  Patient long-term prognosis is poor. Had a long discussion with patient daughter and wife, they prefer patient to be transferred to nursing home.  Social worker consulted.  Due to poor prognosis, I will obtain palliative care consult.  #2.  Dementia with delirium. Patient has significant delirium at nighttime, will start Seroquel 50 mg every evening, as needed Haldol for now.  3.  Thrombocytopenia Platelets at 73 and is stable.  4.  Chronic kidney disease stage IIIa. Renal function stable.  DVT prophylaxis:  Heparin Code Status: FUll Family Communication: Wife and daughter updated. Disposition Plan:  .   Status is: Observation  The patient will require care spanning > 2 midnights and should be moved to inpatient because: Inpatient level of care appropriate due to severity of illness  Dispo: The patient is from: Home              Anticipated d/c is to: SNF              Patient currently is not medically stable to d/c.   Difficult to place patient No        I/O last 3 completed shifts: In: 1461.9 [P.O.:360; I.V.:668.6; IV Piggyback:433.3] Out: 1900 [Urine:1900] Total I/O In: 240 [P.O.:240] Out: 300 [Urine:300]     Consultants:   cardiology  Procedures: None  Antimicrobials:None  Subjective: Patient did not sleep well last night, he was agitated and confused. He no longer experiencing chest pain or shortness of breath. No abdominal pain or nausea vomiting. No dysuria hematuria  No fever or chills. No headache or dizziness  No chest pain or palpitation.  Objective: Vitals:   10/15/20 0154 10/15/20 0400 10/15/20 0857 10/15/20 1133  BP:  107/67 128/84 124/70  Pulse:  (!) 55 63 66  Resp:  14 11 19   Temp:  (!) 97.4 F (36.3 C) (!) 97.5 F (36.4 C) 97.8 F (36.6 C)  TempSrc:  Axillary Oral Oral  SpO2:  93% 96% 97%  Weight: 69.6 kg     Height:        Intake/Output Summary (Last 24 hours) at 10/15/2020 1429 Last data filed at 10/15/2020 1336 Gross per 24 hour  Intake 730.28 ml  Output 2200  ml  Net -1469.72 ml   Filed Weights   10/14/20 0017 10/14/20 0245 10/15/20 0154  Weight: 68 kg 73.1 kg 69.6 kg    Examination:  General exam: Appears calm and comfortable  Respiratory system: Clear to auscultation. Respiratory effort normal. Cardiovascular system: S1 & S2 heard, RRR. No JVD, murmurs, rubs, gallops or clicks. No pedal edema. Gastrointestinal system: Abdomen is nondistended, soft and nontender. No organomegaly or masses felt. Normal bowel sounds  heard. Central nervous system: Alert and oriented x1. No focal neurological deficits. Extremities: Symmetric 5 x 5 power. Skin: No rashes, lesions or ulcers     Data Reviewed: I have personally reviewed following labs and imaging studies  CBC: Recent Labs  Lab 10/14/20 0015 10/14/20 0505 10/15/20 0505  WBC 5.6 5.6 6.7  NEUTROABS 3.8  --   --   HGB 12.8* 11.7* 13.5  HCT 39.0 35.2* 39.5  MCV 91.1 91.2 87.8  PLT 73* 73* 73*   Basic Metabolic Panel: Recent Labs  Lab 10/14/20 0015 10/15/20 0505  NA 139 138  K 4.0 3.8  CL 104 102  CO2 27 27  GLUCOSE 128* 96  BUN 27* 24*  CREATININE 1.35* 1.08  CALCIUM 8.7* 9.0  MG  --  2.1   GFR: Estimated Creatinine Clearance: 48.4 mL/min (by C-G formula based on SCr of 1.08 mg/dL). Liver Function Tests: Recent Labs  Lab 10/14/20 0015  AST 21  ALT 20  ALKPHOS 71  BILITOT 1.1  PROT 6.0*  ALBUMIN 4.1   Recent Labs  Lab 10/14/20 0015  LIPASE 29   No results for input(s): AMMONIA in the last 168 hours. Coagulation Profile: Recent Labs  Lab 10/14/20 0015  INR 1.1   Cardiac Enzymes: No results for input(s): CKTOTAL, CKMB, CKMBINDEX, TROPONINI in the last 168 hours. BNP (last 3 results) No results for input(s): PROBNP in the last 8760 hours. HbA1C: Recent Labs    10/14/20 0505  HGBA1C 5.2   CBG: No results for input(s): GLUCAP in the last 168 hours. Lipid Profile: Recent Labs    10/14/20 0505  CHOL 105  HDL 36*  LDLCALC 58  TRIG 57  CHOLHDL 2.9   Thyroid Function Tests: No results for input(s): TSH, T4TOTAL, FREET4, T3FREE, THYROIDAB in the last 72 hours. Anemia Panel: No results for input(s): VITAMINB12, FOLATE, FERRITIN, TIBC, IRON, RETICCTPCT in the last 72 hours. Sepsis Labs: No results for input(s): PROCALCITON, LATICACIDVEN in the last 168 hours.  Recent Results (from the past 240 hour(s))  Resp Panel by RT-PCR (Flu A&B, Covid) Nasopharyngeal Swab     Status: None   Collection Time: 10/14/20  12:29 AM   Specimen: Nasopharyngeal Swab; Nasopharyngeal(NP) swabs in vial transport medium  Result Value Ref Range Status   SARS Coronavirus 2 by RT PCR NEGATIVE NEGATIVE Final    Comment: (NOTE) SARS-CoV-2 target nucleic acids are NOT DETECTED.  The SARS-CoV-2 RNA is generally detectable in upper respiratory specimens during the acute phase of infection. The lowest concentration of SARS-CoV-2 viral copies this assay can detect is 138 copies/mL. A negative result does not preclude SARS-Cov-2 infection and should not be used as the sole basis for treatment or other patient management decisions. A negative result may occur with  improper specimen collection/handling, submission of specimen other than nasopharyngeal swab, presence of viral mutation(s) within the areas targeted by this assay, and inadequate number of viral copies(<138 copies/mL). A negative result must be combined with clinical observations, patient history, and epidemiological information. The expected result  is Negative.  Fact Sheet for Patients:  BloggerCourse.com  Fact Sheet for Healthcare Providers:  SeriousBroker.it  This test is no t yet approved or cleared by the [place] FDA and  has been authorized for detection and/or diagnosis of SARS-CoV-2 by FDA under an Emergency Use Authorization (EUA). This EUA will remain  in effect (meaning this test can be used) for the duration of the COVID-19 declaration under Section 564(b)(1) of the Act, 21 U.S.C.section 360bbb-3(b)(1), unless the authorization is terminated  or revoked sooner.       Influenza A by PCR NEGATIVE NEGATIVE Final   Influenza B by PCR NEGATIVE NEGATIVE Final    Comment: (NOTE) The Xpert Xpress SARS-CoV-2/FLU/RSV plus assay is intended as an aid in the diagnosis of influenza from Nasopharyngeal swab specimens and should not be used as a sole basis for treatment. Nasal washings and aspirates  are unacceptable for Xpert Xpress SARS-CoV-2/FLU/RSV testing.  Fact Sheet for Patients: BloggerCourse.com  Fact Sheet for Healthcare Providers: SeriousBroker.it  This test is not yet approved or cleared by the [place] FDA and has been authorized for detection and/or diagnosis of SARS-CoV-2 by FDA under an Emergency Use Authorization (EUA). This EUA will remain in effect (meaning this test can be used) for the duration of the COVID-19 declaration under Section 564(b)(1) of the Act, 21 U.S.C. section 360bbb-3(b)(1), unless the authorization is terminated or revoked.  Performed at [facility], [address] [place], [place] [address]          Radiology Studies: [facility] Chest [place] 1 View  Result Date: 10/14/2020 CLINICAL DATA:  Chest pain. EXAM: PORTABLE CHEST 1 VIEW COMPARISON:  August 19, 2015 FINDINGS: Multiple sternal wires and vascular clips are noted. Chronic appearing increased lung markings are seen. There is mild prominence of the perihilar pulmonary vasculature. There is no evidence of acute infiltrate, pleural effusion or pneumothorax. The cardiac silhouette is mildly enlarged. Degenerative changes seen throughout the thoracic spine. IMPRESSION: 1. Evidence of prior median sternotomy/CABG. 2. Cardiomegaly with mild pulmonary vascular congestion. Electronically Signed   By: Aram Candela M.D.   On: 10/14/2020 00:53   ECHOCARDIOGRAM COMPLETE  Result Date: 10/15/2020    ECHOCARDIOGRAM REPORT   Patient Name:   GILL DELROSSI Date of Exam: 10/14/2020 Medical Rec #:  [id number]     Height:       68.0 in Accession #:    [id number]    Weight:       161.2 lb Date of Birth:  12-06-1932    BSA:          1.865 m Patient Age:    87 years      BP:           141/92 mmHg Patient Gender: M             HR:           81 bpm. Exam Location:  [facility] Procedure: 2D Echo, Cardiac Doppler and Color Doppler Indications:     NSTEMI I21.4   History:         Patient has prior history of Echocardiogram examinations, most                  recent 06/04/2020. Previous Myocardial Infarction and CAD, TIA,                  Signs/Symptoms:Hypertensive Heart Disease; Risk  Factors:Hypertension.  Sonographer:     Tiburcio Pea, ANN Referring Phys:  [id number] Sondra Barges Diagnosing Phys: Kristeen Miss MD  Sonographer Comments: No subcostal window. Image acquisition challenging due to patient behavioral factors. and Image acquisition challenging due to uncooperative patient. IMPRESSIONS  1. Technically difficult study with poor image quality.  2. Left ventricular ejection fraction, by estimation, is 50 to 55%. The left ventricle has low normal function. The left ventricle demonstrates regional wall motion abnormalities (see scoring diagram/findings for description). Left ventricular diastolic  parameters are consistent with Grade II diastolic dysfunction (pseudonormalization). There is moderate hypokinesis of the left ventricular, basal-mid inferior wall and inferoseptal wall.  3. Right ventricular systolic function was not well visualized. The right ventricular size is normal.  4. The mitral valve is grossly normal. Mild mitral valve regurgitation. No evidence of mitral stenosis.  5. The aortic valve is tricuspid. Aortic valve regurgitation is trivial. No aortic stenosis is present. FINDINGS  Left Ventricle: Left ventricular ejection fraction, by estimation, is 50 to 55%. The left ventricle has low normal function. The left ventricle demonstrates regional wall motion abnormalities. Moderate hypokinesis of the left ventricular, basal-mid inferior wall and inferoseptal wall. The left ventricular internal cavity size was small. There is no left ventricular hypertrophy. Left ventricular diastolic parameters are consistent with Grade II diastolic dysfunction (pseudonormalization). Right Ventricle: The right ventricular size is normal. Right vetricular wall  thickness was not well visualized. Right ventricular systolic function was not well visualized. Left Atrium: Left atrial size was normal in size. Right Atrium: Right atrial size was normal in size. Pericardium: There is no evidence of pericardial effusion. Mitral Valve: The mitral valve is grossly normal. Mild mitral valve regurgitation. No evidence of mitral valve stenosis. Tricuspid Valve: The tricuspid valve is grossly normal. Tricuspid valve regurgitation is trivial. Aortic Valve: The aortic valve is tricuspid. Aortic valve regurgitation is trivial. No aortic stenosis is present. Aortic valve mean gradient measures 4.0 mmHg. Aortic valve peak gradient measures 7.7 mmHg. Aortic valve area, by VTI measures 2.30 cm. Pulmonic Valve: The pulmonic valve was normal in structure. Pulmonic valve regurgitation is not visualized. Aorta: The aortic root and ascending aorta are structurally normal, with no evidence of dilitation. IAS/Shunts: The atrial septum is grossly normal. Additional Comments: Technically difficult study with poor image quality.  LEFT VENTRICLE PLAX 2D LVIDd:         5.12 cm  Diastology LVIDs:         3.58 cm  LV e' medial:    3.81 cm/s LV PW:         0.97 cm  LV E/e' medial:  29.7 LV IVS:        1.09 cm  LV e' lateral:   4.90 cm/s LVOT diam:     2.00 cm  LV E/e' lateral: 23.1 LV SV:         66 LV SV Index:   35 LVOT Area:     3.14 cm  RIGHT VENTRICLE TAPSE (M-mode): 1.9 cm LEFT ATRIUM         Index LA diam:    3.70 cm 1.98 cm/m  AORTIC VALVE AV Area (Vmax):    2.46 cm AV Area (Vmean):   2.19 cm AV Area (VTI):     2.30 cm AV Vmax:           139.00 cm/s AV Vmean:          96.500 cm/s AV VTI:  0.286 m AV Peak Grad:      7.7 mmHg AV Mean Grad:      4.0 mmHg LVOT Vmax:         109.00 cm/s LVOT Vmean:        67.200 cm/s LVOT VTI:          0.209 m LVOT/AV VTI ratio: 0.73  AORTA Ao Root diam: 3.30 cm Ao Asc diam:  2.90 cm MITRAL VALVE                TRICUSPID VALVE MV Area (PHT): 5.97 cm      TR Peak grad:   45.4 mmHg MV Decel Time: 127 msec     TR Vmax:        337.00 cm/s MV E velocity: 113.00 cm/s MV A velocity: 113.00 cm/s  SHUNTS MV E/A ratio:  1.00         Systemic VTI:  0.21 m                             Systemic Diam: 2.00 cm Kristeen Miss MD Electronically signed by Kristeen Miss MD Signature Date/Time: 10/15/2020/9:46:09 AM    Final         Scheduled Meds: . aspirin EC  81 mg Oral Daily  . atorvastatin  80 mg Oral Daily  . clopidogrel  75 mg Oral Daily  . ezetimibe  10 mg Oral Daily  . furosemide  20 mg Intravenous Q12H  . galantamine  12 mg Oral BID  . irbesartan  150 mg Oral Daily  . levothyroxine  75 mcg Oral Q0600  . memantine  21 mg Oral Daily  . multivitamin with minerals  1 tablet Oral Daily  . pantoprazole  40 mg Oral Daily  . QUEtiapine  50 mg Oral QHS  . sertraline  50 mg Oral Daily  . vitamin B-12  1,000 mcg Oral Daily   Continuous Infusions: . heparin 1,050 Units/hr (10/15/20 0705)     LOS: 0 days    Time spent: 35 minutes, more than 50% time spent on direct patient care.      Marrion Coy, MD [facility]   To contact the attending provider between 7A-7P or the covering provider during after hours 7P-7A, please log into the web site [email or web address] and access using universal [place] password for that web site. If you do not have the password, please call the hospital operator.  10/15/2020, 2:29 PM

## 2020-10-15 NOTE — Progress Notes (Signed)
ANTICOAGULATION CONSULT NOTE   Pharmacy Consult for heparin infusion Indication: chest pain/ACS/STEMI  Patient Measurements: Height: 5\' 8"  (172.7 cm) Weight: 73.1 kg (161 lb 3.2 oz) IBW/kg (Calculated) : 68.4 Heparin Dosing Weight: 68 kg  Vital Signs: Temp: 97.7 F (36.5 C) (05/06 2030) Temp Source: Axillary (05/06 2030) BP: 105/84 (05/07 0000) Pulse Rate: 64 (05/07 0000)  Labs: Recent Labs    10/14/20 0015 10/14/20 0214 10/14/20 0505 10/14/20 1035 10/14/20 1636 10/14/20 2353  HGB 12.8*  --  11.7*  --   --   --   HCT 39.0  --  35.2*  --   --   --   PLT 73*  --  73*  --   --   --   LABPROT 14.4  --   --   --   --   --   INR 1.1  --   --   --   --   --   HEPARINUNFRC  --   --   --   --  <0.10* 0.23*  CREATININE 1.35*  --   --   --   --   --   TROPONINIHS 65* 593* 3,831* 12,292*  --   --     Estimated Creatinine Clearance: 38.7 mL/min (A) (by C-G formula based on SCr of 1.35 mg/dL (H)).   Medical History: Past Medical History:  Diagnosis Date  . Arthritis   . CAD (coronary artery disease)    post CABG; myoview 2009 neg  . Carotid artery disease (HCC)    Mild  . Cirrhosis of liver (HCC)   . Dementia (HCC)   . GERD (gastroesophageal reflux disease)   . Hepatitis C    Inactive  . Hyperlipidemia   . Hypertension   . Idiopathic thrombocytopenia (HCC)   . Memory loss   . Myocardial infarction [facility]) 1996   [place]  . RBBB (right bundle branch block)   . Right bundle branch block (RBBB)   . Right tibial fracture 2014  . Stroke [facility]) February 2016   [facility]  . Thrombocytopenia (HCC)   . TIA (transient ischemic attack)   . TIA (transient ischemic attack)      Assessment: Pt is a 87 y.o. male with med hx CAD post CABG, cirrhosis, GERD, dementia, hypertension, dyslipidemia, CVA/TIA, who presented to the ER with acute onset of chest pain that woke him up from sleep. Pt baseline thrombocytopenic w/ PLT 73. Heparin was started early this morning and paused an  unknown time for an attempted LHC and PCI which was unable to be completed.   Goal of Therapy:  Heparin level 0.3-0.7 units/ml Monitor platelets by anticoagulation protocol: Yes   05/06 2353 HL = 0.23, subtherapeutic   Plan:   Give 1000 unit bolus x 1  Increase heparin infusion to 1050 units/hr  Anti-Xa level in 8 hours after rate change  CBC daily while on heparin.  07/06, PharmD, MBA 10/15/2020 1:13 AM

## 2020-10-15 NOTE — Progress Notes (Signed)
ANTICOAGULATION CONSULT NOTE   Pharmacy Consult for heparin infusion Indication: chest pain/ACS/STEMI  Patient Measurements: Height: 5\' 8"  (172.7 cm) Weight: 69.6 kg (153 lb 7 oz) IBW/kg (Calculated) : 68.4 Heparin Dosing Weight: 68 kg  Vital Signs: Temp: 98.1 F (36.7 C) (05/07 1609) Temp Source: Oral (05/07 1609) BP: 123/86 (05/07 1609) Pulse Rate: 72 (05/07 1609)  Labs: Recent Labs    10/14/20 0015 10/14/20 0214 10/14/20 0505 10/14/20 1035 10/14/20 1636 10/14/20 2353 10/15/20 0505 10/15/20 0940 10/15/20 1734  HGB 12.8*  --  11.7*  --   --   --  13.5  --   --   HCT 39.0  --  35.2*  --   --   --  39.5  --   --   PLT 73*  --  73*  --   --   --  73*  --   --   LABPROT 14.4  --   --   --   --   --   --   --   --   INR 1.1  --   --   --   --   --   --   --   --   HEPARINUNFRC  --   --   --   --    < > 0.23*  --  0.67 0.52  CREATININE 1.35*  --   --   --   --   --  1.08  --   --   TROPONINIHS 65*   < > 3,831* 12,292*  --   --  4,627*  --   --    < > = values in this interval not displayed.    Estimated Creatinine Clearance: 48.4 mL/min (by C-G formula based on SCr of 1.08 mg/dL).   Medical History: Past Medical History:  Diagnosis Date  . Arthritis   . CAD (coronary artery disease)    post CABG; myoview 2009 neg  . Carotid artery disease (HCC)    Mild  . Cirrhosis of liver (HCC)   . Dementia (HCC)   . GERD (gastroesophageal reflux disease)   . Hepatitis C    Inactive  . Hyperlipidemia   . Hypertension   . Idiopathic thrombocytopenia (HCC)   . Memory loss   . Myocardial infarction [facility]) 1996   [place]  . RBBB (right bundle branch block)   . Right bundle branch block (RBBB)   . Right tibial fracture 2014  . Stroke [facility]) February 2016   [facility]  . Thrombocytopenia (HCC)   . TIA (transient ischemic attack)   . TIA (transient ischemic attack)      Assessment: Pt is a 87 y.o. male with med hx CAD post CABG, cirrhosis, GERD, dementia, hypertension,  dyslipidemia, CVA/TIA, who presented to the ER with acute onset of chest pain that woke him up from sleep. Pt baseline thrombocytopenic w/ PLT 73. Heparin was started early this morning and paused an unknown time for an attempted LHC and PCI which was unable to be completed.    0506 2353  HL=0.23   1000 unit bolus,Increase rate  1050 units/hr  0507 0940  HL= 0.67  Therapeutic- continue  0507 1734 HL= 0.52 Therapeutic x 2   Goal of Therapy:  Heparin level 0.3-0.7 units/ml Monitor platelets by anticoagulation protocol: Yes     Plan:   HL now therapeutic x2   Continue heparin infusion at 1050 units/hr  Will continue to check HL and CBC daily while  on heparin.  Per MD note 5/7: Heparin drip to be continued for a total of 48 hours (0730 on 5/8)  Gardner Candle, PharmD, BCPS Clinical Pharmacist 10/15/2020 6:02 PM

## 2020-10-15 NOTE — TOC Progression Note (Signed)
Transition of Care [facility]) - Progression Note    Patient Details  Name: Alex [name] MRN: [id number] Date of Birth: June 30, 1932  Transition of Care [facility]) CM/SW Contact  Bing Quarry, RN Phone Number: 10/15/2020, 2:31 PM  Clinical Narrative:  OBS status. Checked with UR RN and patient status has been changed to inpatient, though Dr Chipper Herb has not updated in chart per UR RN. Gabriel Cirri RN CM         Expected Discharge Plan and Services                                                 Social Determinants of Health (SDOH) Interventions    Readmission Risk Interventions No flowsheet data found.

## 2020-10-15 NOTE — Progress Notes (Signed)
ANTICOAGULATION CONSULT NOTE   Pharmacy Consult for heparin infusion Indication: chest pain/ACS/STEMI  Patient Measurements: Height: 5\' 8"  (172.7 cm) Weight: 69.6 kg (153 lb 7 oz) IBW/kg (Calculated) : 68.4 Heparin Dosing Weight: 68 kg  Vital Signs: Temp: 97.5 F (36.4 C) (05/07 0857) Temp Source: Oral (05/07 0857) BP: 128/84 (05/07 0857) Pulse Rate: 63 (05/07 0857)  Labs: Recent Labs    10/14/20 0015 10/14/20 0214 10/14/20 0505 10/14/20 1035 10/14/20 1636 10/14/20 2353 10/15/20 0505 10/15/20 0940  HGB 12.8*  --  11.7*  --   --   --  13.5  --   HCT 39.0  --  35.2*  --   --   --  39.5  --   PLT 73*  --  73*  --   --   --  73*  --   LABPROT 14.4  --   --   --   --   --   --   --   INR 1.1  --   --   --   --   --   --   --   HEPARINUNFRC  --   --   --   --  <0.10* 0.23*  --  0.67  CREATININE 1.35*  --   --   --   --   --  1.08  --   TROPONINIHS 65*   < > 3,831* 12,292*  --   --  4,627*  --    < > = values in this interval not displayed.    Estimated Creatinine Clearance: 48.4 mL/min (by C-G formula based on SCr of 1.08 mg/dL).   Medical History: Past Medical History:  Diagnosis Date  . Arthritis   . CAD (coronary artery disease)    post CABG; myoview 2009 neg  . Carotid artery disease (HCC)    Mild  . Cirrhosis of liver (HCC)   . Dementia (HCC)   . GERD (gastroesophageal reflux disease)   . Hepatitis C    Inactive  . Hyperlipidemia   . Hypertension   . Idiopathic thrombocytopenia (HCC)   . Memory loss   . Myocardial infarction [facility]) 1996   [place]  . RBBB (right bundle branch block)   . Right bundle branch block (RBBB)   . Right tibial fracture 2014  . Stroke [facility]) February 2016   [facility]  . Thrombocytopenia (HCC)   . TIA (transient ischemic attack)   . TIA (transient ischemic attack)      Assessment: Pt is a 87 y.o. male with med hx CAD post CABG, cirrhosis, GERD, dementia, hypertension, dyslipidemia, CVA/TIA, who presented to the ER with acute  onset of chest pain that woke him up from sleep. Pt baseline thrombocytopenic w/ PLT 73. Heparin was started early this morning and paused an unknown time for an attempted LHC and PCI which was unable to be completed.    0506 2353  HL=0.23   1000 unit bolus,Increase rate  1050 units/hr  0507 0940  HL= 0.67  Therapeutic- continue  Goal of Therapy:  Heparin level 0.3-0.7 units/ml Monitor platelets by anticoagulation protocol: Yes     Plan:   Continue heparin infusion at 1050 units/hr  Check Confirmatory HL level in 8 hours   CBC daily while on heparin.  Per MD note 5/7: Heparin drip to be continued for a total of 48 hours (0730 on 5/8)  7/8 PharmD Clinical Pharmacist 10/15/2020

## 2020-10-16 DIAGNOSIS — I5033 Acute on chronic diastolic (congestive) heart failure: Secondary | ICD-10-CM | POA: Diagnosis not present

## 2020-10-16 DIAGNOSIS — I4891 Unspecified atrial fibrillation: Secondary | ICD-10-CM | POA: Diagnosis not present

## 2020-10-16 LAB — TSH: TSH: 8.342 u[IU]/mL — ABNORMAL HIGH (ref 0.350–4.500)

## 2020-10-16 LAB — BASIC METABOLIC PANEL
Anion gap: 9 (ref 5–15)
BUN: 26 mg/dL — ABNORMAL HIGH (ref 8–23)
CO2: 25 mmol/L (ref 22–32)
Calcium: 8.6 mg/dL — ABNORMAL LOW (ref 8.9–10.3)
Chloride: 106 mmol/L (ref 98–111)
Creatinine, Ser: 1.15 mg/dL (ref 0.61–1.24)
GFR, Estimated: >60 mL/min (ref >60)
Glucose, Bld: 100 mg/dL — ABNORMAL HIGH (ref 70–99)
Potassium: 3.8 mmol/L (ref 3.5–5.1)
Sodium: 140 mmol/L (ref 135–145)

## 2020-10-16 LAB — CBC
HCT: 41 % (ref 39.0–52.0)
Hemoglobin: 13.7 g/dL (ref 13.0–17.0)
MCH: 30.4 pg (ref 26.0–34.0)
MCHC: 33.4 g/dL (ref 30.0–36.0)
MCV: 90.9 fL (ref 80.0–100.0)
Platelets: 71 10*3/uL — ABNORMAL LOW (ref 150–400)
RBC: 4.51 MIL/uL (ref 4.22–5.81)
RDW: 18.1 % — ABNORMAL HIGH (ref 11.5–15.5)
WBC: 6.1 10*3/uL (ref 4.0–10.5)
nRBC: 0 % (ref 0.0–0.2)

## 2020-10-16 LAB — HEPARIN LEVEL (UNFRACTIONATED)
Heparin Unfractionated: 0.41 IU/mL (ref 0.30–0.70)
Heparin Unfractionated: 0.51 IU/mL (ref 0.30–0.70)

## 2020-10-16 LAB — PHOSPHORUS: Phosphorus: 3.8 mg/dL (ref 2.5–4.6)

## 2020-10-16 LAB — MRSA PCR SCREENING: MRSA by PCR: NEGATIVE

## 2020-10-16 LAB — MAGNESIUM: Magnesium: 2 mg/dL (ref 1.7–2.4)

## 2020-10-16 MED ORDER — METOPROLOL TARTRATE 5 MG/5ML IV SOLN
INTRAVENOUS | Status: AC
  Administered 2020-10-16: 5 mg via INTRAVENOUS
  Filled 2020-10-16: qty 5

## 2020-10-16 MED ORDER — POTASSIUM CHLORIDE CRYS ER 20 MEQ PO TBCR
40.0000 meq | EXTENDED_RELEASE_TABLET | Freq: Once | ORAL | Status: AC
  Administered 2020-10-16: 40 meq via ORAL
  Filled 2020-10-16: qty 2

## 2020-10-16 MED ORDER — METOPROLOL TARTRATE 5 MG/5ML IV SOLN: 5.0000 mg | Freq: Once | INTRAVENOUS | Status: AC

## 2020-10-16 MED ORDER — AMIODARONE LOAD VIA INFUSION
150.0000 mg | Freq: Once | INTRAVENOUS | Status: AC
  Administered 2020-10-16: 150 mg via INTRAVENOUS
  Filled 2020-10-16: qty 83.34

## 2020-10-16 MED ORDER — AMIODARONE HCL IN DEXTROSE 360-4.14 MG/200ML-% IV SOLN
INTRAVENOUS | Status: AC
  Administered 2020-10-16: 150 mg/h via INTRAVENOUS
  Filled 2020-10-16: qty 200

## 2020-10-16 MED ORDER — AMIODARONE HCL IN DEXTROSE 360-4.14 MG/200ML-% IV SOLN
60.0000 mg/h | INTRAVENOUS | Status: DC
  Administered 2020-10-16: 60 mg/h via INTRAVENOUS

## 2020-10-16 MED ORDER — SODIUM CHLORIDE 0.9 % IV SOLN: 250.0000 mL | INTRAVENOUS | Status: DC

## 2020-10-16 MED ORDER — DILTIAZEM HCL 25 MG/5ML IV SOLN
5.0000 mg | Freq: Once | INTRAVENOUS | Status: AC
  Administered 2020-10-16: 5 mg via INTRAVENOUS

## 2020-10-16 MED ORDER — AMIODARONE HCL IN DEXTROSE 360-4.14 MG/200ML-% IV SOLN: 30.0000 mg/h | INTRAVENOUS | Status: DC

## 2020-10-16 MED ORDER — HALOPERIDOL LACTATE 5 MG/ML IJ SOLN: 2.0000 mg | Freq: Four times a day (QID) | INTRAMUSCULAR | Status: DC | PRN

## 2020-10-16 MED ORDER — AMIODARONE HCL 200 MG PO TABS
400.0000 mg | ORAL_TABLET | Freq: Two times a day (BID) | ORAL | Status: DC
  Administered 2020-10-16 – 2020-10-17 (×3): 400 mg via ORAL
  Filled 2020-10-16 (×3): qty 2

## 2020-10-16 MED ORDER — HEPARIN (PORCINE) 25000 UT/250ML-% IV SOLN: 1050.0000 [IU]/h | INTRAVENOUS | Status: DC

## 2020-10-16 MED ORDER — NOREPINEPHRINE 4 MG/250ML-% IV SOLN
2.0000 ug/min | INTRAVENOUS | Status: DC
  Filled 2020-10-16: qty 250

## 2020-10-16 MED ORDER — SODIUM CHLORIDE 0.9 % IV SOLN: Freq: Once | INTRAVENOUS | Status: AC

## 2020-10-16 MED ORDER — SODIUM CHLORIDE 0.9 % IV SOLN: Freq: Once | INTRAVENOUS | Status: DC

## 2020-10-16 MED ORDER — SODIUM CHLORIDE 0.9 % IV SOLN: INTRAVENOUS | Status: DC

## 2020-10-16 MED ORDER — CHLORHEXIDINE GLUCONATE CLOTH 2 % EX PADS: 6.0000 | MEDICATED_PAD | Freq: Every day | CUTANEOUS | Status: DC

## 2020-10-16 MED ORDER — DIGOXIN 0.25 MG/ML IJ SOLN: 0.2500 mg | INTRAMUSCULAR | Status: DC | PRN

## 2020-10-16 MED ORDER — DILTIAZEM HCL-DEXTROSE 125-5 MG/125ML-% IV SOLN (PREMIX)
2.5000 mg/h | INTRAVENOUS | Status: DC
  Administered 2020-10-16: 2.5 mg/h via INTRAVENOUS

## 2020-10-16 MED ORDER — DILTIAZEM HCL 25 MG/5ML IV SOLN: 5.0000 mg | Freq: Once | INTRAVENOUS | Status: AC

## 2020-10-16 MED ORDER — FUROSEMIDE 10 MG/ML IJ SOLN
20.0000 mg | Freq: Two times a day (BID) | INTRAMUSCULAR | Status: DC
  Administered 2020-10-16 – 2020-10-17 (×3): 20 mg via INTRAVENOUS
  Filled 2020-10-16 (×3): qty 2

## 2020-10-16 MED ORDER — DILTIAZEM HCL-DEXTROSE 125-5 MG/125ML-% IV SOLN (PREMIX)
5.0000 mg/h | INTRAVENOUS | Status: DC
  Administered 2020-10-16: 5 mg/h via INTRAVENOUS
  Filled 2020-10-16: qty 125

## 2020-10-16 NOTE — Progress Notes (Signed)
  Call rcvd from tele re elevated heart rate.  Patient asymptomatic, denies chest pain.  Rate is fluctuating from 130-170.  Provider notified.  EKG ordered.    Verbal order from Dr. Arville Care for EKG, 5 IV cardizem , 1-2 mg IM haldol Q6 PRN.    1140 - EKG results secure text to Dr. Arville Care, verified rate per EKG and monitor.    1150 - Last BP 106/83, pt reports chest pain.   BP rechecked 116/91 Map 101.  Secure text - admin 5 IV cardizem now.    Dr. Arville Care at bedside.  Ordered additional 5 IV cardizem, 40 po potassium, cardizem drip starting at 2.5/hr titrate to HR < 100 keep SBP > 100, 1L NS bolus,  digoxin 0.25 IV if HR increases, NS at 100 after bolus complete.    Pt given 0.5 PRN dilaudid for CP at 0008.    Cardizem drip started at 0048.    1 am BP 100/77 map 85, HR 139  At 30 minutes, patient rate increasing - up to 140s.  BP 86/45, map 74. Pt is diaphoretic.    Per Dr. Arville Care stop cardizem, bolus another 1L NS, amiodarone bolus 150 then start amio drip.    218 - BP 87/60, HR 90-110, order to hold 2 am lasix, continue to bolus for total 1L.    250 - BP 77/53, map 62.  Bolus complete.  NS running at 100.  HR 90-110, diaphoretic.  BP verified manual.    Per Dr. Arville Care, pt to be transferred to ICU for pressors.

## 2020-10-16 NOTE — Progress Notes (Signed)
ANTICOAGULATION CONSULT NOTE   Pharmacy Consult for heparin infusion Indication: afib/RVR  Patient Measurements: Height: 5\' 8"  (172.7 cm) Weight: 70.1 kg (154 lb 8.7 oz) IBW/kg (Calculated) : 68.4 Heparin Dosing Weight: 68 kg  Vital Signs: Temp: 96.7 F (35.9 C) (05/08 0400) Temp Source: Oral (05/08 0400) BP: 130/88 (05/08 1200) Pulse Rate: 67 (05/08 1200)  Labs: Recent Labs    10/14/20 0015 10/14/20 0214 10/14/20 0505 10/14/20 1035 10/14/20 1636 10/15/20 0505 10/15/20 0940 10/15/20 1734 10/16/20 0506  HGB 12.8*  --  11.7*  --   --  13.5  --   --  13.7  HCT 39.0  --  35.2*  --   --  39.5  --   --  41.0  PLT 73*  --  73*  --   --  73*  --   --  71*  LABPROT 14.4  --   --   --   --   --   --   --   --   INR 1.1  --   --   --   --   --   --   --   --   HEPARINUNFRC  --   --   --   --    < >  --  0.67 0.52 0.41  CREATININE 1.35*  --   --   --   --  1.08  --   --  1.15  TROPONINIHS 65*   < > 3,831* 12,292*  --  4,627*  --   --   --    < > = values in this interval not displayed.    Estimated Creatinine Clearance: 45.4 mL/min (by C-G formula based on SCr of 1.15 mg/dL).   Medical History: Past Medical History:  Diagnosis Date  . Arthritis   . CAD (coronary artery disease)    post CABG; myoview 2009 neg  . Carotid artery disease (HCC)    Mild  . Cirrhosis of liver (HCC)   . Dementia (HCC)   . GERD (gastroesophageal reflux disease)   . Hepatitis C    Inactive  . Hyperlipidemia   . Hypertension   . Idiopathic thrombocytopenia (HCC)   . Memory loss   . Myocardial infarction [facility]) 1996   [place]  . RBBB (right bundle branch block)   . Right bundle branch block (RBBB)   . Right tibial fracture 2014  . Stroke [facility]) February 2016   [facility]  . Thrombocytopenia (HCC)   . TIA (transient ischemic attack)   . TIA (transient ischemic attack)      Assessment: Pt is a 87 y.o. male with med hx CAD post CABG, cirrhosis, GERD, dementia, hypertension, dyslipidemia,  CVA/TIA, who presented to the ER with acute onset of chest pain that woke him up from sleep. He is noted to be baseline thrombocytopenic. There was an attempted LHC and PCI on 10/14/20 which was unable to be completed. Heparin was continued for 48 hours, however, overnight he developed afib/RVR and required transfer to the step-down unit. H&H wnl, platelets while low are stable.  Goal of Therapy:  Heparin level 0.3-0.7 units/ml Monitor platelets by anticoagulation protocol: Yes   Plan:   Bolus deferred due to limited time infusion was off and multiple consecutive therapeutic anti-Xa levels  restart heparin infusion at 1050 units/hr   Check anti-Xa level 8 hours after restarting  CBC daily while on heparin  12/14/20, PharmD, BCPS Clinical Pharmacist 10/16/2020 12:38 PM

## 2020-10-16 NOTE — Progress Notes (Signed)
Patient is seen by ICU today. I will pick patient up when transferred out to medical floor.

## 2020-10-16 NOTE — Progress Notes (Signed)
PHARMACY CONSULT NOTE  Pharmacy Consult for Electrolyte Monitoring and Replacement   Recent Labs: Potassium (mmol/L)  Date Value  10/16/2020 3.8  04/26/2013 3.7   Magnesium (mg/dL)  Date Value  57/84/6962 2.0  04/26/2013 1.8   Calcium (mg/dL)  Date Value  95/28/4132 8.6 (L)   Calcium, Total (mg/dL)  Date Value  44/06/270 8.9   Albumin (g/dL)  Date Value  53/66/4403 4.1  08/17/2012 4.2   Phosphorus (mg/dL)  Date Value  47/42/5956 3.8   Sodium (mmol/L)  Date Value  10/16/2020 140  04/26/2013 138   Assessment: 87 y.o. malewith med hx CAD post CABG, cirrhosis, GERD, dementia, hypertension, dyslipidemia, CVA/TIA, who presented to the ER with acute onset of chest pain that woke him up from sleep. He is noted to be baseline thrombocytopenic. There was an attempted LHC and PCI on 10/14/20 which was unable to be completed. Heparin was continued for 48 hours, however, overnight he developed afib/RVR and required transfer to the step-down unit. Pharmacy is asked to follow and replace electrolytes as needed.  Goal of Therapy:  Potassium 4.0 - 5.1 mmol/L Magnesium 2.0 - 2.4 mg/dL All Other Electrolytes WNL  Plan:   40 mEq oral KCl x 1  Re-check electrolytes in am  Lowella Bandy ,PharmD Clinical Pharmacist 10/16/2020 12:54 PM

## 2020-10-16 NOTE — Progress Notes (Addendum)
Critical care note:  Date of note: 10/15/2020.  Subjective: The patient has been in normal sinus rhythm and around 1128 his heart rate is been fluctuating from  130-170 and irregular with associated chest pain.  Stat EKG showed atrial flutter with variable AV block with a rate of 163 and it has been fluctuating on monitor.  No nausea or vomiting.  No fever or chills.  No cough or wheezing or dyspnea.  Subjective: Physical examination: Generally: Acutely ill elderly Caucasian male in mild respiratory distress with conversational dyspnea. Vital signs: Blood pressure was 116/91 and later 103/71.  RR was 12.  Heart rate is above.  Temperature was 98 earlier and pulse extremity was 96 and later 92% on room air. Head - atraumatic, normocephalic.  Pupils - equal, round and reactive to light and accommodation. Extraocular movements are intact. No scleral icterus.  Oropharynx - moist mucous membranes and tongue. No pharyngeal erythema or exudate.  Neck - supple. No JVD. Carotid pulses 2+ bilaterally. No carotid bruits. No palpable thyromegaly or lymphadenopathy. Cardiovascular -irregularly irregular tachycardic rhythm. Normal S1 and S2.  2/6 systolic ejection murmur at left lower sternal border with no gallops or rubs.  Lungs - clear to auscultation bilaterally.  Abdomen - soft and nontender. Positive bowel sounds. No palpable organomegaly or masses.  Extremities - no pitting edema, clubbing or cyanosis.  Neuro - grossly non-focal. Skin - no rashes. GU and rectal exam - deferred.  Labs and notes were reviewed.  Assessment/plan: 1.  Atrial fibrillation with rapid ventricular response with associated chest pain. - The patient was given IV Cardizem 5 mg twice with 250 mill IV normal saline wide open followed by 100 mill per hour and later 0.5 mg of IV Dilaudid for his chest pain. - He will be placed on IV Cardizem drip starting with 2.5 mg/h and titrating for heart rate less than 112 maintaining  systolic BP more than 100. - His heart rate is improved from 1 60-98 has been fluctuating between 100 115. - It was given 40 mEq p.o. potassium chloride to optimize his potassium.  His magnesium was 2.1 yesterday. - We will utilize IV digoxin as his blood pressure went from 116/91 to 103/71 with Cardizem. Will be IV amiodarone if he is not responding.  We will continue to closely monitor him. - Other current plan of care will be pursued.  Authorized and performed by: Valente David, MD Total critical care time: Approximately   30    minutes. Due to a high probability of clinically significant, life-threatening deterioration, the patient required my highest level of preparedness to intervene emergently and I personally spent this critical care time directly and personally managing the patient.  This critical care time included obtaining a history, examining the patient, pulse oximetry, ordering and review of studies, arranging urgent treatment with development of management plan, evaluation of patient's response to treatment, frequent reassessment, and discussions with other providers. This critical care time was performed to assess and manage the high probability of imminent, life-threatening deterioration that could result in multiorgan failure.  It was exclusive of separately billable procedures and treating other patients and teaching time.  Please see MDM section and the rest of the note for further information on patient assessment and treatment.  Addendum:  Despite IV Cardizem drip the patient was still tachycardic and BP was 86/65 and he was diaphoretic.  He was given wide-open IV normal saline and IV Cardizem drip was held off then the patient was given  150 mg of IV amiodarone followed by drip with much better control of his heart rate that was down to 90.  After the bolus was complete his blood pressure was better at 106/78 and later unfortunately dropped to 87/60 without responding to further  IV normal saline bolus.  His Lasix 20 mg dose was held off.  After completion of his IV 1 L bolus his blood pressure was still 77/53 with a MAP of 62 and he was lethargic.  I transferred him to to the ICU to start him on IV Levophed.  I discussed the case with Dr. Henry Russel as well as the ICU NP Ms. Rust-Chester.  The patient's care will be assumed by the ICU team.  Additional critical care time spent 20 minutes.

## 2020-10-16 NOTE — Progress Notes (Signed)
Pt cardizem drip started. Will continue monitor.

## 2020-10-16 NOTE — Progress Notes (Addendum)
Progress Note  Patient Name: Alex [name] Date of Encounter: 10/16/2020  Primary Cardiologist: Mariah Milling  Subjective   Developed chest pain and Afib with RVR overnight. Treated with IV diltiazem, dilaudid, Cardizem gtt, and IV digoxin. Developed hypotension and was transferred to the ICU, though did not require vasopressor support. He converted to sinus rhythm at 0352 on 5/8 and has maintained sinus with frequent atrial ectopy since. BP stable in the low 100s mmHg systolic.   Family at bedside this morning. Patient denies chest pain, dyspnea, or palpitations.   Inpatient Medications    Scheduled Meds: . amiodarone  400 mg Oral BID  . aspirin EC  81 mg Oral Daily  . atorvastatin  80 mg Oral Daily  . Chlorhexidine Gluconate Cloth  6 each Topical Daily  . clopidogrel  75 mg Oral Daily  . ezetimibe  10 mg Oral Daily  . furosemide  20 mg Intravenous Q12H  . galantamine  12 mg Oral BID  . levothyroxine  75 mcg Oral Q0600  . memantine  21 mg Oral Daily  . multivitamin with minerals  1 tablet Oral Daily  . pantoprazole  40 mg Oral Daily  . QUEtiapine  50 mg Oral QHS  . sertraline  50 mg Oral Daily  . vitamin B-12  1,000 mcg Oral Daily   Continuous Infusions: . sodium chloride Stopped (10/16/20 0346)  . norepinephrine (LEVOPHED) Adult infusion Stopped (10/16/20 0347)   PRN Meds: acetaminophen **OR** acetaminophen, haloperidol lactate, hydrALAZINE, magnesium hydroxide, nitroGLYCERIN, ondansetron **OR** ondansetron (ZOFRAN) IV   Vital Signs    Vitals:   10/16/20 0600 10/16/20 0630 10/16/20 0700 10/16/20 0800  BP: 112/66 102/73 107/65 108/69  Pulse: 66 66 67 61  Resp: 12 15 11  (!) 9  Temp:      TempSrc:      SpO2: 96% 96% 91% 93%  Weight:      Height:        Intake/Output Summary (Last 24 hours) at 10/16/2020 0924 Last data filed at 10/16/2020 0800 Gross per 24 hour  Intake 909.22 ml  Output 1350 ml  Net -440.78 ml   Filed Weights   10/14/20 0245 10/15/20 0154 10/16/20  0500  Weight: 73.1 kg 69.6 kg 70.1 kg    Telemetry    Afib overnight converting to sinus rhythm at 0352 without significant post-termination pause, maintaining sinus rhythm with frequent PACs - Personally Reviewed  ECG    Afib, 94 bpm, RBBB, LVH with early repolarization abnormalities and nonspecific changes - Personally Reviewed  Physical Exam   GEN: No acute distress.   Neck: No JVD. Cardiac: Regular rate with frequent ectopy, II/VI sysotlic murmur at the apex, no rubs, or gallops.  Respiratory: Clear to auscultation bilaterally.  GI: Soft, nontender, non-distended.   MS: No edema; No deformity. Neuro:  Somnolent.  Psych: Somnolent.  Labs    Chemistry Recent Labs  Lab 10/14/20 0015 10/15/20 0505 10/16/20 0506  NA 139 138 140  K 4.0 3.8 3.8  CL 104 102 106  CO2 27 27 25   GLUCOSE 128* 96 100*  BUN 27* 24* 26*  CREATININE 1.35* 1.08 1.15  CALCIUM 8.7* 9.0 8.6*  PROT 6.0*  --   --   ALBUMIN 4.1  --   --   AST 21  --   --   ALT 20  --   --   ALKPHOS 71  --   --   BILITOT 1.1  --   --   [facility]  51* >60 >60  ANIONGAP 8 9 9      Hematology Recent Labs  Lab 10/14/20 0505 10/15/20 0505 10/16/20 0506  WBC 5.6 6.7 6.1  RBC 3.86* 4.50 4.51  HGB 11.7* 13.5 13.7  HCT 35.2* 39.5 41.0  MCV 91.2 87.8 90.9  MCH 30.3 30.0 30.4  MCHC 33.2 34.2 33.4  RDW 18.0* 17.7* 18.1*  PLT 73* 73* 71*    Cardiac EnzymesNo results for input(s): TROPONINI in the last 168 hours. No results for input(s): TROPIPOC in the last 168 hours.   BNP Recent Labs  Lab 10/14/20 0015  BNP 411.8*     DDimer No results for input(s): DDIMER in the last 168 hours.   Radiology    CXR 10/14/2020:  IMPRESSION: 1. Evidence of prior median sternotomy/CABG. 2. Cardiomegaly with mild pulmonary vascular congestion.  Cardiac Studies   2D echo 10/2020: 1. Technically difficult study with poor image quality.  2. Left ventricular ejection fraction, by estimation, is 50 to 55%. The  left  ventricle has low normal function. The left ventricle demonstrates  regional wall motion abnormalities (see scoring diagram/findings for  description). Left ventricular diastolic  parameters are consistent with Grade II diastolic dysfunction  (pseudonormalization). There is moderate hypokinesis of the left  ventricular, basal-mid inferior wall and inferoseptal wall.  3. Right ventricular systolic function was not well visualized. The right  ventricular size is normal.  4. The mitral valve is grossly normal. Mild mitral valve regurgitation.  No evidence of mitral stenosis.  5. The aortic valve is tricuspid. Aortic valve regurgitation is trivial.  No aortic stenosis is present. __________  2D echo 05/2020: 1. Left ventricular ejection fraction, by estimation, is 50 to 55%. The  left ventricle has low normal function. The left ventricle has no regional  wall motion abnormalities. The left ventricular internal cavity size was  mildly dilated. There is mild  concentric left ventricular hypertrophy. Left ventricular diastolic  parameters are consistent with Grade I diastolic dysfunction (impaired  relaxation).  2. Right ventricular systolic function is normal. The right ventricular  size is normal. There is mildly elevated pulmonary artery systolic  pressure.  3. Left atrial size was mildly dilated.  4. The mitral valve is grossly normal. Mild to moderate mitral valve  regurgitation.  5. The aortic valve is normal in structure. Aortic valve regurgitation is  not visualized. No aortic stenosis is present. __________  2D echo 12/2019: 1. Left ventricular ejection fraction, by estimation, is 50 to 55%. The  left ventricle has low normal function. The left ventricle has no regional  wall motion abnormalities. There is moderate left ventricular hypertrophy.  Left ventricular diastolic  parameters are consistent with Grade II diastolic dysfunction  (pseudonormalization).  2.  Right ventricular systolic function is normal. The right ventricular  size is normal. There is moderately elevated pulmonary artery systolic  pressure.  3. The mitral valve is grossly normal. Mild to moderate mitral valve  regurgitation.  4. The aortic valve is tricuspid. Aortic valve regurgitation is mild.  5. The inferior vena cava is dilated in size with <50% respiratory  variability, suggesting right atrial pressure of 15 mmHg. __________  Nuclear stress test 08/2015: Pharmacological myocardial perfusion imaging study with no significant ischemia Basal to apical inferoseptal wall motion abnormality,likely secondary to conduction abnormality  EF estimated at 55% No EKG changes concerning for ischemia at peak stress or in recovery. Baseline EKG with resting ST and T wave abnormality Low risk scan __________  Nuclear stress test  04/2013: No significant ischemia, attenuation artifact noted, no significant wall motion abnormality, EF 57%, equivocal EKG changes, low risk scan.  Patient Profile     87 y.o. male with history of CAD status post CABG in 1996, CVA in 07/2014,epidural hematoma,subdural hematoma, hepatitis C with cirrhosis, esophageal varices, dementia, and hypothyroidismwho is being seen today for the evaluation of NSTEMIat the request of Dr. Arville Care.  Assessment & Plan    1. CAD s/p CABG with NSTEMI: -High-sensitivity troponin peaked at [address], and is down trending -Wife wanted to pursue LHC, though this was not able to be completed as the patient was unable to follow commands during the procedure, therefore the procedure was aborted for patient safety -He will be medically managed -PTA ASA and Plavix -Has completed IV heparin drip, though this remains in the setting of Afib as below -Echo as above -Code status should be addressed with recommendation to consider palliative care consult by primary service to address goals of care   2. Afib: -He developed Afib with  RVR on 5/7 as outlined in IM note and was transferred to the ICU -Currently in sinus rhythm -High risk for recurrent arrhythmia -Start amiodarone 400 mg bid, if he has recurrent atrial arrhythmia may need to reload with IV amiodarone  -Lytes at goal -Check TSH -CHADS2VASc 5 (CHF, HTN, age x 2, vascular disease) -Heparin gtt for now -May not be a great candidate for long term full dose OAC given recent subdural hematoma requiring evacuation, liver cirrhosis, and thrombocytopenia, as well as need for antiplatelet therapy given medical management of NSTEMI -Unable to add beta blocker with underlying bradycardic rates  3.HFpEF: -IV Lasix held with hypotension  -Echo as above  4. HTN: -Improved -Hold irbesartan with soft BP and recent hypotension   5. HLD: -LDL 58 this admission -Continue PTA atorvastatin and Zetia  6.History of CVA along with history ofepidural and subdural hematoma: -No new deficits  7. Dementia: -Wife is making medical decisions with HCPOA   For questions or updates, please contact [facility] Please consult [email or web address] for contact info under Cardiology/STEMI.    Signed, Eula Listen, PA-C [facility] Pager: [phone] 10/16/2020, 9:24 AM    Attending Note:   The patient was seen and examined.  Agree with assessment and plan as noted above.  Changes made to the above note as needed.  Patient seen and independently examined with Eula Listen, PA .   We discussed all aspects of the encounter. I agree with the assessment and plan as stated above.  1.   Atrial fib:   Pt developed atrial fib with RVR last night.  Associated with hypotension. Was started on amiodarone drip but this was stopped when it was thought it might be contributing to his hypotension On heparin drip He is not a good candidate for long term anticoagulation - has a hx of falling and subdural hematoma around 6 months ago . DC amio drip Start PO amio   2.  CAD  / NSTEMI :    Medical therapy .  3  HTN:   Cont current meds.    I have spent a total of 40 minutes with patient reviewing hospital  notes , telemetry, EKGs, labs and examining patient as well as establishing an assessment and plan that was discussed with the patient. > 50% of time was spent in direct patient care.    Vesta Mixer, Montez Hageman., MD, [facility] 10/16/2020, 9:55 AM [address],  [address] Office -  [phone] Pager [phone]

## 2020-10-16 NOTE — Progress Notes (Signed)
ANTICOAGULATION CONSULT NOTE   Pharmacy Consult for heparin infusion Indication: afib/RVR  Patient Measurements: Height: 5\' 8"  (172.7 cm) Weight: 70.1 kg (154 lb 8.7 oz) IBW/kg (Calculated) : 68.4 Heparin Dosing Weight: 68 kg  Vital Signs: Temp: 97.9 F (36.6 C) (05/08 1600) Temp Source: Axillary (05/08 1600) BP: 137/85 (05/08 1600) Pulse Rate: 68 (05/08 1600)  Labs: Recent Labs    10/14/20 0015 10/14/20 0214 10/14/20 0505 10/14/20 1035 10/14/20 1636 10/15/20 0505 10/15/20 0940 10/15/20 1734 10/16/20 0506 10/16/20 1746  HGB 12.8*  --  11.7*  --   --  13.5  --   --  13.7  --   HCT 39.0  --  35.2*  --   --  39.5  --   --  41.0  --   PLT 73*  --  73*  --   --  73*  --   --  71*  --   LABPROT 14.4  --   --   --   --   --   --   --   --   --   INR 1.1  --   --   --   --   --   --   --   --   --   HEPARINUNFRC  --   --   --   --    < >  --    < > 0.52 0.41 0.51  CREATININE 1.35*  --   --   --   --  1.08  --   --  1.15  --   TROPONINIHS 65*   < > 3,831* 12,292*  --  4,627*  --   --   --   --    < > = values in this interval not displayed.    Estimated Creatinine Clearance: 45.4 mL/min (by C-G formula based on SCr of 1.15 mg/dL).   Medical History: Past Medical History:  Diagnosis Date  . Arthritis   . CAD (coronary artery disease)    post CABG; myoview 2009 neg  . Carotid artery disease (HCC)    Mild  . Cirrhosis of liver (HCC)   . Dementia (HCC)   . GERD (gastroesophageal reflux disease)   . Hepatitis C    Inactive  . Hyperlipidemia   . Hypertension   . Idiopathic thrombocytopenia (HCC)   . Memory loss   . Myocardial infarction [facility]) 1996   [place]  . RBBB (right bundle branch block)   . Right bundle branch block (RBBB)   . Right tibial fracture 2014  . Stroke [facility]) February 2016   [facility]  . Thrombocytopenia (HCC)   . TIA (transient ischemic attack)   . TIA (transient ischemic attack)      Assessment: Pt is a 87 y.o. male with med hx CAD post  CABG, cirrhosis, GERD, dementia, hypertension, dyslipidemia, CVA/TIA, who presented to the ER with acute onset of chest pain that woke him up from sleep. He is noted to be baseline thrombocytopenic. There was an attempted LHC and PCI on 10/14/20 which was unable to be completed. Heparin was continued for 48 hours, however, overnight he developed afib/RVR and required transfer to the step-down unit. H&H wnl, platelets while low are stable.  5/8 1746 HL 0.51  Goal of Therapy:  Heparin level 0.3-0.7 units/ml Monitor platelets by anticoagulation protocol: Yes   Plan:   HL of 0.51 is therapeutic   continue heparin infusion at 1050 units/hr   Check anti-Xa  with AM labs  CBC daily while on heparin  Clovia Cuff, PharmD, BCPS 10/16/2020 6:15 PM

## 2020-10-16 NOTE — Plan of Care (Signed)
Neuro: Patient has been alert throughout the day and continues to have periods of confusion, this afternoon he became tearful due to not being able to recognize people or the environment. Eventually he was able to recognize his wife but could not identify his son.   Resp: stable on room air CV: afebrile, vital signs stable GIGU: tolerating PO well, voiding in urinal, BM x 1 Skin: clean dry and intact Social: Wife and son visiting throughout that day, all questions and concerns addressed.  *Family has expressed the desire to speak with social work and palliative care to see what their options are for him.  The son stated that when the patient created his POA (copy in the chart) he expressed to the family that he wanted to die at home.  The wife has been trying to keep to his desires but is struggling to keep up with his care and care for herself.     Problem: Education: Goal: Knowledge of General Education information will improve Description: Including pain rating scale, medication(s)/side effects and non-pharmacologic comfort measures Outcome: Progressing   Problem: Health Behavior/Discharge Planning: Goal: Ability to manage health-related needs will improve Outcome: Progressing   Problem: Clinical Measurements: Goal: Ability to maintain clinical measurements within normal limits will improve Outcome: Progressing Goal: Will remain free from infection Outcome: Progressing Goal: Diagnostic test results will improve Outcome: Progressing Goal: Respiratory complications will improve Outcome: Progressing Goal: Cardiovascular complication will be avoided Outcome: Progressing   Problem: Activity: Goal: Risk for activity intolerance will decrease Outcome: Progressing   Problem: Nutrition: Goal: Adequate nutrition will be maintained Outcome: Progressing   Problem: Coping: Goal: Level of anxiety will decrease Outcome: Progressing   Problem: Elimination: Goal: Will not experience  complications related to bowel motility Outcome: Progressing Goal: Will not experience complications related to urinary retention Outcome: Progressing   Problem: Pain Managment: Goal: General experience of comfort will improve Outcome: Progressing   Problem: Safety: Goal: Ability to remain free from injury will improve Outcome: Progressing   Problem: Skin Integrity: Goal: Risk for impaired skin integrity will decrease Outcome: Progressing   Problem: Education: Goal: Ability to demonstrate management of disease process will improve Outcome: Progressing Goal: Ability to verbalize understanding of medication therapies will improve Outcome: Progressing Goal: Individualized Educational Video(s) Outcome: Progressing   Problem: Activity: Goal: Capacity to carry out activities will improve Outcome: Progressing   Problem: Cardiac: Goal: Ability to achieve and maintain adequate cardiopulmonary perfusion will improve Outcome: Progressing

## 2020-10-16 NOTE — Consult Note (Addendum)
NAME:  Alex [name], MRN:  [id number], DOB:  1933-02-16, LOS: 1 ADMISSION DATE:  10/14/2020, CONSULTATION DATE:  10/16/20  REFERRING MD:  Dr. Arville CareMansy, CHIEF COMPLAINT:  Chest pain   History of Present Illness:  87 yo male [facility] from home via EMS due to reports of chest pain that woke him up from sleep. Per ED documentation wife gave the patient SLG nitro x 3 without relief. EMS noted the patient to be in A-fib RVR in the 160's. Patient received 324 ASA, 5 mg of metoprolol & 500 mL of NS. Per family patient does not have a history of Atrial Fibrillation. ED course: Initial EKG showed NSR with RBBB. Upon arrival to ED vitals were stable. The patient received IVF, IV lasix 20 mg, IV diltiazem 10 mg and was placed on a heparin drip. [facility] Appears patient's chest pain resolved after arrival to hospital. Significant Labs: AKI with BUN/Cr: 27/1.35, Elevated BNP: 411.8, Troponin: 65 > 593 > 3831 > 12,292 > 4,627.  Hospital Course: Patient was admitted to PCU, [facility] for N-Stemi and possible A-fib. Consideration for LHC which was ultimately deferred due to underlying dementia and inability to follow commands. Troponin peaked at 12,292, being medically managed. Overnight the patient developed Atrial Fibrillation RVR, and was given Diltiazem 5 mg x 2, followed by a diltiazem drip with 250 mL IVF bolus. Per documentation the patient also received 0.5 mg of dilaudid for chest pain, 40 meq of K+. BP became marginal with diltiazem and the drip was stopped, Amiodarone bolus given followed by an amiodarone drip. This was also stopped due to hypotension with BP 76/53 (62). Amiodarone was stopped and IVF bolus initiated.  PCCM consulted and patient transferred due to anticipated need of vasopressors as well as Amiodarone drip. Pertinent  Medical History  CAD s/p CABG 1996 Cirrhosis Dementia HTN Dyslipidemia CVA/TIA  Significant Hospital  Events: Including procedures, antibiotic start and stop dates in addition to other pertinent events   . 10/14/20 - Admitted to PCU with N-STEMI & arrythmia . 10/16/20 - overnight developed AF RVR, received amiodarone bolus and transferred to ICU  Interim History / Subjective:  Patient appears to be resting comfortably, not very interactive- no c/o pain.  Vitals stable off amiodarone drip, patient remains in controlled A-fib on heparin drip for N-Stemi. Will monitor rate, if develops RVR will consider placing the patient back on amiodarone continuously. No vasopressors necessary at this time.  Objective   Blood pressure (!) 154/114, pulse 93, temperature 98 F (36.7 C), temperature source Oral, resp. rate 19, height 5\' 8"  (1.727 m), weight 69.6 kg, SpO2 92 %.        Intake/Output Summary (Last 24 hours) at 10/16/2020 0355 Last data filed at 10/15/2020 1920 Gross per 24 hour  Intake 608.66 ml  Output 2000 ml  Net -1391.34 ml   Filed Weights   10/14/20 0017 10/14/20 0245 10/15/20 0154  Weight: 68 kg 73.1 kg 69.6 kg    Examination: General: Adult male, critically vs chronically ill, lying in bed, NAD HEENT: MM pink/moist, anicteric, atraumatic, neck supple Neuro: not interactive, unable to follow commands (per nursing this has been baseline), PERRL +3, MAE CV: s1s2 irregular, Atrial fibrillation on monitor, mild murmur present, no r/g Pulm: Regular, non labored on room air, breath sounds diminished throughout GI: soft, rounded, non tender, bs x 4 Skin: no rashes/lesions noted Extremities: warm/dry, pulses + 2 R/P, no edema noted  Labs/imaging that I have personally reviewed  (right click and "Reselect all SmartList Selections" daily)  EKG Interpretation Date: 10/16/20 EKG Time: 0336 Rate: 94  Rhythm: Atrial Fibrillation QRS Axis:  LAD  Intervals: RBBB ST/T Wave abnormalities: mild STE in lead III & ST depression in lateral leads ( similar to previous EKG ) Narrative Interpretation:  Atrial Fibrillation with RBBB  Net: - 740 mL (-1.1 L since admit) Na+/ K+: 138/ 3.8 BUN/Cr.: 24/ 1.08 Serum CO2/ AG: 27/9  Hgb: 13.5 Troponin: 65 > 593 > 3831 > 12,292 > 4627 BNP: 411.8  WBC/ TMAX: 6.7/ 36.7  CXR 10/14/20: cardiomegaly & mild pulmonary vascular congestion Resolved Hospital Problem list   AKI from admission has resolved  Assessment & Plan:  Paroxysmal Atrial Fibrillation with Rapid Ventricular Response CAD s/p CABG with N-STEMI Reports from EMS that patient was in AFib RVR in field, on arrival to ED converted back to NSR. Overnight developed A-fib RVR, received dilt IVP, dilt drip that was ultimately stopped, amio bolus & amio drip that was stopped due to hypotension. - monitor, if patient re-develops RVR consider re-starting Amiodarone infusion/ lopressor 2.5 mg IVP PRN - Heparin drip per pharmacy consult - Cardiology consulted, appreciate input - Continuous cardiac monitoring  HFpEF- LVEF 50-55% with G2DD - lasix BID re-timed for 10:00, consider holding if BP remains marginal  HTN Currently BP marginal - soft, hypotensive episode overnight - cardiology recommendation to hold irbesartan for SBP < 100 - continue hydralazine PRN for BP > 160/ 110  HLD - continue home regimen: lipitor & zetia  Best practice (right click and "Reselect all SmartList Selections" daily)  Diet:  Oral Pain/Anxiety/Delirium protocol (if indicated): No VAP protocol (if indicated): Not indicated DVT prophylaxis: Systemic AC GI prophylaxis: N/A Glucose control:  SSI No Central venous access:  N/A Arterial line:  N/A Foley:  N/A Mobility:  OOB  PT consulted: N/A Last date of multidisciplinary goals of care discussion 10/15/20 Code Status:  full code Disposition: SDU  Labs   CBC: Recent Labs  Lab 10/14/20 0015 10/14/20 0505 10/15/20 0505  WBC 5.6 5.6 6.7  NEUTROABS 3.8  --   --   HGB 12.8* 11.7* 13.5  HCT 39.0 35.2* 39.5  MCV 91.1 91.2 87.8  PLT 73* 73* 73*    Basic  Metabolic Panel: Recent Labs  Lab 10/14/20 0015 10/15/20 0505  NA 139 138  K 4.0 3.8  CL 104 102  CO2 27 27  GLUCOSE 128* 96  BUN 27* 24*  CREATININE 1.35* 1.08  CALCIUM 8.7* 9.0  MG  --  2.1   GFR: Estimated Creatinine Clearance: 48.4 mL/min (by C-G formula based on SCr of 1.08 mg/dL). Recent Labs  Lab 10/14/20 0015 10/14/20 0505 10/15/20 0505  WBC 5.6 5.6 6.7    Liver Function Tests: Recent Labs  Lab 10/14/20 0015  AST 21  ALT 20  ALKPHOS 71  BILITOT 1.1  PROT 6.0*  ALBUMIN 4.1   Recent Labs  Lab 10/14/20 0015  LIPASE 29   No results for input(s): AMMONIA in the last 168 hours.  ABG No results found for: PHART, PCO2ART, PO2ART, HCO3, TCO2, ACIDBASEDEF, O2SAT   Coagulation Profile: Recent Labs  Lab 10/14/20 0015  INR 1.1    Cardiac Enzymes: No results for input(s): CKTOTAL, CKMB, CKMBINDEX, TROPONINI in the last 168 hours.  HbA1C: Hgb A1c MFr Bld  Date/Time Value Ref Range Status  10/14/2020 05:05 AM 5.2 4.8 - 5.6 % Final    Comment:    (  NOTE) Pre diabetes:          5.7%-6.4%  Diabetes:              >6.4%  Glycemic control for   <7.0% adults with diabetes   06/04/2020 04:53 AM 5.1 4.8 - 5.6 % Final    Comment:    (NOTE) Pre diabetes:          5.7%-6.4%  Diabetes:              >6.4%  Glycemic control for   <7.0% adults with diabetes     CBG: No results for input(s): GLUCAP in the last 168 hours.  Review of Systems:   Patient unable to participate in care due to underlying comorbidities  Past Medical History:  He,  has a past medical history of Arthritis, CAD (coronary artery disease), Carotid artery disease (HCC), Cirrhosis of liver (HCC), Dementia (HCC), GERD (gastroesophageal reflux disease), Hepatitis C, Hyperlipidemia, Hypertension, Idiopathic thrombocytopenia (HCC), Memory loss, Myocardial infarction (HCC) (1996), RBBB (right bundle branch block), Right bundle branch block (RBBB), Right tibial fracture (2014), Stroke [facility])  (February 2016), Thrombocytopenia (HCC), TIA (transient ischemic attack), and TIA (transient ischemic attack).   Surgical History:   Past Surgical History:  Procedure Laterality Date  . CORONARY ARTERY BYPASS GRAFT    . EYE SURGERY Bilateral    Cataract Extraction  . FRACTURE SURGERY Right 1981   Rod in right femur  . JOINT REPLACEMENT    . LEG SURGERY  2014   right leg  . PARTIAL KNEE ARTHROPLASTY Right 06/14/2015   Procedure: UNICOMPARTMENTAL KNEE;  Surgeon: Christena Flake, MD;  Location: [facility] ORS;  Service: Orthopedics;  Laterality: Right;  . TIBIA FRACTURE SURGERY Right 2014   Rod in tibia  . UPPER GASTROINTESTINAL ENDOSCOPY       Social History:   reports that he has never smoked. He has never used smokeless tobacco. He reports that he does not drink alcohol and does not use drugs.   Family History:  His family history is negative for Diabetes and Coronary artery disease.   Allergies Allergies  Allergen Reactions  . Aspirin Other (See Comments)    "unknown"  . Aspirin-Dipyridamole Er Other (See Comments)    "unknown"  . Aspirin-Dipyridamole Er     aggrenox  . Ciprofloxacin Other (See Comments)    "burning sensation" Other reaction(s): OTHER  . Contrast Media [Iodinated Diagnostic Agents]     Burning when having mri contrast dye -   . Morphine Other (See Comments)    "unknown" Other reaction(s): OTHER  . Other Other (See Comments)    Other reaction(s): Unknown  Other reaction(s): OTHER Burning when having mri contrast dye -  Other reaction(s): OTHER   . Venlafaxine Other (See Comments)    "unknown" Other reaction(s): OTHER  AKA Effexor Other reaction(s): OTHER      Home Medications  Prior to Admission medications   Medication Sig Start Date End Date Taking? Authorizing Provider  aspirin 81 MG EC tablet Take 81 mg by mouth daily.   Yes [provider]  clopidogrel (PLAVIX) 75 MG tablet Take 1 tablet by mouth daily. 09/22/20  Yes [provider]  Cyanocobalamin (B-12) 1000 MCG TBCR Take 1,000 mcg by mouth daily. 06/05/20  Yes Esaw Grandchild A, DO  ezetimibe (ZETIA) 10 MG tablet Take 1 tablet (10 mg total) by mouth daily. 02/03/20 05/03/20 Yes Gollan, Tollie Pizza, MD  galantamine (RAZADYNE) 12 MG tablet Take 12 mg by mouth 2 (  two) times daily.  02/26/13  Yes [provider]  irbesartan (AVAPRO) 300 MG tablet TAKE 1/2 TABLET EVERYDAY 07/14/20  Yes Gollan, Tollie Pizza, MD  levothyroxine (SYNTHROID, LEVOTHROID) 75 MCG tablet Take 75 mcg by mouth daily. 12/21/16  Yes [provider]  memantine (NAMENDA XR) 21 MG CP24 24 hr capsule Take 21 mg by mouth daily.  04/13/15  Yes [provider]  Multiple Vitamins-Minerals (CENTRUM SILVER) tablet Take 1 tablet by mouth daily.     Yes [provider]  omeprazole (PRILOSEC) 40 MG capsule Take 40 mg by mouth daily. 10/08/17  Yes [provider]  QUEtiapine (SEROQUEL) 25 MG tablet Take 25 mg by mouth daily as needed for agitation. 05/24/20 10/14/20 Yes [provider]  sertraline (ZOLOFT) 50 MG tablet Take 50 mg by mouth daily.  03/11/15  Yes [provider]  simvastatin (ZOCOR) 40 MG tablet TAKE ONE TABLET BY MOUTH AT BEDTIME 09/09/19  Yes Gollan, Tollie Pizza, MD  nitroGLYCERIN (NITROSTAT) 0.4 MG SL tablet Place 1 tablet (0.4 mg total) under the tongue as directed. 10/23/16   Antonieta Iba, MD     Critical care time: 35 minutes       Betsey Holiday, AGACNP-BC Acute Care Nurse Practitioner [facility]   [phone] / [phone] Please see Amion for pager details.

## 2020-10-16 NOTE — Progress Notes (Signed)
ANTICOAGULATION CONSULT NOTE   Pharmacy Consult for heparin infusion Indication: chest pain/ACS/STEMI  Patient Measurements: Height: 5\' 8"  (172.7 cm) Weight: 69.6 kg (153 lb 7 oz) IBW/kg (Calculated) : 68.4 Heparin Dosing Weight: 68 kg  Vital Signs: Temp: 98 F (36.7 C) (05/07 1947) Temp Source: Oral (05/07 1947) BP: 98/70 (05/08 0430) Pulse Rate: 81 (05/08 0430)  Labs: Recent Labs    10/14/20 0015 10/14/20 0214 10/14/20 0505 10/14/20 1035 10/14/20 1636 10/15/20 0505 10/15/20 0940 10/15/20 1734 10/16/20 0506  HGB 12.8*  --  11.7*  --   --  13.5  --   --  13.7  HCT 39.0  --  35.2*  --   --  39.5  --   --  41.0  PLT 73*  --  73*  --   --  73*  --   --  71*  LABPROT 14.4  --   --   --   --   --   --   --   --   INR 1.1  --   --   --   --   --   --   --   --   HEPARINUNFRC  --   --   --   --    < >  --  0.67 0.52 0.41  CREATININE 1.35*  --   --   --   --  1.08  --   --  1.15  TROPONINIHS 65*   < > 3,831* 12,292*  --  4,627*  --   --   --    < > = values in this interval not displayed.    Estimated Creatinine Clearance: 45.4 mL/min (by C-G formula based on SCr of 1.15 mg/dL).   Medical History: Past Medical History:  Diagnosis Date  . Arthritis   . CAD (coronary artery disease)    post CABG; myoview 2009 neg  . Carotid artery disease (HCC)    Mild  . Cirrhosis of liver (HCC)   . Dementia (HCC)   . GERD (gastroesophageal reflux disease)   . Hepatitis C    Inactive  . Hyperlipidemia   . Hypertension   . Idiopathic thrombocytopenia (HCC)   . Memory loss   . Myocardial infarction [facility]) 1996   [place]  . RBBB (right bundle branch block)   . Right bundle branch block (RBBB)   . Right tibial fracture 2014  . Stroke [facility]) February 2016   [facility]  . Thrombocytopenia (HCC)   . TIA (transient ischemic attack)   . TIA (transient ischemic attack)      Assessment: Pt is a 87 y.o. male with med hx CAD post CABG, cirrhosis, GERD, dementia, hypertension,  dyslipidemia, CVA/TIA, who presented to the ER with acute onset of chest pain that woke him up from sleep. Pt baseline thrombocytopenic w/ PLT 73. Heparin was started early this morning and paused an unknown time for an attempted LHC and PCI which was unable to be completed.    0506 2353  HL=0.23   1000 unit bolus,Increase rate  1050 units/hr  0507 0940  HL= 0.67  Therapeutic- continue  0507 1734 HL= 0.52 Therapeutic x 2   0508 0506 HL = 0.41, therapeutic x 3  Goal of Therapy:  Heparin level 0.3-0.7 units/ml Monitor platelets by anticoagulation protocol: Yes     Plan:   HL now therapeutic x 3   Continue heparin infusion at 1050 units/hr  Will continue to check HL and CBC  daily while on heparin.  Per MD note 5/7: Heparin drip to be continued for a total of 48 hours (0730 on 5/8)  Otelia Sergeant, PharmD, [facility] 10/16/2020 6:24 AM

## 2020-10-17 ENCOUNTER — Encounter: Payer: Self-pay | Admitting: Internal Medicine

## 2020-10-17 DIAGNOSIS — I200 Unstable angina: Secondary | ICD-10-CM

## 2020-10-17 DIAGNOSIS — Z7189 Other specified counseling: Secondary | ICD-10-CM

## 2020-10-17 DIAGNOSIS — Z515 Encounter for palliative care: Secondary | ICD-10-CM

## 2020-10-17 DIAGNOSIS — I4891 Unspecified atrial fibrillation: Secondary | ICD-10-CM | POA: Diagnosis not present

## 2020-10-17 DIAGNOSIS — I5033 Acute on chronic diastolic (congestive) heart failure: Secondary | ICD-10-CM | POA: Diagnosis not present

## 2020-10-17 DIAGNOSIS — R410 Disorientation, unspecified: Secondary | ICD-10-CM

## 2020-10-17 LAB — CBC
HCT: 43.3 % (ref 39.0–52.0)
Hemoglobin: 14.7 g/dL (ref 13.0–17.0)
MCH: 30.3 pg (ref 26.0–34.0)
MCHC: 33.9 g/dL (ref 30.0–36.0)
MCV: 89.3 fL (ref 80.0–100.0)
Platelets: 80 10*3/uL — ABNORMAL LOW (ref 150–400)
RBC: 4.85 MIL/uL (ref 4.22–5.81)
RDW: 18 % — ABNORMAL HIGH (ref 11.5–15.5)
WBC: 6.7 10*3/uL (ref 4.0–10.5)
nRBC: 0 % (ref 0.0–0.2)

## 2020-10-17 LAB — BASIC METABOLIC PANEL
Anion gap: 9 (ref 5–15)
BUN: 24 mg/dL — ABNORMAL HIGH (ref 8–23)
CO2: 28 mmol/L (ref 22–32)
Calcium: 9.1 mg/dL (ref 8.9–10.3)
Chloride: 100 mmol/L (ref 98–111)
Creatinine, Ser: 1.52 mg/dL — ABNORMAL HIGH (ref 0.61–1.24)
GFR, Estimated: 45 mL/min — ABNORMAL LOW (ref >60)
Glucose, Bld: 111 mg/dL — ABNORMAL HIGH (ref 70–99)
Potassium: 3.8 mmol/L (ref 3.5–5.1)
Sodium: 137 mmol/L (ref 135–145)

## 2020-10-17 LAB — MAGNESIUM: Magnesium: 2.1 mg/dL (ref 1.7–2.4)

## 2020-10-17 LAB — HEPARIN LEVEL (UNFRACTIONATED): Heparin Unfractionated: 0.63 IU/mL (ref 0.30–0.70)

## 2020-10-17 LAB — GLUCOSE, CAPILLARY: Glucose-Capillary: 110 mg/dL — ABNORMAL HIGH (ref 70–99)

## 2020-10-17 LAB — PHOSPHORUS: Phosphorus: 4.6 mg/dL (ref 2.5–4.6)

## 2020-10-17 MED ORDER — HALOPERIDOL LACTATE 2 MG/ML PO CONC
0.5000 mg | ORAL | Status: DC | PRN
  Filled 2020-10-17: qty 0.3

## 2020-10-17 MED ORDER — HALOPERIDOL 0.5 MG PO TABS
0.5000 mg | ORAL_TABLET | ORAL | Status: DC | PRN
  Filled 2020-10-17: qty 1

## 2020-10-17 MED ORDER — POTASSIUM CHLORIDE CRYS ER 20 MEQ PO TBCR
40.0000 meq | EXTENDED_RELEASE_TABLET | Freq: Once | ORAL | Status: AC
  Administered 2020-10-17: 40 meq via ORAL
  Filled 2020-10-17: qty 2

## 2020-10-17 MED ORDER — GLYCOPYRROLATE 0.2 MG/ML IJ SOLN: 0.2000 mg | INTRAMUSCULAR | Status: DC | PRN

## 2020-10-17 MED ORDER — LORAZEPAM 1 MG PO TABS: 1.0000 mg | ORAL_TABLET | ORAL | Status: DC | PRN

## 2020-10-17 MED ORDER — OXYCODONE-ACETAMINOPHEN 5-325 MG PO TABS
1.0000 | ORAL_TABLET | Freq: Four times a day (QID) | ORAL | Status: DC | PRN
  Administered 2020-10-17: 2 via ORAL
  Filled 2020-10-17: qty 2

## 2020-10-17 MED ORDER — POLYVINYL ALCOHOL 1.4 % OP SOLN
1.0000 [drp] | Freq: Four times a day (QID) | OPHTHALMIC | Status: DC | PRN
  Filled 2020-10-17: qty 15

## 2020-10-17 MED ORDER — LORAZEPAM 2 MG/ML PO CONC: 1.0000 mg | ORAL | Status: DC | PRN

## 2020-10-17 MED ORDER — GLYCOPYRROLATE 1 MG PO TABS
1.0000 mg | ORAL_TABLET | ORAL | Status: DC | PRN
Start: 2020-10-17 — End: 2020-10-19
  Filled 2020-10-17: qty 1

## 2020-10-17 MED ORDER — MORPHINE SULFATE (PF) 2 MG/ML IV SOLN: 1.0000 mg | INTRAVENOUS | Status: DC | PRN

## 2020-10-17 MED ORDER — LORAZEPAM 2 MG/ML IJ SOLN
1.0000 mg | INTRAMUSCULAR | Status: DC | PRN
  Administered 2020-10-17 – 2020-10-19 (×4): 1 mg via INTRAVENOUS
  Filled 2020-10-17 (×4): qty 1

## 2020-10-17 MED ORDER — BIOTENE DRY MOUTH MT LIQD: 15.0000 mL | OROMUCOSAL | Status: DC | PRN

## 2020-10-17 MED ORDER — HALOPERIDOL LACTATE 5 MG/ML IJ SOLN: 0.5000 mg | INTRAMUSCULAR | Status: DC | PRN

## 2020-10-17 NOTE — Progress Notes (Signed)
Progress Note  Patient Name: Alex [name] Date of Encounter: 10/17/2020  Primary Cardiologist: Julien Nordmann, MD  Subjective   Patient very sleepy this morning.  He does open his eyes and responds to yes/no questions.  He is not having any pain.  His family is now at the bedside and note decision for comfort care.  He did have recurrent atrial fibrillation last night though he is unaware of this occurring.  Inpatient Medications    Scheduled Meds:  Continuous Infusions: . sodium chloride Stopped (10/16/20 0346)   PRN Meds: acetaminophen **OR** acetaminophen, antiseptic oral rinse, glycopyrrolate **OR** glycopyrrolate **OR** glycopyrrolate, haloperidol **OR** haloperidol **OR** haloperidol lactate, LORazepam **OR** LORazepam **OR** LORazepam, morphine injection, ondansetron **OR** ondansetron (ZOFRAN) IV, polyvinyl alcohol   Vital Signs    Vitals:   10/17/20 0800 10/17/20 0900 10/17/20 1000 10/17/20 1100  BP: 102/67 106/74 109/68 115/73  Pulse: (!) 55 83 (!) 44 (!) 55  Resp: 11 (!) 8 11 11   Temp: (!) 96.8 F (36 C)     TempSrc: Axillary     SpO2: 98% 99% 99% 98%  Weight:      Height:        Intake/Output Summary (Last 24 hours) at 10/17/2020 1143 Last data filed at 10/17/2020 0800 Gross per 24 hour  Intake 263.01 ml  Output 2025 ml  Net -1761.99 ml   Filed Weights   10/14/20 0245 10/15/20 0154 10/16/20 0500  Weight: 73.1 kg 69.6 kg 70.1 kg    Physical Exam   GEN: Well nourished, well developed, in no acute distress.  HEENT: Grossly normal.  Neck: Supple, no JVD, carotid bruits, or masses. Cardiac: RRR, no murmurs, rubs, or gallops. No clubbing, cyanosis, edema.  Radials 2+, DP/PT 1+ and equal bilaterally.  Respiratory:  Respirations regular and unlabored, clear to auscultation bilaterally. GI: Soft, nontender, nondistended, BS + x 4. MS: no deformity or atrophy. Skin: warm and dry, no rash. Neuro: Patient dozing off frequently.  Limited effort. Psych: Groggy,  opens his eyes and answers yes/no questions.  Disoriented to time and place.  Labs    Chemistry Recent Labs  Lab 10/14/20 0015 10/15/20 0505 10/16/20 0506 10/17/20 0414  NA 139 138 140 137  K 4.0 3.8 3.8 3.8  CL 104 102 106 100  CO2 27 27 25 28   GLUCOSE 128* 96 100* 111*  BUN 27* 24* 26* 24*  CREATININE 1.35* 1.08 1.15 1.52*  CALCIUM 8.7* 9.0 8.6* 9.1  PROT 6.0*  --   --   --   ALBUMIN 4.1  --   --   --   AST 21  --   --   --   ALT 20  --   --   --   ALKPHOS 71  --   --   --   BILITOT 1.1  --   --   --   GFRNONAA 51* >60 >60 45*  ANIONGAP 8 9 9 9      Hematology Recent Labs  Lab 10/15/20 0505 10/16/20 0506 10/17/20 0414  WBC 6.7 6.1 6.7  RBC 4.50 4.51 4.85  HGB 13.5 13.7 14.7  HCT 39.5 41.0 43.3  MCV 87.8 90.9 89.3  MCH 30.0 30.4 30.3  MCHC 34.2 33.4 33.9  RDW 17.7* 18.1* 18.0*  PLT 73* 71* 80*    Cardiac Enzymes  Recent Labs  Lab 10/14/20 0015 10/14/20 0214 10/14/20 0505 10/14/20 1035 10/15/20 0505  TROPONINIHS 65* 593* 3,831* 12,292* 4,627*      BNP  Recent Labs  Lab 10/14/20 0015  BNP 411.8*     Lipids  Lab Results  Component Value Date   CHOL 105 10/14/2020   HDL 36 (L) 10/14/2020   LDLCALC 58 10/14/2020   LDLDIRECT 39.7 08/08/2009   TRIG 57 10/14/2020   CHOLHDL 2.9 10/14/2020    HbA1c  Lab Results  Component Value Date   HGBA1C 5.2 10/14/2020    Radiology    DG Chest Port 1 View  Result Date: 10/14/2020 CLINICAL DATA:  Chest pain. EXAM: PORTABLE CHEST 1 VIEW COMPARISON:  August 19, 2015 FINDINGS: Multiple sternal wires and vascular clips are noted. Chronic appearing increased lung markings are seen. There is mild prominence of the perihilar pulmonary vasculature. There is no evidence of acute infiltrate, pleural effusion or pneumothorax. The cardiac silhouette is mildly enlarged. Degenerative changes seen throughout the thoracic spine. IMPRESSION: 1. Evidence of prior median sternotomy/CABG. 2. Cardiomegaly with mild pulmonary  vascular congestion. Electronically Signed   By: Aram Candela M.D.   On: 10/14/2020 00:53   Telemetry    RSR w/ run of Afib into the 160s from 2308  0216 - Personally Reviewed  Cardiac Studies   2D echo 10/2020: 1. Technically difficult study with poor image quality.   2. Left ventricular ejection fraction, by estimation, is 50 to 55%. The  left ventricle has low normal function. The left ventricle demonstrates  regional wall motion abnormalities (see scoring diagram/findings for  description). Left ventricular diastolic   parameters are consistent with Grade II diastolic dysfunction  (pseudonormalization). There is moderate hypokinesis of the left  ventricular, basal-mid inferior wall and inferoseptal wall.   3. Right ventricular systolic function was not well visualized. The right  ventricular size is normal.   4. The mitral valve is grossly normal. Mild mitral valve regurgitation.  No evidence of mitral stenosis.   5. The aortic valve is tricuspid. Aortic valve regurgitation is trivial.  No aortic stenosis is present. __________   2D echo 05/2020: 1. Left ventricular ejection fraction, by estimation, is 50 to 55%. The  left ventricle has low normal function. The left ventricle has no regional  wall motion abnormalities. The left ventricular internal cavity size was  mildly dilated. There is mild  concentric left ventricular hypertrophy. Left ventricular diastolic  parameters are consistent with Grade I diastolic dysfunction (impaired  relaxation).   2. Right ventricular systolic function is normal. The right ventricular  size is normal. There is mildly elevated pulmonary artery systolic  pressure.   3. Left atrial size was mildly dilated.   4. The mitral valve is grossly normal. Mild to moderate mitral valve  regurgitation.   5. The aortic valve is normal in structure. Aortic valve regurgitation is  not visualized. No aortic stenosis is present. __________   2D echo  12/2019: 1. Left ventricular ejection fraction, by estimation, is 50 to 55%. The  left ventricle has low normal function. The left ventricle has no regional  wall motion abnormalities. There is moderate left ventricular hypertrophy.  Left ventricular diastolic  parameters are consistent with Grade II diastolic dysfunction  (pseudonormalization).   2. Right ventricular systolic function is normal. The right ventricular  size is normal. There is moderately elevated pulmonary artery systolic  pressure.   3. The mitral valve is grossly normal. Mild to moderate mitral valve  regurgitation.   4. The aortic valve is tricuspid. Aortic valve regurgitation is mild.   5. The inferior vena cava is dilated in size with <50%  respiratory  variability, suggesting right atrial pressure of 15 mmHg. __________   Nuclear stress test 08/2015: Pharmacological myocardial perfusion imaging study with no significant  ischemia Basal to apical inferoseptal wall motion abnormality,likely secondary to conduction abnormality  EF estimated at 55% No EKG changes concerning for ischemia at peak stress or in recovery. Baseline EKG with resting ST and T wave abnormality Low risk scan __________   Nuclear stress test 04/2013: No significant ischemia, attenuation artifact noted, no significant wall motion abnormality, EF 57%, equivocal EKG changes, low risk scan.   Patient Profile     87 y.o. male with history of CAD status post CABG in 1996, CVA in 07/2014, epidural hematoma, subdural hematoma, hepatitis C with cirrhosis, esophageal varices, dementia, and hypothyroidism who is being seen today for the evaluation of NSTEMI at the request of Dr. Arville Care.   Assessment & Plan    1.  Non-STEMI/CAD: Status post prior CABG.  High-sensitivity troponin peaked at 12,292.  Initial plan is for left heart catheterization however, in the setting of dementia, patient was unable to cooperate and the case was aborted.  Plan was for  medical management.  He has since been moved to comfort care and medications discontinued.  2.  Paroxysmal atrial fibrillation: Initially developed A. fib with RVR May 7 and he was subsequently placed on amiodarone.  He had recurrent, presumably asymptomatic A. fib overnight.  With change to comfort care, oral medications have been discontinued.  3.  Heart failure with preserved ejection fraction: Appears relatively euvolemic today.  Comfort care as above.  Signed, Nicolasa Ducking, NP  10/17/2020, 11:43 AM    For questions or updates, please contact   Please consult [email or web address] for contact info under Cardiology/STEMI.

## 2020-10-17 NOTE — Progress Notes (Signed)
   10/17/20 1100  Clinical Encounter Type  Visited With Family  Visit Type Initial;Spiritual support;Social support;Critical Care  Referral From Nurse  Consult/Referral To Chaplain   Chaplain responded to a page from nurse, stated the Pt's family could use some support. Family was just told that Pt would be placed on comfort care. Chaplain spoke with Mr. Petrosyan wife and son, who told them they had a deep faith in God (heaven), but they are hurting. Chaplain supported the family in their emotions. Chaplain ministered with emotional support, spiritual support, and prayer. Chaplain will follow-up with family.

## 2020-10-17 NOTE — Progress Notes (Signed)
Transferred to 1C via bed with assistance from charge nurse, after giving report to [employer]

## 2020-10-17 NOTE — Progress Notes (Signed)
PROGRESS NOTE    Alex [name]  QPY:[id number] DOB: Jan 05, 1933 DOA: 10/14/2020 PCP: Danella Penton, MD  CC.  Chest pain. Brief Narrative:  Alex [name] a 87 y.o.Caucasian malewith medical history significant forcoronary artery disease status post CABG, carotid artery disease, liver cirrhosis, GERD, dementia, hypertension, dyslipidemia, CVA/TIA, who presented to the ER with acute onset of chest pain that woke him up from sleep.Upon arriving the emergency room, EKG showed atrial fibrillation with rapid ventricular response, peak troponin 3831.Patient is seen by cardiology, cardiac cath was deferred due to multiple medical problems and a poor prognosis.  5/9.  Seen by palliative care, transition her to comfort care.   Assessment & Plan:   Active Problems:   Essential hypertension   Delirium with dementia   Chest pain   NSTEMI (non-ST elevated myocardial infarction) (HCC)   Paroxysmal atrial fibrillation with rapid ventricular response (HCC)   Acute on chronic diastolic CHF (congestive heart failure) (HCC)   Chronic kidney disease, stage 3a (HCC)   Thrombocytopenia (HCC)   Patient had recurrent atrial fibrillation with rapid rate response.  He also had a recurrent chest pain.  After long discussion with the family, decision is made to go with comfort care.  We will transfer patient to medical floor, patient can be transferred to inpatient hospice when bed is available.   DVT prophylaxis:  Code Status:  Family Communication:  Disposition Plan:  .   Status is: Inpatient  Remains inpatient appropriate because:Inpatient level of care appropriate due to severity of illness   Dispo: The patient is from: Home              Anticipated d/c is to: hospice              Patient currently is not medically stable to d/c.   Difficult to place patient No        I/O last 3 completed shifts: In: 557.7 [I.V.:557.7] Out: 2225 [Urine:2225] Total I/O In: 10.6 [I.V.:10.6] Out:  -         Subjective: Patient is confused at the baseline, seem to be comfortable. He denies any short of breath, no chest pain or palpitation. No dysuria hematuria. No fever or chills. No headache or dizziness. No chest pain.  Objective: Vitals:   10/17/20 1000 10/17/20 1100 10/17/20 1200 10/17/20 1300  BP: 109/68 115/73    Pulse: (!) 44 (!) 55 (!) 56 (!) 57  Resp: 11 11 16 11   Temp:      TempSrc:      SpO2: 99% 98% 96% 96%  Weight:      Height:        Intake/Output Summary (Last 24 hours) at 10/17/2020 1334 Last data filed at 10/17/2020 0800 Gross per 24 hour  Intake 242.01 ml  Output 1475 ml  Net -1232.99 ml   Filed Weights   10/14/20 0245 10/15/20 0154 10/16/20 0500  Weight: 73.1 kg 69.6 kg 70.1 kg    Examination:  General exam: Appears calm and comfortable  Respiratory system: Clear to auscultation. Respiratory effort normal. Cardiovascular system: Irregularly irregular. No JVD, murmurs, rubs, gallops or clicks. No pedal edema. Gastrointestinal system: Abdomen is nondistended, soft and nontender. No organomegaly or masses felt. Normal bowel sounds heard. Central nervous system: Alert and oriented x1. No focal neurological deficits. Extremities: Symmetric 5 x 5 power. Skin: No rashes, lesions or ulcers     Data Reviewed: I have personally reviewed following labs and imaging studies  CBC: Recent Labs  Lab 10/14/20 0015 10/14/20 0505 10/15/20 0505 10/16/20 0506 10/17/20 0414  WBC 5.6 5.6 6.7 6.1 6.7  NEUTROABS 3.8  --   --   --   --   HGB 12.8* 11.7* 13.5 13.7 14.7  HCT 39.0 35.2* 39.5 41.0 43.3  MCV 91.1 91.2 87.8 90.9 89.3  PLT 73* 73* 73* 71* 80*   Basic Metabolic Panel: Recent Labs  Lab 10/14/20 0015 10/15/20 0505 10/16/20 0506 10/17/20 0414  NA 139 138 140 137  K 4.0 3.8 3.8 3.8  CL 104 102 106 100  CO2 27 27 25 28   GLUCOSE 128* 96 100* 111*  BUN 27* 24* 26* 24*  CREATININE 1.35* 1.08 1.15 1.52*  CALCIUM 8.7* 9.0 8.6* 9.1  MG  --   2.1 2.0 2.1  PHOS  --   --  3.8 4.6   GFR: Estimated Creatinine Clearance: 34.4 mL/min (A) (by C-G formula based on SCr of 1.52 mg/dL (H)). Liver Function Tests: Recent Labs  Lab 10/14/20 0015  AST 21  ALT 20  ALKPHOS 71  BILITOT 1.1  PROT 6.0*  ALBUMIN 4.1   Recent Labs  Lab 10/14/20 0015  LIPASE 29   No results for input(s): AMMONIA in the last 168 hours. Coagulation Profile: Recent Labs  Lab 10/14/20 0015  INR 1.1   Cardiac Enzymes: No results for input(s): CKTOTAL, CKMB, CKMBINDEX, TROPONINI in the last 168 hours. BNP (last 3 results) No results for input(s): PROBNP in the last 8760 hours. HbA1C: No results for input(s): HGBA1C in the last 72 hours. CBG: Recent Labs  Lab 10/16/20 0333  GLUCAP 110*   Lipid Profile: No results for input(s): CHOL, HDL, LDLCALC, TRIG, CHOLHDL, LDLDIRECT in the last 72 hours. Thyroid Function Tests: Recent Labs    10/16/20 0506  TSH 8.342*   Anemia Panel: No results for input(s): VITAMINB12, FOLATE, FERRITIN, TIBC, IRON, RETICCTPCT in the last 72 hours. Sepsis Labs: No results for input(s): PROCALCITON, LATICACIDVEN in the last 168 hours.  Recent Results (from the past 240 hour(s))  Resp Panel by RT-PCR (Flu A&B, Covid) Nasopharyngeal Swab     Status: None   Collection Time: 10/14/20 12:29 AM   Specimen: Nasopharyngeal Swab; Nasopharyngeal(NP) swabs in vial transport medium  Result Value Ref Range Status   SARS Coronavirus 2 by RT PCR NEGATIVE NEGATIVE Final    Comment: (NOTE) SARS-CoV-2 target nucleic acids are NOT DETECTED.  The SARS-CoV-2 RNA is generally detectable in upper respiratory specimens during the acute phase of infection. The lowest concentration of SARS-CoV-2 viral copies this assay can detect is 138 copies/mL. A negative result does not preclude SARS-Cov-2 infection and should not be used as the sole basis for treatment or other patient management decisions. A negative result may occur with  improper  specimen collection/handling, submission of specimen other than nasopharyngeal swab, presence of viral mutation(s) within the areas targeted by this assay, and inadequate number of viral copies(<138 copies/mL). A negative result must be combined with clinical observations, patient history, and epidemiological information. The expected result is Negative.  Fact Sheet for Patients:  12/14/20  Fact Sheet for Healthcare Providers:  BloggerCourse.com  This test is no t yet approved or cleared by the [place] FDA and  has been authorized for detection and/or diagnosis of SARS-CoV-2 by FDA under an Emergency Use Authorization (EUA). This EUA will remain  in effect (meaning this test can be used) for the duration of the COVID-19 declaration under Section 564(b)(1) of the Act, 21 U.S.C.section 360bbb-3(b)(1),  unless the authorization is terminated  or revoked sooner.       Influenza A by PCR NEGATIVE NEGATIVE Final   Influenza B by PCR NEGATIVE NEGATIVE Final    Comment: (NOTE) The Xpert Xpress SARS-CoV-2/FLU/RSV plus assay is intended as an aid in the diagnosis of influenza from Nasopharyngeal swab specimens and should not be used as a sole basis for treatment. Nasal washings and aspirates are unacceptable for Xpert Xpress SARS-CoV-2/FLU/RSV testing.  Fact Sheet for Patients: BloggerCourse.com  Fact Sheet for Healthcare Providers: SeriousBroker.it  This test is not yet approved or cleared by the [place] FDA and has been authorized for detection and/or diagnosis of SARS-CoV-2 by FDA under an Emergency Use Authorization (EUA). This EUA will remain in effect (meaning this test can be used) for the duration of the COVID-19 declaration under Section 564(b)(1) of the Act, 21 U.S.C. section 360bbb-3(b)(1), unless the authorization is terminated or revoked.  Performed at  [facility], [address] [place], [place] [address]   MRSA PCR Screening     Status: None   Collection Time: 10/16/20  3:37 AM   Specimen: Nasopharyngeal  Result Value Ref Range Status   MRSA by PCR NEGATIVE NEGATIVE Final    Comment:        The GeneXpert MRSA Assay (FDA approved for NASAL specimens only), is one component of a comprehensive MRSA colonization surveillance program. It is not intended to diagnose MRSA infection nor to guide or monitor treatment for MRSA infections. Performed at [facility], [address], [place], [place] [address]          Radiology Studies: No results found.      Scheduled Meds: Continuous Infusions: . sodium chloride Stopped (10/16/20 0346)     LOS: 2 days    Time spent: 32 minutes, more than 50% time involved in direct patient care.    Marrion Coy, MD [facility]   To contact the attending provider between 7A-7P or the covering provider during after hours 7P-7A, please log into the web site [email or web address] and access using universal  password for that web site. If you do not have the password, please call the hospital operator.  10/17/2020, 1:34 PM

## 2020-10-17 NOTE — Progress Notes (Signed)
PHARMACY CONSULT NOTE  Pharmacy Consult for Electrolyte Monitoring and Replacement   Recent Labs: Potassium (mmol/L)  Date Value  10/17/2020 3.8  04/26/2013 3.7   Magnesium (mg/dL)  Date Value  30/16/0109 2.1  04/26/2013 1.8   Calcium (mg/dL)  Date Value  32/35/5732 9.1   Calcium, Total (mg/dL)  Date Value  20/25/4270 8.9   Albumin (g/dL)  Date Value  62/37/6283 4.1  08/17/2012 4.2   Phosphorus (mg/dL)  Date Value  15/17/6160 4.6   Sodium (mmol/L)  Date Value  10/17/2020 137  04/26/2013 138   Assessment: 87 y.o. malewith med hx CAD post CABG, cirrhosis, GERD, dementia, hypertension, dyslipidemia, CVA/TIA, who presented to the ER with acute onset of chest pain that woke him up from sleep. He is noted to be baseline thrombocytopenic. There was an attempted LHC and PCI on 10/14/20 which was unable to be completed. Heparin was continued for 48 hours, however, overnight he developed afib/RVR on PO amiodarone and diltiazem drip and required transfer to the step-down unit. Pharmacy is asked to follow and replace electrolytes as needed.  Goal of Therapy:  Potassium 4.0 - 5.1 mmol/L Magnesium 2.0 - 2.4 mg/dL All Other Electrolytes WNL  Plan:   K 3.8 - will replace with PO KCl x1  Re-check electrolytes with AM labs  Raiford Noble, PharmD Pharmacy Resident  10/17/2020 6:27 AM

## 2020-10-17 NOTE — Progress Notes (Signed)
NAME:  Alex [name], MRN:  [id number], DOB:  08-02-1932, LOS: 2 ADMISSION DATE:  10/14/2020, CONSULTATION DATE:  10/16/20  REFERRING MD:  Dr. Arville CareMansy, CHIEF COMPLAINT:  Chest pain   History of Present Illness:  87 yo male [facility] from [facility] via EMS due to reports of chest pain that woke him up from sleep. Per ED documentation wife gave the patient SLG nitro x 3 without relief. EMS noted the patient to be in A-fib RVR in the 160's. Patient received 324 ASA, 5 mg of metoprolol & 500 mL of NS. Per family patient does not have a history of Atrial Fibrillation. ED course: Initial EKG showed NSR with RBBB. Upon arrival to ED vitals were stable. The patient received IVF, IV lasix 20 mg, IV diltiazem 10 mg and was placed on a heparin drip. [facility]. Appears patient's chest pain resolved after arrival to hospital. Significant Labs: AKI with BUN/Cr: 27/1.35, Elevated BNP: 411.8, Troponin: 65 > 593 > 3831 > 12,292 > 4,627.  Hospital Course: Patient was admitted to PCU, [facility] for N-Stemi and possible A-fib. Consideration for LHC which was ultimately deferred due to underlying dementia and inability to follow commands. Troponin peaked at 12,292, being medically managed. Overnight the patient developed Atrial Fibrillation RVR, and was given Diltiazem 5 mg x 2, followed by a diltiazem drip with 250 mL IVF bolus. Per documentation the patient also received 0.5 mg of dilaudid for chest pain, 40 meq of K+. BP became marginal with diltiazem and the drip was stopped, Amiodarone bolus given followed by an amiodarone drip. This was also stopped due to hypotension with BP 76/53 (62). Amiodarone was stopped and IVF bolus initiated.  PCCM consulted and patient transferred due to anticipated need of vasopressors as well as Amiodarone drip. Pertinent  Medical History  CAD s/p CABG 1996 Cirrhosis Dementia HTN Dyslipidemia CVA/TIA  Significant Hospital  Events: Including procedures, antibiotic start and stop dates in addition to other pertinent events   . 10/14/20 - Admitted to PCU with N-STEMI & arrythmia . 10/16/20 - overnight developed AF RVR, received amiodarone bolus and transferred to ICU . 10/17/20- overnight with A-fib /w RVR (HR 150-160's), given 5 mg Lopressor, placed on Cardizem gtt but became hypotensive and converted to SB.  Palliative Care at bedside, plan to transition to Comfort Measures  Interim History / Subjective:  -Overnight with A-fib w/ RVR, received 5 mg Lopressor, along with Cardizem gtt -Cardizem stopped as developed Hypotension and converted to Sinus Bradycardia -BP soft, but not requiring Vasopressors, remains in SB -Urine output 2L yesterday (-2.6 L since admit) with 20 mg IV Lasix BID -Palliative Care at bedside, plan to transition to COMFORT MEASURES -PCCM will sign off   Objective   Blood pressure 102/67, pulse (!) 55, temperature (!) 96.8 F (36 C), temperature source Axillary, resp. rate 11, height 5\' 8"  (1.727 m), weight 70.1 kg, SpO2 98 %.        Intake/Output Summary (Last 24 hours) at 10/17/2020 0901 Last data filed at 10/17/2020 0800 Gross per 24 hour  Intake 267.74 ml  Output 2025 ml  Net -1757.26 ml   Filed Weights   10/14/20 0245 10/15/20 0154 10/16/20 0500  Weight: 73.1 kg 69.6 kg 70.1 kg    Examination: General: Acute on chronically ill-appearing male, laying in bed, on minimal supplemental O2, no acute distress HEENT: Atraumatic, normocephalic, neck supple, no JVD Neuro: Sleeping, arouses to voice, moves all extremities,  pupils PERRLA CV: Bradycardia, regular rhythm, S1-S2, no murmurs, rubs, gallops, 2+ radial pulses, 1+ DP pulses Pulm: Clear diminished to auscultation bilaterally, even, nonlabored, normal effort GI: Soft, nontender, nondistended, no guarding rebound tenderness, bowel sounds positive x4 Skin: Warm and dry.  No obvious rashes, lesions, ulcerations Extremities: Warm and dry.,   No deformities, no edema.  Labs/imaging that I have personally reviewed  (right click and "Reselect all SmartList Selections" daily)  Labs 10/17/20: glucose 111, BUN 24, Cr. 1.52  CXR 10/14/20: cardiomegaly & mild pulmonary vascular congestion Resolved Hospital Problem list   AKI from admission has resolved  Assessment & Plan:  Paroxysmal Atrial Fibrillation with Rapid Ventricular Response CAD s/p CABG with N-STEMI Acute on Chronic HFpEF: LVEF 50-55% with G2DD  PMHx of Hypertension & Hyperlipidemia Reports from EMS that patient was in AFib RVR in field, on arrival to ED converted back to NSR. Overnight developed A-fib RVR, received dilt IVP, dilt drip that was ultimately stopped, amio bolus & amio drip that was stopped due to hypotension. -Continuous cardiac monitoring -Maintain MAP >65 -Vasopressors if needed to maintain MAP goal -Cardiology following, appreciate input -Continue Heparin gtt (completed IV heparin drip for NSTEMI, but remanis due to A-fib) -CHADS2VASc score of 5 (CHF, HTN, agex2, vascular disease) -Continue 400 mg Amiodarone BID -Diuresis as BP and renal function permits (currently on IV Lasix 20 mg BID) -TSH 8.342, continue Levothyroxine -Unable to complete Left Heart Cath as pt was unable to follow commands during procedure, procedure aborted -Medical management for NSTEMI: ASA, Plavix -Beta blocker currently on hold due to Bradycardia -Hold Irbesartan due to soft BP -Continue home regimen: Lipitor & Zetia  Thrombocytopenia Monitor for S/Sx of bleeding Trend CBC Heparin drip for Anticoagulation/VTE Prophylaxis  Transfuse for Hgb <7 Transfuse platelets for platelet count <50 & active bleeding  CKD Stage IIIa -Monitor I&O's / urinary output -Follow BMP -Ensure adequate renal perfusion -Avoid nephrotoxic agents as able -Replace electrolytes as indicated -Follow closely with Diuresis     Palliative Care at bedside, plan is for transition to COMFORT  MEAURES  Best practice (right click and "Reselect all SmartList Selections" daily)  Diet:  Oral Pain/Anxiety/Delirium protocol (if indicated): No VAP protocol (if indicated): Not indicated DVT prophylaxis: Systemic AC GI prophylaxis: N/A Glucose control:  SSI No Central venous access:  N/A Arterial line:  N/A Foley:  N/A Mobility:  OOB  PT consulted: N/A Last date of multidisciplinary goals of care discussion 10/17/20 Code Status:  full code Disposition: SDU  Pt's wife and son Gery Pray updated at bedside by myself and Palliative Care NP Lillia Carmel.  Plan is to transition to COMFORT MEASURES.  Labs   CBC: Recent Labs  Lab 10/14/20 0015 10/14/20 0505 10/15/20 0505 10/16/20 0506 10/17/20 0414  WBC 5.6 5.6 6.7 6.1 6.7  NEUTROABS 3.8  --   --   --   --   HGB 12.8* 11.7* 13.5 13.7 14.7  HCT 39.0 35.2* 39.5 41.0 43.3  MCV 91.1 91.2 87.8 90.9 89.3  PLT 73* 73* 73* 71* 80*    Basic Metabolic Panel: Recent Labs  Lab 10/14/20 0015 10/15/20 0505 10/16/20 0506 10/17/20 0414  NA 139 138 140 137  K 4.0 3.8 3.8 3.8  CL 104 102 106 100  CO2 27 27 25 28   GLUCOSE 128* 96 100* 111*  BUN 27* 24* 26* 24*  CREATININE 1.35* 1.08 1.15 1.52*  CALCIUM 8.7* 9.0 8.6* 9.1  MG  --  2.1 2.0 2.1  PHOS  --   --  3.8 4.6   GFR: Estimated Creatinine Clearance: 34.4 mL/min (A) (by C-G formula based on SCr of 1.52 mg/dL (H)). Recent Labs  Lab 10/14/20 0505 10/15/20 0505 10/16/20 0506 10/17/20 0414  WBC 5.6 6.7 6.1 6.7    Liver Function Tests: Recent Labs  Lab 10/14/20 0015  AST 21  ALT 20  ALKPHOS 71  BILITOT 1.1  PROT 6.0*  ALBUMIN 4.1   Recent Labs  Lab 10/14/20 0015  LIPASE 29   No results for input(s): AMMONIA in the last 168 hours.  ABG No results found for: PHART, PCO2ART, PO2ART, HCO3, TCO2, ACIDBASEDEF, O2SAT   Coagulation Profile: Recent Labs  Lab 10/14/20 0015  INR 1.1    Cardiac Enzymes: No results for input(s): CKTOTAL, CKMB, CKMBINDEX, TROPONINI in the  last 168 hours.  HbA1C: Hgb A1c MFr Bld  Date/Time Value Ref Range Status  10/14/2020 05:05 AM 5.2 4.8 - 5.6 % Final    Comment:    (NOTE) Pre diabetes:          5.7%-6.4%  Diabetes:              >6.4%  Glycemic control for   <7.0% adults with diabetes   06/04/2020 04:53 AM 5.1 4.8 - 5.6 % Final    Comment:    (NOTE) Pre diabetes:          5.7%-6.4%  Diabetes:              >6.4%  Glycemic control for   <7.0% adults with diabetes     CBG: No results for input(s): GLUCAP in the last 168 hours.  Review of Systems:   Patient unable to participate in care due to underlying comorbidities  Past Medical History:  He,  has a past medical history of Arthritis, CAD (coronary artery disease), Carotid artery disease (HCC), Cirrhosis of liver (HCC), Dementia (HCC), GERD (gastroesophageal reflux disease), Hepatitis C, Hyperlipidemia, Hypertension, Idiopathic thrombocytopenia (HCC), Memory loss, Myocardial infarction (HCC) (1996), RBBB (right bundle branch block), Right bundle branch block (RBBB), Right tibial fracture (2014), Stroke [facility]) (February 2016), Thrombocytopenia (HCC), TIA (transient ischemic attack), and TIA (transient ischemic attack).   Surgical History:   Past Surgical History:  Procedure Laterality Date  . CORONARY ARTERY BYPASS GRAFT    . EYE SURGERY Bilateral    Cataract Extraction  . FRACTURE SURGERY Right 1981   Rod in right femur  . JOINT REPLACEMENT    . LEG SURGERY  2014   right leg  . PARTIAL KNEE ARTHROPLASTY Right 06/14/2015   Procedure: UNICOMPARTMENTAL KNEE;  Surgeon: Christena Flake, MD;  Location: [facility] ORS;  Service: Orthopedics;  Laterality: Right;  . TIBIA FRACTURE SURGERY Right 2014   Rod in tibia  . UPPER GASTROINTESTINAL ENDOSCOPY       Social History:   reports that he has never smoked. He has never used smokeless tobacco. He reports that he does not drink alcohol and does not use drugs.   Family History:  His family history is negative for  Diabetes and Coronary artery disease.   Allergies Allergies  Allergen Reactions  . Aspirin Other (See Comments)    "unknown"  . Aspirin-Dipyridamole Er Other (See Comments)    "unknown"  . Aspirin-Dipyridamole Er     aggrenox  . Ciprofloxacin Other (See Comments)    "burning sensation" Other reaction(s): OTHER  . Contrast Media [Iodinated Diagnostic Agents]     Burning when having mri contrast dye -   . Morphine Other (See Comments)    "  unknown" Other reaction(s): OTHER  . Other Other (See Comments)    Other reaction(s): Unknown  Other reaction(s): OTHER Burning when having mri contrast dye -  Other reaction(s): OTHER   . Venlafaxine Other (See Comments)    "unknown" Other reaction(s): OTHER  AKA Effexor Other reaction(s): OTHER      Home Medications  Prior to Admission medications   Medication Sig Start Date End Date Taking? Authorizing Provider  aspirin 81 MG EC tablet Take 81 mg by mouth daily.   Yes [provider]  clopidogrel (PLAVIX) 75 MG tablet Take 1 tablet by mouth daily. 09/22/20  Yes [provider]  Cyanocobalamin (B-12) 1000 MCG TBCR Take 1,000 mcg by mouth daily. 06/05/20  Yes Esaw Grandchild A, DO  ezetimibe (ZETIA) 10 MG tablet Take 1 tablet (10 mg total) by mouth daily. 02/03/20 05/03/20 Yes Gollan, Tollie Pizza, MD  galantamine (RAZADYNE) 12 MG tablet Take 12 mg by mouth 2 (two) times daily.  02/26/13  Yes [provider]  irbesartan (AVAPRO) 300 MG tablet TAKE 1/2 TABLET EVERYDAY 07/14/20  Yes Gollan, Tollie Pizza, MD  levothyroxine (SYNTHROID, LEVOTHROID) 75 MCG tablet Take 75 mcg by mouth daily. 12/21/16  Yes [provider]  memantine (NAMENDA XR) 21 MG CP24 24 hr capsule Take 21 mg by mouth daily.  04/13/15  Yes [provider]  Multiple Vitamins-Minerals (CENTRUM SILVER) tablet Take 1 tablet by mouth daily.     Yes [provider]  omeprazole (PRILOSEC) 40 MG capsule Take 40 mg by mouth daily. 10/08/17  Yes  [provider]  QUEtiapine (SEROQUEL) 25 MG tablet Take 25 mg by mouth daily as needed for agitation. 05/24/20 10/14/20 Yes [provider]  sertraline (ZOLOFT) 50 MG tablet Take 50 mg by mouth daily.  03/11/15  Yes [provider]  simvastatin (ZOCOR) 40 MG tablet TAKE ONE TABLET BY MOUTH AT BEDTIME 09/09/19  Yes Gollan, Tollie Pizza, MD  nitroGLYCERIN (NITROSTAT) 0.4 MG SL tablet Place 1 tablet (0.4 mg total) under the tongue as directed. 10/23/16   Antonieta Iba, MD     Critical care time: 35 minutes      Harlon Ditty, AGACNP-BC [facility] Prefer epic messenger for cross cover needs If after hours, please call E-link

## 2020-10-17 NOTE — Progress Notes (Signed)
ANTICOAGULATION CONSULT NOTE   Pharmacy Consult for heparin infusion Indication: afib/RVR  Patient Measurements: Height: 5\' 8"  (172.7 cm) Weight: 70.1 kg (154 lb 8.7 oz) IBW/kg (Calculated) : 68.4 Heparin Dosing Weight: 68 kg  Vital Signs: Temp: 98.1 F (36.7 C) (05/09 0000) Temp Source: Oral (05/09 0000) BP: 91/66 (05/09 0315) Pulse Rate: 59 (05/09 0315)  Labs: Recent Labs    10/14/20 1035 10/14/20 1636 10/15/20 0505 10/15/20 0940 10/16/20 0506 10/16/20 1746 10/17/20 0414  HGB  --    < > 13.5  --  13.7  --  14.7  HCT  --   --  39.5  --  41.0  --  43.3  PLT  --   --  73*  --  71*  --  80*  HEPARINUNFRC  --    < >  --    < > 0.41 0.51 0.63  CREATININE  --   --  1.08  --  1.15  --  1.52*  TROPONINIHS 12/17/20*  --  4,627*  --   --   --   --    < > = values in this interval not displayed.    Estimated Creatinine Clearance: 34.4 mL/min (A) (by C-G formula based on SCr of 1.52 mg/dL (H)).   Medical History: Past Medical History:  Diagnosis Date  . Arthritis   . CAD (coronary artery disease)    post CABG; myoview 2009 neg  . Carotid artery disease (HCC)    Mild  . Cirrhosis of liver (HCC)   . Dementia (HCC)   . GERD (gastroesophageal reflux disease)   . Hepatitis C    Inactive  . Hyperlipidemia   . Hypertension   . Idiopathic thrombocytopenia (HCC)   . Memory loss   . Myocardial infarction [facility]) 1996   [place]  . RBBB (right bundle branch block)   . Right bundle branch block (RBBB)   . Right tibial fracture 2014  . Stroke [facility]) February 2016   [facility]  . Thrombocytopenia (HCC)   . TIA (transient ischemic attack)   . TIA (transient ischemic attack)      Assessment: Pt is a 87 y.o. male with med hx CAD post CABG, cirrhosis, GERD, dementia, hypertension, dyslipidemia, CVA/TIA, who presented to the ER with acute onset of chest pain that woke him up from sleep. He is noted to be baseline thrombocytopenic. There was an attempted LHC and PCI on 10/14/20 which was  unable to be completed. Heparin was continued for 48 hours, however, overnight he developed afib/RVR and required transfer to the step-down unit. H&H wnl, platelets while low are stable.  5/8 1746 HL 0.51 5/9 0414 HL 0.63, therapeutic x 2  Goal of Therapy:  Heparin level 0.3-0.7 units/ml Monitor platelets by anticoagulation protocol: Yes   Plan:   Continue heparin infusion at 1050 units/hr   Since HL still trending up, will recheck HL in 8 hours to confirm HL does not go supratherapeutic  CBC daily while on heparin  7/9, PharmD, [facility] 10/17/2020 5:29 AM

## 2020-10-17 NOTE — Progress Notes (Signed)
Atrial Fibrillation with Rapid Ventricular Response Patient's rate increased into the 150's-160's, upon bedside assessment patient appears comfortable and not in distress but fidgety in bed. He has received 400 mg of amiodarone PO x 2 in the last 24 hours.  - 5 mg of lopressor given x 1 with short lived effect, BP holding - initiate Diltiazem drip with bolus > monitor BP as previously this has dropped his BP - start peripheral vasopressors PRN to maintain MAP > 65 - continue to monitor patient closely.   Cheryll Cockayne Rust-Chester, AGACNP-BC Acute Care Nurse Practitioner [facility]   [phone] / [phone] Please see Amion for pager details.

## 2020-10-17 NOTE — TOC Initial Note (Signed)
Transition of Care [facility]) - Initial/Assessment Note    Patient Details  Name: Alex [name] MRN: [id number] Date of Birth: 09/28/1932  Transition of Care [facility]) CM/SW Contact:    Marina Goodell Phone Number: [phone] 10/17/2020, 11:29 AM  Clinical Narrative:                  Patient presents to [facility] due to chest pain.  Main contact is Alex, Alex [name] (Spouse) [phone]. Palliative Care NP spoke with patient's spouse Alex [name] and she has agreed to transition the patient to comfort care measures. TOC will continue to follow.        Patient Goals and CMS Choice        Expected Discharge Plan and Services                                                Prior Living Arrangements/Services                       Activities of Daily Living Home Assistive Devices/Equipment: Alex [name] (specify type) ADL Screening (condition at time of admission) Patient's cognitive ability adequate to safely complete daily activities?: No Is the patient deaf or have difficulty hearing?: Yes Does the patient have difficulty seeing, even when wearing glasses/contacts?: No Does the patient have difficulty concentrating, remembering, or making decisions?: Yes Patient able to express need for assistance with ADLs?: Yes Does the patient have difficulty dressing or bathing?: Yes Independently performs ADLs?: No Communication: Independent Dressing (OT): Needs assistance Is this a change from baseline?: Pre-admission baseline Grooming: Needs assistance Is this a change from baseline?: Pre-admission baseline Feeding: Independent Bathing: Needs assistance Is this a change from baseline?: Pre-admission baseline Toileting: Needs assistance Is this a change from baseline?: Pre-admission baseline In/Out Bed: Independent Walks in Home: Independent Does the patient have difficulty walking or climbing stairs?: No Weakness of Legs: None Weakness of Arms/Hands: None  Permission  Sought/Granted                  Emotional Assessment              Admission diagnosis:  Unstable angina pectoris (HCC) [I20.0] Atrial fibrillation with rapid ventricular response (HCC) [I48.91] Chest pain [R07.9] NSTEMI (non-ST elevated myocardial infarction) [facility]) [I21.4] Patient Active Problem List   Diagnosis Date Noted  . Chest pain 10/14/2020  . NSTEMI (non-ST elevated myocardial infarction) (HCC) 10/14/2020  . Paroxysmal atrial fibrillation with rapid ventricular response (HCC) 10/14/2020  . Acute on chronic diastolic CHF (congestive heart failure) (HCC) 10/14/2020  . Chronic kidney disease, stage 3a (HCC) 10/14/2020  . Thrombocytopenia (HCC) 10/14/2020  . Delirium with dementia 06/03/2020  . History of subdural hematoma 05/16/20  06/03/2020  . Syncope 06/03/2020  . H/O Epidural hematoma 04/2020 (HCC)  06/03/2020  . Hx of CABG 06/03/2020  . Syncope and collapse 06/03/2020  . Elevated troponin 06/03/2020  . Hypotension 06/03/2020  . AKI (acute kidney injury) (HCC) 06/03/2020  . Acquired hypothyroidism 12/21/2016  . Angina pectoris (HCC)   . Pain in the chest   . History of stroke   . Essential hypertension   . Status post right partial knee replacement 06/14/2015  . CVA (cerebral vascular accident) (HCC) 11/05/2014  . History of recent fall 05/10/2014  . Broken nose 05/10/2014  . Hepatic cirrhosis due to chronic hepatitis  C infection (HCC) 11/09/2013  . Chronic viral hepatitis C (HCC) 01/29/2013  . Malaise 01/07/2012  . Dizziness 12/15/2010  . Carotid stenosis 07/04/2009  . PALPITATIONS 03/23/2009  . Hyperlipidemia 12/26/2008  . HYPERTENSION, BENIGN 12/26/2008  . CAD (coronary artery disease) 12/26/2008   PCP:  Danella Penton, MD Pharmacy:   [facility] [place] [place] - [address] [place], [address] [place], [address] [place]  [address] Phone: [phone] Fax: [phone]  [id number] - [place], [place] - [address] [place] [address] Phone: [phone] Fax: [phone]  [facility] - [place], [place] - [address] [place] [place] [address] Phone: [phone] Fax: [phone]     Social Determinants of Health (SDOH) Interventions    Readmission Risk Interventions No flowsheet data found.

## 2020-10-17 NOTE — Progress Notes (Signed)
[facility] Room IC05 [facility]) Hospital Liaison RN note:  Received request from Lillia Carmel, NP for family interest in [facility]osp[facility]. Chart reviewed and eligibility was approved. Spoke with spouse, Artelia Laroche at bedside to confirm interest and explain services. She verbalized understanding and all questions were answered. Unfortunately, [facility]osp[facility] Home is not able to offer a room today. Hospital care team is aware. [facility] Liaison will continue to follow for room availability.  Please call with any hospice related questions or concerns.  Thank you for the opportunity to participate in this patient's care.  Cyndra Numbers, RN [facility] Liaison [phone]

## 2020-10-17 NOTE — Consult Note (Signed)
Consultation Note Date: 10/17/2020   Patient Name: Alex [name]  DOB: October 07, 1932  MRN: [id number]  Age / Sex: 87 y.o., male  PCP: Rusty Aus, MD Referring Physician: Sharen Hones, MD  Reason for Consultation: Establishing goals of care and Hospice Evaluation  HPI/Patient Profile: 87 y.o. male  with past medical history of CAD SP CABG, carotid artery disease, liver cirrhosis, dementia, GERD, HTN/HLD, CVA/TIA, arthritis admitted on 10/14/2020 with paroxysmal A. fib with RVR, CAD, NSTEMI.   Clinical Assessment and Goals of Care: I have reviewed medical records including EPIC notes, labs and imaging, received report from attending and bedside nursing staff, examined the patient and met at bedside with wife and son to discuss diagnosis prognosis, [place], EOL wishes, disposition and options.  I introduced Palliative Medicine as specialized medical care for people living with serious illness. It focuses on providing relief from the symptoms and stress of a serious illness.   We discussed a brief life review of the patient.  Mrs. 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  [place].  He has a sense of humor.  We talked about his acute and chronic health concerns, and the treatment plan so far.  We discussed current illness and what it means in the larger context of on-going co-morbidities.  Natural disease trajectory and expectations at EOL were discussed.  I attempted to elicit values and goals of care important to the patient.    The difference between aggressive medical intervention and comfort care was considered in light of the patient's goals of care.   Advanced directives, concepts specific to code status, artifical feeding and hydration, and rehospitalization were considered and discussed.  We talk in detail about the differences between hospice and palliative care.  Hospice and  Palliative Care services outpatient were explained and offered.  Family elects comfort and dignity at end-of-life, let nature take its course at residential hospice, [facility]. Questions and concerns were addressed.  The family was encouraged to call with questions or concerns.   Conference attending, bedside nursing staff, transition of care team, residential hospice liaison, related to patient condition, needs, goals of care, disposition. End-of-life order set implemented.   PMT to continue to follow.   HCPOA   NEXT OF KIN - wife, Alex [name].  3 children Alex [name] and Alex [name].     SUMMARY OF RECOMMENDATIONS   Comfort and dignity at end-of-life.  Residential hospice with [place] order set implemented  Code Status/Advance Care Planning:  DNR  Symptom Management:   End-of-life order set implemented  Palliative Prophylaxis:   Frequent Pain Assessment, Palliative Wound Care and Turn Reposition  Additional Recommendations (Limitations, Scope, Preferences):  Full Comfort Care  Psycho-social/Spiritual:   Desire for further Chaplaincy support:yes  Additional Recommendations: Caregiving  Support/Resources and Education on Hospice  Prognosis:   < 2 weeks, anticipated based on severity of illness, chronic illness burden, poor functional status, family's desire to focus on comfort and dignity, let nature  take its course.  Discharge Planning: Requesting comfort and dignity at end-of-life, residential hospice.      Primary Diagnoses: Present on Admission: . Chest pain . Essential hypertension . Delirium with dementia   I have reviewed the medical record, interviewed the patient and family, and examined the patient. The following aspects are pertinent.  Past Medical History:  Diagnosis Date  . Arthritis   . CAD (coronary artery disease)    post CABG; myoview 2009 neg  . Carotid artery disease (HCC)    Mild  . Cirrhosis of  liver ([place])   . Dementia ([place])   . GERD (gastroesophageal reflux disease)   . Hepatitis C    Inactive  . Hyperlipidemia   . Hypertension   . Idiopathic thrombocytopenia ([place])   . Memory loss   . Myocardial infarction [facility]) [place]  . RBBB (right bundle branch block)   . Right bundle branch block (RBBB)   . Right tibial fracture 2014  . Stroke [facility]) February 2016   [facility]  . Thrombocytopenia ([place])   . TIA (transient ischemic attack)   . TIA (transient ischemic attack)    Social History   Socioeconomic History  . Marital status: Married    Spouse name: Not on file  . Number of children: Not on file  . Years of education: Not on file  . Highest education level: Not on file  Occupational History  . Occupation: Retired  Tobacco Use  . Smoking status: Never Smoker  . Smokeless tobacco: Never Used  Substance and Sexual Activity  . Alcohol use: No  . Drug use: No  . Sexual activity: Not on file  Other Topics Concern  . Not on file  Social History Narrative   Married   Social Determinants of Health   Financial Resource Strain: Not on file  Food Insecurity: Not on file  Transportation Needs: Not on file  Physical Activity: Not on file  Stress: Not on file  Social Connections: Not on file   Family History  Problem Relation Age of Onset  . Diabetes Neg Hx   . Coronary artery disease Neg Hx    Scheduled Meds: Continuous Infusions: . sodium chloride Stopped (10/16/20 0346)   PRN Meds:.acetaminophen **OR** acetaminophen, antiseptic oral rinse, glycopyrrolate **OR** glycopyrrolate **OR** glycopyrrolate, haloperidol **OR** haloperidol **OR** haloperidol lactate, LORazepam **OR** LORazepam **OR** LORazepam, morphine injection, ondansetron **OR** ondansetron (ZOFRAN) IV, polyvinyl alcohol Medications Prior to Admission:  Prior to Admission medications   Medication Sig Start Date End Date Taking? Authorizing Provider  aspirin 81 MG EC tablet Take 81 mg by mouth  daily.   Yes [provider]  clopidogrel (PLAVIX) 75 MG tablet Take 1 tablet by mouth daily. 09/22/20  Yes [provider]  Cyanocobalamin (B-12) 1000 MCG TBCR Take 1,000 mcg by mouth daily. 06/05/20  Yes Nicole Kindred A, DO  ezetimibe (ZETIA) 10 MG tablet Take 1 tablet (10 mg total) by mouth daily. 02/03/20 05/03/20 Yes Gollan, Kathlene November, MD  galantamine (RAZADYNE) 12 MG tablet Take 12 mg by mouth 2 (two) times daily.  02/26/13  Yes [provider]  irbesartan (AVAPRO) 300 MG tablet TAKE 1/2 TABLET EVERYDAY 07/14/20  Yes Gollan, Kathlene November, MD  levothyroxine (SYNTHROID, LEVOTHROID) 75 MCG tablet Take 75 mcg by mouth daily. 12/21/16  Yes [provider]  memantine (NAMENDA XR) 21 MG CP24 24 hr capsule Take 21 mg by mouth daily.  04/13/15  Yes [provider]  Multiple Vitamins-Minerals (CENTRUM SILVER)  tablet Take 1 tablet by mouth daily.     Yes [provider]  omeprazole (PRILOSEC) 40 MG capsule Take 40 mg by mouth daily. 10/08/17  Yes [provider]  QUEtiapine (SEROQUEL) 25 MG tablet Take 25 mg by mouth daily as needed for agitation. 05/24/20 10/14/20 Yes [provider]  sertraline (ZOLOFT) 50 MG tablet Take 50 mg by mouth daily.  03/11/15  Yes [provider]  simvastatin (ZOCOR) 40 MG tablet TAKE ONE TABLET BY MOUTH AT BEDTIME 09/09/19  Yes Gollan, Kathlene November, MD  nitroGLYCERIN (NITROSTAT) 0.4 MG SL tablet Place 1 tablet (0.4 mg total) under the tongue as directed. 10/23/16   Minna Merritts, MD   Allergies  Allergen Reactions  . Aspirin Other (See Comments)    "unknown"  . Aspirin-Dipyridamole Er Other (See Comments)    "unknown"  . Aspirin-Dipyridamole Er     aggrenox  . Ciprofloxacin Other (See Comments)    "burning sensation" Other reaction(s): OTHER  . Contrast Media [Iodinated Diagnostic Agents]     Burning when having mri contrast dye -   . Morphine Other (See Comments)    "unknown" Other reaction(s):  OTHER  . Other Other (See Comments)    Other reaction(s): Unknown  Other reaction(s): OTHER Burning when having mri contrast dye -  Other reaction(s): OTHER   . Venlafaxine Other (See Comments)    "unknown" Other reaction(s): OTHER  AKA Effexor Other reaction(s): OTHER    Review of Systems  Unable to perform ROS: Acuity of condition    Physical Exam Vitals and nursing note reviewed.  Constitutional:      General: He is not in acute distress.    Appearance: He is ill-appearing.  HENT:     Head: Atraumatic.  Cardiovascular:     Rate and Rhythm: Bradycardia present.  Pulmonary:     Effort: Pulmonary effort is normal. No respiratory distress.  Skin:    General: Skin is warm and dry.  Neurological:     Comments: Orientation questions not asked     Vital Signs: BP 115/73   Pulse (!) 57   Temp (!) 96.8 F (36 C) (Axillary)   Resp 11   Ht 5' 8"  (1.727 m)   Wt 70.1 kg   SpO2 96%   BMI 23.50 kg/m  Pain Scale: 0-10   Pain Score: Asleep   SpO2: SpO2: 96 % O2 Device:SpO2: 96 % O2 Flow Rate: .O2 Flow Rate (L/min): 2 L/min  IO: Intake/output summary:   Intake/Output Summary (Last 24 hours) at 10/17/2020 1349 Last data filed at 10/17/2020 0800 Gross per 24 hour  Intake 242.01 ml  Output 1475 ml  Net -1232.99 ml    LBM: Last BM Date: 10/16/20 Baseline Weight: Weight: 68 kg Most recent weight: Weight: 70.1 kg     Palliative Assessment/Data:   Flowsheet Rows   Flowsheet Row Most Recent Value  Intake Tab   Referral Department Hospitalist  Unit at Time of Referral Intermediate Care Unit  Palliative Care Primary Diagnosis Cardiac  Date Notified 10/15/20  Palliative Care Type New Palliative care  Reason for referral Clarify Goals of Care  Date of Admission 10/14/20  Date first seen by Palliative Care 10/17/20  # of days Palliative referral response time 2 Day(s)  # of days IP prior to Palliative referral 1  Clinical Assessment   Palliative Performance Scale  Score 20%  Pain Max last 24 hours Not able to report  Pain Min Last 24 hours Not able  to report  Dyspnea Max Last 24 Hours Not able to report  Dyspnea Min Last 24 hours Not able to report  Psychosocial & Spiritual Assessment   Palliative Care Outcomes       Time In: 1100 Time Out: 1210 Time Total: 70 minutes  Greater than 50%  of this time was spent counseling and coordinating care related to the above assessment and plan.  Signed by: Drue Novel, NP   Please contact Palliative Medicine Team phone at [phone] for questions and concerns.  For individual provider: See Shea Evans

## 2020-10-18 NOTE — Progress Notes (Signed)
[facility] Room 122 [facility]) Hospital Liaison RN note:  Visited patient and family at bedside. Hospice Home is not able to offer a room today. Hospital care team is aware. [facility] Liaison will continue to follow for room availability.  Please call with any hospice related questions or concerns.  Thank you for the opportunity to participate in this patient's care.  Cyndra Numbers, RN [facility] Liaison [phone]

## 2020-10-18 NOTE — TOC Progression Note (Signed)
Transition of Care [facility]) - Progression Note    Patient Details  Name: Alex [name] MRN: [id number] Date of Birth: 1932/11/05  Transition of Care [facility]) CM/SW Contact  Caryn Section, RN Phone Number: 10/18/2020, 3:53 PM  Clinical Narrative: Patient waiting on a bed in Hospice House.  Per Hospice Liaison, no bed available today.      Expected Discharge Plan: Hospice Medical Facility    Expected Discharge Plan and Services Expected Discharge Plan: Hospice Medical Facility In-house Referral: Clinical Social Work,Hospice / Palliative Care   Post Acute Care Choice: Hospice ([facility]) Living arrangements for the past 2 months: Single Family Home                                       Social Determinants of Health (SDOH) Interventions    Readmission Risk Interventions No flowsheet data found.

## 2020-10-18 NOTE — Progress Notes (Signed)
PROGRESS NOTE    Alex [name]  YJE:[id number] DOB: Feb 11, 1933 DOA: 10/14/2020 PCP: Danella Penton, MD   Chief complaint.  Chest pain. Brief Narrative:   Alex [name] a 87 y.o.Caucasian malewith medical history significant forcoronary artery disease status post CABG, carotid artery disease, liver cirrhosis, GERD, dementia, hypertension, dyslipidemia, CVA/TIA, who presented to the ER with acute onset of chest pain that woke him up from sleep.Upon arriving the emergency room, EKG showed atrial fibrillation with rapid ventricular response, peak troponin 3831.Patient is seen by cardiology, cardiac cathwas deferred due to multiple medical problems and a poor prognosis.  5/9.  Seen by palliative care, transition her to comfort care.  Assessment & Plan:   Active Problems:   Essential hypertension   Delirium with dementia   Chest pain   NSTEMI (non-ST elevated myocardial infarction) (HCC)   Paroxysmal atrial fibrillation with rapid ventricular response (HCC)   Acute on chronic diastolic CHF (congestive heart failure) (HCC)   Chronic kidney disease, stage 3a (HCC)   Thrombocytopenia (HCC)  Patient she also received the pain medicine, chronicity pain.  He appeared to be comfortable.  Discussed with case manager, no bed in hospice today.  Most likely will transfer to inpatient hospice tomorrow.      Status is: Inpatient  Remains inpatient appropriate because:Inpatient level of care appropriate due to severity of illness   Dispo: The patient is from: Home              Anticipated d/c is to: hospice              Patient currently is not medically stable to d/c.   Difficult to place patient No        I/O last 3 completed shifts: In: 408.5 [P.O.:240; I.V.:168.5] Out: 2200 [Urine:2200] No intake/output data recorded.     Consultants:   Cardiology  Procedures: None  Antimicrobials: None  Subjective: Patient is very sleepy, opening eyes to command.  Seem to  be comfortable, no short of breath or respite distress.  No nausea vomiting.  Objective: Vitals:   10/17/20 1700 10/17/20 1800 10/17/20 2041 10/18/20 0610  BP:   127/84 (!) 142/84  Pulse: 65 65 68 65  Resp: (!) 22 15 16 18   Temp:   99.4 F (37.4 C) 98.7 F (37.1 C)  TempSrc:   Oral Oral  SpO2: 95% 98% 98% 95%  Weight:      Height:        Intake/Output Summary (Last 24 hours) at 10/18/2020 1236 Last data filed at 10/18/2020 12/18/2020 Gross per 24 hour  Intake 240 ml  Output 1000 ml  Net -760 ml   Filed Weights   10/14/20 0245 10/15/20 0154 10/16/20 0500  Weight: 73.1 kg 69.6 kg 70.1 kg    Examination:  General exam: Appears calm and comfortable  Respiratory system: Clear to auscultation. Respiratory effort normal. Cardiovascular system: Irregular. No JVD, murmurs, rubs, gallops or clicks. No pedal edema. Gastrointestinal system: Abdomen is nondistended, soft and nontender. No organomegaly or masses felt. Normal bowel sounds heard. Central nervous system: Drowsy and confused Extremities: Symmetric  Skin: No rashes, lesions or ulcers      Data Reviewed: I have personally reviewed following labs and imaging studies  CBC: Recent Labs  Lab 10/14/20 0015 10/14/20 0505 10/15/20 0505 10/16/20 0506 10/17/20 0414  WBC 5.6 5.6 6.7 6.1 6.7  NEUTROABS 3.8  --   --   --   --   HGB 12.8* 11.7* 13.5 13.7  14.7  HCT 39.0 35.2* 39.5 41.0 43.3  MCV 91.1 91.2 87.8 90.9 89.3  PLT 73* 73* 73* 71* 80*   Basic Metabolic Panel: Recent Labs  Lab 10/14/20 0015 10/15/20 0505 10/16/20 0506 10/17/20 0414  NA 139 138 140 137  K 4.0 3.8 3.8 3.8  CL 104 102 106 100  CO2 27 27 25 28   GLUCOSE 128* 96 100* 111*  BUN 27* 24* 26* 24*  CREATININE 1.35* 1.08 1.15 1.52*  CALCIUM 8.7* 9.0 8.6* 9.1  MG  --  2.1 2.0 2.1  PHOS  --   --  3.8 4.6   GFR: Estimated Creatinine Clearance: 34.4 mL/min (A) (by C-G formula based on SCr of 1.52 mg/dL (H)). Liver Function Tests: Recent Labs  Lab  10/14/20 0015  AST 21  ALT 20  ALKPHOS 71  BILITOT 1.1  PROT 6.0*  ALBUMIN 4.1   Recent Labs  Lab 10/14/20 0015  LIPASE 29   No results for input(s): AMMONIA in the last 168 hours. Coagulation Profile: Recent Labs  Lab 10/14/20 0015  INR 1.1   Cardiac Enzymes: No results for input(s): CKTOTAL, CKMB, CKMBINDEX, TROPONINI in the last 168 hours. BNP (last 3 results) No results for input(s): PROBNP in the last 8760 hours. HbA1C: No results for input(s): HGBA1C in the last 72 hours. CBG: Recent Labs  Lab 10/16/20 0333  GLUCAP 110*   Lipid Profile: No results for input(s): CHOL, HDL, LDLCALC, TRIG, CHOLHDL, LDLDIRECT in the last 72 hours. Thyroid Function Tests: Recent Labs    10/16/20 0506  TSH 8.342*   Anemia Panel: No results for input(s): VITAMINB12, FOLATE, FERRITIN, TIBC, IRON, RETICCTPCT in the last 72 hours. Sepsis Labs: No results for input(s): PROCALCITON, LATICACIDVEN in the last 168 hours.  Recent Results (from the past 240 hour(s))  Resp Panel by RT-PCR (Flu A&B, Covid) Nasopharyngeal Swab     Status: None   Collection Time: 10/14/20 12:29 AM   Specimen: Nasopharyngeal Swab; Nasopharyngeal(NP) swabs in vial transport medium  Result Value Ref Range Status   SARS Coronavirus 2 by RT PCR NEGATIVE NEGATIVE Final    Comment: (NOTE) SARS-CoV-2 target nucleic acids are NOT DETECTED.  The SARS-CoV-2 RNA is generally detectable in upper respiratory specimens during the acute phase of infection. The lowest concentration of SARS-CoV-2 viral copies this assay can detect is 138 copies/mL. A negative result does not preclude SARS-Cov-2 infection and should not be used as the sole basis for treatment or other patient management decisions. A negative result may occur with  improper specimen collection/handling, submission of specimen other than nasopharyngeal swab, presence of viral mutation(s) within the areas targeted by this assay, and inadequate number of  viral copies(<138 copies/mL). A negative result must be combined with clinical observations, patient history, and epidemiological information. The expected result is Negative.  Fact Sheet for Patients:  12/14/20  Fact Sheet for Healthcare Providers:  BloggerCourse.com  This test is no t yet approved or cleared by the [place] FDA and  has been authorized for detection and/or diagnosis of SARS-CoV-2 by FDA under an Emergency Use Authorization (EUA). This EUA will remain  in effect (meaning this test can be used) for the duration of the COVID-19 declaration under Section 564(b)(1) of the Act, 21 U.S.C.section 360bbb-3(b)(1), unless the authorization is terminated  or revoked sooner.       Influenza A by PCR NEGATIVE NEGATIVE Final   Influenza B by PCR NEGATIVE NEGATIVE Final    Comment: (NOTE) The Xpert Xpress  SARS-CoV-2/FLU/RSV plus assay is intended as an aid in the diagnosis of influenza from Nasopharyngeal swab specimens and should not be used as a sole basis for treatment. Nasal washings and aspirates are unacceptable for Xpert Xpress SARS-CoV-2/FLU/RSV testing.  Fact Sheet for Patients: BloggerCourse.com  Fact Sheet for Healthcare Providers: SeriousBroker.it  This test is not yet approved or cleared by the [place] FDA and has been authorized for detection and/or diagnosis of SARS-CoV-2 by FDA under an Emergency Use Authorization (EUA). This EUA will remain in effect (meaning this test can be used) for the duration of the COVID-19 declaration under Section 564(b)(1) of the Act, 21 U.S.C. section 360bbb-3(b)(1), unless the authorization is terminated or revoked.  Performed at [facility], [address] [place], [place] [address]   MRSA PCR Screening     Status: None   Collection Time: 10/16/20  3:37 AM   Specimen: Nasopharyngeal  Result  Value Ref Range Status   MRSA by PCR NEGATIVE NEGATIVE Final    Comment:        The GeneXpert MRSA Assay (FDA approved for NASAL specimens only), is one component of a comprehensive MRSA colonization surveillance program. It is not intended to diagnose MRSA infection nor to guide or monitor treatment for MRSA infections. Performed at [facility], [address], [place], [place] [address]          Radiology Studies: No results found.      Scheduled Meds: Continuous Infusions: . sodium chloride Stopped (10/16/20 0346)     LOS: 3 days    Time spent: 25 minutes    Marrion Coy, MD [facility]   To contact the attending provider between 7A-7P or the covering provider during after hours 7P-7A, please log into the web site [email or web address] and access using universal  password for that web site. If you do not have the password, please call the hospital operator.  10/18/2020, 12:36 PM

## 2020-10-18 NOTE — Progress Notes (Signed)
Patient resting peacefully in room with eyes closed. Multiple family members at bedside. Family members updated on POC, agreeable.

## 2020-10-18 NOTE — Progress Notes (Signed)
Palliative: Mr. Saxer is now full comfort care.  He is lying quietly in bed surrounded by family and friends.  He appears comfortable.  I do not believe that he can make his basic needs known.  In hospital hospice representative at bedside.  We talk with wife about symptom management needs and disposition.  Orders adjusted for comfort.  Conference with attending, bedside nursing staff, transition of care team related to patient condition, needs, symptom management, disposition.  Plan: Full comfort care.  Requesting comfort and dignity at end-of-life, residential hospice in [place].  Symptom management orders adjusted.  25 minutes Lillia Carmel, NP Palliative medicine team Team phone [phone] Greater than 50% of this time was spent counseling and coordinating care related to the above assessment and plan.

## 2020-10-18 NOTE — Progress Notes (Signed)
   10/16/20 0000  Assess: MEWS Score  BP 110/67  ECG Heart Rate (!) 124  Resp 18  Level of Consciousness Alert  O2 Device Room Air  Patient Activity (if Appropriate) In bed  Assess: MEWS Score  MEWS Temp 0  MEWS Systolic 0  MEWS Pulse 2  MEWS RR 0  MEWS LOC 0  MEWS Score 2  MEWS Score Color Comfort Care Only  Assess: if the MEWS score is Yellow or Red  Were vital signs taken at a resting state? Yes  Focused Assessment Change from prior assessment (see assessment flowsheet)  Early Detection of Sepsis Score *See Row Information* Low  MEWS guidelines implemented *See Row Information* Yes  Treat  MEWS Interventions Escalated (See documentation below)  Pain Scale 0-10  Pain Score 6  Patients Stated Pain Goal 0  Pain Intervention(s) Medication (See eMAR)  Take Vital Signs  Increase Vital Sign Frequency  Yellow: Q 2hr X 2 then Q 4hr X 2, if remains yellow, continue Q 4hrs  Escalate  MEWS: Escalate Yellow: discuss with charge nurse/RN and consider discussing with provider and RRT  Notify: Charge Nurse/RN  Name of Charge Nurse/RN Advertising copywriter, RN  Date Charge Nurse/RN Notified 10/15/20  Time Charge Nurse/RN Notified 2300  Notify: Provider  Provider Name/Title Dr. Arville Care  Date Provider Notified 10/15/20  Time Provider Notified 2330  Notification Type Page (Securetext)  Notification Reason Change in status  Provider response At bedside;See new orders  Date of Provider Response 10/15/20  Time of Provider Response 2330  Document  Patient Outcome Transferred/level of care increased

## 2020-10-18 NOTE — Progress Notes (Signed)
   10/16/20 0100  Assess: MEWS Score  BP 100/77  ECG Heart Rate (!) 139  Resp 13  Level of Consciousness Alert  O2 Device Room Air  Patient Activity (if Appropriate) In bed  Assess: MEWS Score  MEWS Temp 0  MEWS Systolic 1  MEWS Pulse 3  MEWS RR 1  MEWS LOC 0  MEWS Score 5  MEWS Score Color Comfort Care Only  Assess: if the MEWS score is Yellow or Red  Were vital signs taken at a resting state? Yes  Focused Assessment Change from prior assessment (see assessment flowsheet)  Early Detection of Sepsis Score *See Row Information* Low  MEWS guidelines implemented *See Row Information* Yes  Treat  MEWS Interventions Escalated (See documentation below)  Pain Scale 0-10  Pain Score Asleep  Patients Stated Pain Goal 0  Take Vital Signs  Increase Vital Sign Frequency  Red: Q 1hr X 4 then Q 4hr X 4, if remains red, continue Q 4hrs  Escalate  MEWS: Escalate Red: discuss with charge nurse/RN and provider, consider discussing with RRT  Notify: Charge Nurse/RN  Name of Charge Nurse/RN Advertising copywriter, RN  Date Charge Nurse/RN Notified 10/16/20  Time Charge Nurse/RN Notified 0100  Notify: Provider  Provider Name/Title Dr. Arville Care  Date Provider Notified 10/16/20  Time Provider Notified 0100  Notification Type Page (Secure text)  Notification Reason Change in status  Provider response At bedside;See new orders  Date of Provider Response 10/16/20  Time of Provider Response 0100  Document  Patient Outcome Transferred/level of care increased

## 2020-10-18 NOTE — Progress Notes (Signed)
Patient resting peacefully in bed with eyes closed. No labored breathing noted. Visitor at bedside denies pt exhibiting any restlessness or signs of pain.

## 2020-10-18 NOTE — Care Management Important Message (Signed)
Important Message  Patient Details  Name: Alex [name] MRN: [id number] Date of Birth: [birth year]   Medicare Important Message Given:  Other (see comment)  Patient is on comfort care and awaiting a bed at the Hospice Home. Out of respect for the patient and family no Important Message from [facility] given.  [name] 10/18/2020, 8:04 AM

## 2020-10-19 MED ORDER — PROPOFOL 10 MG/ML IV BOLUS
INTRAVENOUS | Status: AC
  Filled 2020-10-19: qty 20

## 2020-10-19 MED ORDER — POLYVINYL ALCOHOL 1.4 % OP SOLN: 1.0000 [drp] | Freq: Four times a day (QID) | OPHTHALMIC | 0 refills | Status: AC | PRN

## 2020-10-19 MED ORDER — GLYCOPYRROLATE 0.2 MG/ML IJ SOLN: 0.2000 mg | INTRAMUSCULAR | Status: AC | PRN

## 2020-10-19 MED ORDER — BIOTENE DRY MOUTH MT LIQD: 15.0000 mL | OROMUCOSAL | Status: AC | PRN

## 2020-10-19 MED ORDER — PROPOFOL 500 MG/50ML IV EMUL
INTRAVENOUS | Status: AC
  Filled 2020-10-19: qty 50

## 2020-10-19 NOTE — TOC Transition Note (Signed)
Transition of Care [facility]) - CM/SW Discharge Note   Patient Details  Name: Alex [name] MRN: [id number] Date of Birth: 08/26/1932  Transition of [facility]) CM/SW Contact:  Caryn Section, RN Phone Number: 10/19/2020, 8:43 AM   Clinical Narrative:   Per [facility] liaison, patient has bed in hospice house today.  [facility] will transport patient at noon.  [facility] has notified family.    Final next level of care: Hospice Medical Facility     Patient Goals and CMS Choice        Discharge Placement                       Discharge Plan and Services In-house Referral: Clinical Social Work,Hospice / Palliative Care   Post Acute Care Choice: Hospice ([facility])                               Social Determinants of Health (SDOH) Interventions     Readmission Risk Interventions No flowsheet data found.

## 2020-10-19 NOTE — Progress Notes (Signed)
[facility] Room 122 [facility]) Hospital Liaison RN note:  Hospice Home is able to offer a room today. Hospital care team is aware. Spoke with spouse, Artelia Laroche, over the phone. She will sign consents at the Hospice Home at 11am and transport has been arranged for noon. I will fax the discharge summary once it is available.  Please call with any hospice related questions or concerns.  Thank you for the opportunity to participate in this patient's care.  Cyndra Numbers, RN [facility] Liaison  [phone]

## 2020-10-19 NOTE — Progress Notes (Signed)
Palliative: Mr. Nicastro is lying quietly in bed, he appears acutely/chronically ill and quite frail.  He does not open his eyes to voice or touch.  I do not believe that he can make his basic needs known.  His wife is at bedside.  We talked about symptom management.  Overall Mrs. Drummond feels like her husband is comfortable.  We talked about transfer to residential hospice.  Conference with attending, bedside nursing staff, transition of care team related to patient condition, needs, goals of care, disposition.  Plan: Full comfort care, residential hospice for comfort and dignity at end-of-life.  25 minutes Lillia Carmel, NP Palliative medicine team Team phone [phone] Greater than 50% of this time was spent counseling and coordinating care related to the above assessment and plan.

## 2020-10-19 NOTE — Discharge Summary (Signed)
Alex [name] XBM:[id number] DOB: Oct 16, 1932 DOA: 10/14/2020  PCP: Danella PentonMiller, Mark F, MD  Admit date: 10/14/2020 Discharge date: 10/19/2020  Admitted From: home Disposition:  Hospice house    Discharge Condition:Stable CODE STATUS:DNR  Diet recommendation: soft as tolerated   Brief/Interim Summary: Per UUV:OZDGUHPI:Alex Rexene EdisonH Erling ConteSwain is a 87 y.o. Caucasian male with medical history significant for coronary artery disease status post CABG, carotid artery disease, liver cirrhosis, GERD, dementia, hypertension, dyslipidemia, CVA/TIA, who presented to the ER with acute onset of chest pain that woke him up from sleep.  It apparently felt like pressure but he denied any associated palpitations.  The patient is a very poor historian due to his Alzheimer's dementia.  He was noted to be in atrial fibrillation with rapid ventricular response for which she was given 5 mg of IV Lopressor and 500 mL of IV normal saline bolus.  His heart rate dropped from 160s to 140s.  He was then given 10 mg of IV Cardizem in the ER.Patient is seen by cardiology, cardiac cathwas deferred due to multiple medical problems and a poor prognosis.Palliative care was consulted. The previous attending had a long discussion with the family and deicsion was made to go with comfort care and patient is being transferred to hospice today.  Discharge Diagnoses:  Active Problems:   Essential hypertension   Delirium with dementia   Chest pain   NSTEMI (non-ST elevated myocardial infarction) (HCC)   Paroxysmal atrial fibrillation with rapid ventricular response (HCC)   Acute on chronic diastolic CHF (congestive heart failure) (HCC)   Chronic kidney disease, stage 3a (HCC)   Thrombocytopenia (HCC)    Discharge Instructions  Discharge Instructions    Diet - low sodium heart healthy   Complete by: As directed    Increase activity slowly   Complete by: As directed      Allergies as of 10/19/2020      Reactions   Aspirin Other (See Comments)    "unknown"   Aspirin-dipyridamole Er Other (See Comments)   "unknown"   Aspirin-dipyridamole Er    aggrenox   Ciprofloxacin Other (See Comments)   "burning sensation" Other reaction(s): OTHER   Contrast Media [iodinated Diagnostic Agents]    Burning when having mri contrast dye -    Morphine Other (See Comments)   "unknown" Other reaction(s): OTHER   Other Other (See Comments)   Other reaction(s): Unknown Other reaction(s): OTHER Burning when having mri contrast dye -  Other reaction(s): OTHER   Venlafaxine Other (See Comments)   "unknown" Other reaction(s): OTHER  AKA Effexor Other reaction(s): OTHER      Medication List    STOP taking these medications   aspirin 81 MG EC tablet   B-12 1000 MCG Tbcr   Centrum Silver tablet   clopidogrel 75 MG tablet Commonly known as: PLAVIX   ezetimibe 10 MG tablet Commonly known as: ZETIA   galantamine 12 MG tablet Commonly known as: RAZADYNE   irbesartan 300 MG tablet Commonly known as: AVAPRO   levothyroxine 75 MCG tablet Commonly known as: SYNTHROID   memantine 21 MG Cp24 24 hr capsule Commonly known as: NAMENDA XR   nitroGLYCERIN 0.4 MG SL tablet Commonly known as: NITROSTAT   omeprazole 40 MG capsule Commonly known as: PRILOSEC   QUEtiapine 25 MG tablet Commonly known as: SEROQUEL   sertraline 50 MG tablet Commonly known as: ZOLOFT   simvastatin 40 MG tablet Commonly known as: ZOCOR     TAKE these medications   antiseptic oral  rinse Liqd Apply 15 mLs topically as needed for dry mouth.   glycopyrrolate 0.2 MG/ML injection Commonly known as: ROBINUL Inject 1 mL (0.2 mg total) into the vein every 4 (four) hours as needed (excessive secretions).   polyvinyl alcohol 1.4 % ophthalmic solution Commonly known as: LIQUIFILM TEARS Place 1 drop into both eyes 4 (four) times daily as needed for dry eyes.       Allergies  Allergen Reactions  . Aspirin Other (See Comments)    "unknown"  .  Aspirin-Dipyridamole Er Other (See Comments)    "unknown"  . Aspirin-Dipyridamole Er     aggrenox  . Ciprofloxacin Other (See Comments)    "burning sensation" Other reaction(s): OTHER  . Contrast Media [Iodinated Diagnostic Agents]     Burning when having mri contrast dye -   . Morphine Other (See Comments)    "unknown" Other reaction(s): OTHER  . Other Other (See Comments)    Other reaction(s): Unknown  Other reaction(s): OTHER Burning when having mri contrast dye -  Other reaction(s): OTHER   . Venlafaxine Other (See Comments)    "unknown" Other reaction(s): OTHER  AKA Effexor Other reaction(s): OTHER     Consultations:  Cardiology, palliative care, critical care   Procedures/Studies: DG Chest Port 1 View  Result Date: 10/14/2020 CLINICAL DATA:  Chest pain. EXAM: PORTABLE CHEST 1 VIEW COMPARISON:  August 19, 2015 FINDINGS: Multiple sternal wires and vascular clips are noted. Chronic appearing increased lung markings are seen. There is mild prominence of the perihilar pulmonary vasculature. There is no evidence of acute infiltrate, pleural effusion or pneumothorax. The cardiac silhouette is mildly enlarged. Degenerative changes seen throughout the thoracic spine. IMPRESSION: 1. Evidence of prior median sternotomy/CABG. 2. Cardiomegaly with mild pulmonary vascular congestion. Electronically Signed   By: Aram Candela M.D.   On: 10/14/2020 00:53   ECHOCARDIOGRAM COMPLETE  Result Date: 10/15/2020    ECHOCARDIOGRAM REPORT   Patient Name:   Alex [name] Date of Exam: 10/14/2020 Medical Rec #:  [id number]     Height:       68.0 in Accession #:    [id number]    Weight:       161.2 lb Date of Birth:  08/19/1932    BSA:          1.865 m Patient Age:    87 years      BP:           141/92 mmHg Patient Gender: M             HR:           81 bpm. Exam Location:  [facility] Procedure: 2D Echo, Cardiac Doppler and Color Doppler Indications:     NSTEMI I21.4  History:         Patient has prior  history of Echocardiogram examinations, most                  recent 06/04/2020. Previous Myocardial Infarction and CAD, TIA,                  Signs/Symptoms:Hypertensive Heart Disease; Risk                  Factors:Hypertension.  Sonographer:     Tiburcio Pea, ANN Referring Phys:  [id number] Sondra Barges Diagnosing Phys: Kristeen Miss MD  Sonographer Comments: No subcostal window. Image acquisition challenging due to patient behavioral factors. and Image acquisition challenging due to uncooperative patient. IMPRESSIONS  1. Technically difficult study  with poor image quality.  2. Left ventricular ejection fraction, by estimation, is 50 to 55%. The left ventricle has low normal function. The left ventricle demonstrates regional wall motion abnormalities (see scoring diagram/findings for description). Left ventricular diastolic  parameters are consistent with Grade II diastolic dysfunction (pseudonormalization). There is moderate hypokinesis of the left ventricular, basal-mid inferior wall and inferoseptal wall.  3. Right ventricular systolic function was not well visualized. The right ventricular size is normal.  4. The mitral valve is grossly normal. Mild mitral valve regurgitation. No evidence of mitral stenosis.  5. The aortic valve is tricuspid. Aortic valve regurgitation is trivial. No aortic stenosis is present. FINDINGS  Left Ventricle: Left ventricular ejection fraction, by estimation, is 50 to 55%. The left ventricle has low normal function. The left ventricle demonstrates regional wall motion abnormalities. Moderate hypokinesis of the left ventricular, basal-mid inferior wall and inferoseptal wall. The left ventricular internal cavity size was small. There is no left ventricular hypertrophy. Left ventricular diastolic parameters are consistent with Grade II diastolic dysfunction (pseudonormalization). Right Ventricle: The right ventricular size is normal. Right vetricular wall thickness was not well visualized. Right  ventricular systolic function was not well visualized. Left Atrium: Left atrial size was normal in size. Right Atrium: Right atrial size was normal in size. Pericardium: There is no evidence of pericardial effusion. Mitral Valve: The mitral valve is grossly normal. Mild mitral valve regurgitation. No evidence of mitral valve stenosis. Tricuspid Valve: The tricuspid valve is grossly normal. Tricuspid valve regurgitation is trivial. Aortic Valve: The aortic valve is tricuspid. Aortic valve regurgitation is trivial. No aortic stenosis is present. Aortic valve mean gradient measures 4.0 mmHg. Aortic valve peak gradient measures 7.7 mmHg. Aortic valve area, by VTI measures 2.30 cm. Pulmonic Valve: The pulmonic valve was normal in structure. Pulmonic valve regurgitation is not visualized. Aorta: The aortic root and ascending aorta are structurally normal, with no evidence of dilitation. IAS/Shunts: The atrial septum is grossly normal. Additional Comments: Technically difficult study with poor image quality.  LEFT VENTRICLE PLAX 2D LVIDd:         5.12 cm  Diastology LVIDs:         3.58 cm  LV e' medial:    3.81 cm/s LV PW:         0.97 cm  LV E/e' medial:  29.7 LV IVS:        1.09 cm  LV e' lateral:   4.90 cm/s LVOT diam:     2.00 cm  LV E/e' lateral: 23.1 LV SV:         66 LV SV Index:   35 LVOT Area:     3.14 cm  RIGHT VENTRICLE TAPSE (M-mode): 1.9 cm LEFT ATRIUM         Index LA diam:    3.70 cm 1.98 cm/m  AORTIC VALVE AV Area (Vmax):    2.46 cm AV Area (Vmean):   2.19 cm AV Area (VTI):     2.30 cm AV Vmax:           139.00 cm/s AV Vmean:          96.500 cm/s AV VTI:            0.286 m AV Peak Grad:      7.7 mmHg AV Mean Grad:      4.0 mmHg LVOT Vmax:         109.00 cm/s LVOT Vmean:        67.200 cm/s LVOT  VTI:          0.209 m LVOT/AV VTI ratio: 0.73  AORTA Ao Root diam: 3.30 cm Ao Asc diam:  2.90 cm MITRAL VALVE                TRICUSPID VALVE MV Area (PHT): 5.97 cm     TR Peak grad:   45.4 mmHg MV Decel Time:  127 msec     TR Vmax:        337.00 cm/s MV E velocity: 113.00 cm/s MV A velocity: 113.00 cm/s  SHUNTS MV E/A ratio:  1.00         Systemic VTI:  0.21 m                             Systemic Diam: 2.00 cm Kristeen Miss MD Electronically signed by Kristeen Miss MD Signature Date/Time: 10/15/2020/9:46:09 AM    Final       Subjective: No overnight issues.   Discharge Exam: Vitals:   10/19/20 0356 10/19/20 0754  BP: 139/89 (!) 114/96  Pulse: 81 72  Resp: 20 15  Temp: 98.7 F (37.1 C) 98.1 F (36.7 C)  SpO2: 96% 95%   Vitals:   10/17/20 2041 10/18/20 0610 10/19/20 0356 10/19/20 0754  BP: 127/84 (!) 142/84 139/89 (!) 114/96  Pulse: 68 65 81 72  Resp: Temp: 99.4 F (37.4 C) 98.7 F (37.1 C) 98.7 F (37.1 C) 98.1 F (36.7 C)  TempSrc: Oral Oral Oral   SpO2: 98% 95% 96% 95%  Weight:      Height:        General: NAD, half awake, calm Cardiovascular: RRR, S1/S2 +, Respiratory: CTA bilaterally, no wheezing, no rhonchi Abdominal: Soft, NT, ND, bowel sounds + Extremities: no edema    The results of significant diagnostics from this hospitalization (including imaging, microbiology, ancillary and laboratory) are listed below for reference.     Microbiology: Recent Results (from the past 240 hour(s))  Resp Panel by RT-PCR (Flu A&B, Covid) Nasopharyngeal Swab     Status: None   Collection Time: 10/14/20 12:29 AM   Specimen: Nasopharyngeal Swab; Nasopharyngeal(NP) swabs in vial transport medium  Result Value Ref Range Status   SARS Coronavirus 2 by RT PCR NEGATIVE NEGATIVE Final    Comment: (NOTE) SARS-CoV-2 target nucleic acids are NOT DETECTED.  The SARS-CoV-2 RNA is generally detectable in upper respiratory specimens during the acute phase of infection. The lowest concentration of SARS-CoV-2 viral copies this assay can detect is 138 copies/mL. A negative result does not preclude SARS-Cov-2 infection and should not be used as the sole basis for treatment or other  patient management decisions. A negative result may occur with  improper specimen collection/handling, submission of specimen other than nasopharyngeal swab, presence of viral mutation(s) within the areas targeted by this assay, and inadequate number of viral copies(<138 copies/mL). A negative result must be combined with clinical observations, patient history, and epidemiological information. The expected result is Negative.  Fact Sheet for Patients:  BloggerCourse.com  Fact Sheet for Healthcare Providers:  SeriousBroker.it  This test is no t yet approved or cleared by the [place] FDA and  has been authorized for detection and/or diagnosis of SARS-CoV-2 by FDA under an Emergency Use Authorization (EUA). This EUA will remain  in effect (meaning this test can be used) for the duration of the COVID-19 declaration under Section 564(b)(1) of the Act, 21 U.S.C.section  360bbb-3(b)(1), unless the authorization is terminated  or revoked sooner.       Influenza A by PCR NEGATIVE NEGATIVE Final   Influenza B by PCR NEGATIVE NEGATIVE Final    Comment: (NOTE) The Xpert Xpress SARS-CoV-2/FLU/RSV plus assay is intended as an aid in the diagnosis of influenza from Nasopharyngeal swab specimens and should not be used as a sole basis for treatment. Nasal washings and aspirates are unacceptable for Xpert Xpress SARS-CoV-2/FLU/RSV testing.  Fact Sheet for Patients: BloggerCourse.com  Fact Sheet for Healthcare Providers: SeriousBroker.it  This test is not yet approved or cleared by the [place] FDA and has been authorized for detection and/or diagnosis of SARS-CoV-2 by FDA under an Emergency Use Authorization (EUA). This EUA will remain in effect (meaning this test can be used) for the duration of the COVID-19 declaration under Section 564(b)(1) of the Act, 21 U.S.C. section  360bbb-3(b)(1), unless the authorization is terminated or revoked.  Performed at [facility], [address] [place], [place] [address]   MRSA PCR Screening     Status: None   Collection Time: 10/16/20  3:37 AM   Specimen: Nasopharyngeal  Result Value Ref Range Status   MRSA by PCR NEGATIVE NEGATIVE Final    Comment:        The GeneXpert MRSA Assay (FDA approved for NASAL specimens only), is one component of a comprehensive MRSA colonization surveillance program. It is not intended to diagnose MRSA infection nor to guide or monitor treatment for MRSA infections. Performed at [facility], [address] [place], [place] [address]      Labs: BNP (last 3 results) Recent Labs    10/14/20 0015  BNP 411.8*   Basic Metabolic Panel: Recent Labs  Lab 10/14/20 0015 10/15/20 0505 10/16/20 0506 10/17/20 0414  NA 139 138 140 137  K 4.0 3.8 3.8 3.8  CL 104 102 106 100  CO2 GLUCOSE 128* 96 100* 111*  BUN 27* 24* 26* 24*  CREATININE 1.35* 1.08 1.15 1.52*  CALCIUM 8.7* 9.0 8.6* 9.1  MG  --  2.1 2.0 2.1  PHOS  --   --  3.8 4.6   Liver Function Tests: Recent Labs  Lab 10/14/20 0015  AST 21  ALT 20  ALKPHOS 71  BILITOT 1.1  PROT 6.0*  ALBUMIN 4.1   Recent Labs  Lab 10/14/20 0015  LIPASE 29   No results for input(s): AMMONIA in the last 168 hours. CBC: Recent Labs  Lab 10/14/20 0015 10/14/20 0505 10/15/20 0505 10/16/20 0506 10/17/20 0414  WBC 5.6 5.6 6.7 6.1 6.7  NEUTROABS 3.8  --   --   --   --   HGB 12.8* 11.7* 13.5 13.7 14.7  HCT 39.0 35.2* 39.5 41.0 43.3  MCV 91.1 91.2 87.8 90.9 89.3  PLT 73* 73* 73* 71* 80*   Cardiac Enzymes: No results for input(s): CKTOTAL, CKMB, CKMBINDEX, TROPONINI in the last 168 hours. BNP: Invalid input(s): POCBNP CBG: Recent Labs  Lab 10/16/20 0333  GLUCAP 110*   D-Dimer No results for input(s): DDIMER in the last 72 hours. Hgb A1c No results for input(s): HGBA1C in the last 72  hours. Lipid Profile No results for input(s): CHOL, HDL, LDLCALC, TRIG, CHOLHDL, LDLDIRECT in the last 72 hours. Thyroid function studies No results for input(s): TSH, T4TOTAL, T3FREE, THYROIDAB in the last 72 hours.  Invalid input(s): FREET3 Anemia work up No results for input(s): VITAMINB12, FOLATE, FERRITIN, TIBC, IRON, RETICCTPCT in the  last 72 hours. Urinalysis    Component Value Date/Time   COLORURINE STRAW (A) 06/03/2020 2110   APPEARANCEUR CLEAR (A) 06/03/2020 2110   APPEARANCEUR Clear 04/04/2013 1500   LABSPEC 1.005 06/03/2020 2110   LABSPEC 1.008 04/04/2013 1500   PHURINE 6.0 06/03/2020 2110   GLUCOSEU NEGATIVE 06/03/2020 2110   GLUCOSEU 50 mg/dL 40/03/2724 3664   HGBUR NEGATIVE 06/03/2020 2110   BILIRUBINUR NEGATIVE 06/03/2020 2110   BILIRUBINUR Negative 04/04/2013 1500   KETONESUR NEGATIVE 06/03/2020 2110   PROTEINUR NEGATIVE 06/03/2020 2110   NITRITE NEGATIVE 06/03/2020 2110   LEUKOCYTESUR NEGATIVE 06/03/2020 2110   LEUKOCYTESUR Negative 04/04/2013 1500   Sepsis Labs Invalid input(s): PROCALCITONIN,  WBC,  LACTICIDVEN Microbiology Recent Results (from the past 240 hour(s))  Resp Panel by RT-PCR (Flu A&B, Covid) Nasopharyngeal Swab     Status: None   Collection Time: 10/14/20 12:29 AM   Specimen: Nasopharyngeal Swab; Nasopharyngeal(NP) swabs in vial transport medium  Result Value Ref Range Status   SARS Coronavirus 2 by RT PCR NEGATIVE NEGATIVE Final    Comment: (NOTE) SARS-CoV-2 target nucleic acids are NOT DETECTED.  The SARS-CoV-2 RNA is generally detectable in upper respiratory specimens during the acute phase of infection. The lowest concentration of SARS-CoV-2 viral copies this assay can detect is 138 copies/mL. A negative result does not preclude SARS-Cov-2 infection and should not be used as the sole basis for treatment or other patient management decisions. A negative result may occur with  improper specimen collection/handling, submission of  specimen other than nasopharyngeal swab, presence of viral mutation(s) within the areas targeted by this assay, and inadequate number of viral copies(<138 copies/mL). A negative result must be combined with clinical observations, patient history, and epidemiological information. The expected result is Negative.  Fact Sheet for Patients:  BloggerCourse.com  Fact Sheet for Healthcare Providers:  SeriousBroker.it  This test is no t yet approved or cleared by the [place] FDA and  has been authorized for detection and/or diagnosis of SARS-CoV-2 by FDA under an Emergency Use Authorization (EUA). This EUA will remain  in effect (meaning this test can be used) for the duration of the COVID-19 declaration under Section 564(b)(1) of the Act, 21 U.S.C.section 360bbb-3(b)(1), unless the authorization is terminated  or revoked sooner.       Influenza A by PCR NEGATIVE NEGATIVE Final   Influenza B by PCR NEGATIVE NEGATIVE Final    Comment: (NOTE) The Xpert Xpress SARS-CoV-2/FLU/RSV plus assay is intended as an aid in the diagnosis of influenza from Nasopharyngeal swab specimens and should not be used as a sole basis for treatment. Nasal washings and aspirates are unacceptable for Xpert Xpress SARS-CoV-2/FLU/RSV testing.  Fact Sheet for Patients: BloggerCourse.com  Fact Sheet for Healthcare Providers: SeriousBroker.it  This test is not yet approved or cleared by the [place] FDA and has been authorized for detection and/or diagnosis of SARS-CoV-2 by FDA under an Emergency Use Authorization (EUA). This EUA will remain in effect (meaning this test can be used) for the duration of the COVID-19 declaration under Section 564(b)(1) of the Act, 21 U.S.C. section 360bbb-3(b)(1), unless the authorization is terminated or revoked.  Performed at [facility], [address] [place], [place] [address]   MRSA PCR Screening     Status: None   Collection Time: 10/16/20  3:37 AM   Specimen: Nasopharyngeal  Result Value Ref Range Status   MRSA by PCR NEGATIVE NEGATIVE Final    Comment:  The GeneXpert MRSA Assay (FDA approved for NASAL specimens only), is one component of a comprehensive MRSA colonization surveillance program. It is not intended to diagnose MRSA infection nor to guide or monitor treatment for MRSA infections. Performed at [facility], [address], [place], [place] [address]      Time coordinating discharge: Over 30 minutes  SIGNED:   Lynn Ito, MD  [facility] 10/19/2020, 8:54 AM Pager   If 7PM-7AM, please contact night-coverage [email or web address] Password [email or web address]

## 2020-10-19 NOTE — Progress Notes (Signed)
Patient transported [facility] home by transportation,

## 2020-10-19 NOTE — Progress Notes (Signed)
Report called to hospice RN Runell Gess

## 2020-11-09 DEATH — deceased

## 2021-01-16 ENCOUNTER — Ambulatory Visit: Payer: Medicare Other | Admitting: Dermatology

## 2021-02-07 ENCOUNTER — Ambulatory Visit: Payer: Medicare Other | Admitting: Cardiovascular Disease
